# Patient Record
Sex: Female | Born: 1945 | Race: Black or African American | Hispanic: No | Marital: Married | Smoking: Former smoker
Health system: Southern US, Community
[De-identification: ages and names within clinical notes are randomized; demographics above are authoritative.]

## PROBLEM LIST (undated history)

## (undated) DIAGNOSIS — G4700 Insomnia, unspecified: Secondary | ICD-10-CM

## (undated) DIAGNOSIS — M549 Dorsalgia, unspecified: Secondary | ICD-10-CM

## (undated) DIAGNOSIS — I10 Essential (primary) hypertension: Secondary | ICD-10-CM

## (undated) DIAGNOSIS — H9319 Tinnitus, unspecified ear: Secondary | ICD-10-CM

## (undated) DIAGNOSIS — C186 Malignant neoplasm of descending colon: Secondary | ICD-10-CM

## (undated) DIAGNOSIS — J209 Acute bronchitis, unspecified: Secondary | ICD-10-CM

## (undated) DIAGNOSIS — M8580 Other specified disorders of bone density and structure, unspecified site: Secondary | ICD-10-CM

## (undated) DIAGNOSIS — G629 Polyneuropathy, unspecified: Secondary | ICD-10-CM

## (undated) DIAGNOSIS — Z0000 Encounter for general adult medical examination without abnormal findings: Principal | ICD-10-CM

## (undated) DIAGNOSIS — M542 Cervicalgia: Secondary | ICD-10-CM

## (undated) DIAGNOSIS — Z86010 Personal history of colonic polyps: Secondary | ICD-10-CM

## (undated) DIAGNOSIS — F418 Other specified anxiety disorders: Secondary | ICD-10-CM

## (undated) DIAGNOSIS — M1990 Unspecified osteoarthritis, unspecified site: Secondary | ICD-10-CM

## (undated) DIAGNOSIS — D649 Anemia, unspecified: Secondary | ICD-10-CM

## (undated) DIAGNOSIS — T7840XA Allergy, unspecified, initial encounter: Secondary | ICD-10-CM

## (undated) DIAGNOSIS — J069 Acute upper respiratory infection, unspecified: Secondary | ICD-10-CM

## (undated) DIAGNOSIS — G709 Myoneural disorder, unspecified: Secondary | ICD-10-CM

## (undated) DIAGNOSIS — E785 Hyperlipidemia, unspecified: Secondary | ICD-10-CM

## (undated) HISTORY — DX: Unspecified osteoarthritis, unspecified site: M19.90

## (undated) HISTORY — DX: Acute bronchitis, unspecified: J20.9

## (undated) HISTORY — DX: Essential (primary) hypertension: I10

## (undated) HISTORY — DX: Myoneural disorder, unspecified: G70.9

## (undated) HISTORY — DX: Personal history of colonic polyps: Z86.010

## (undated) HISTORY — DX: Hyperlipidemia, unspecified: E78.5

## (undated) HISTORY — DX: Encounter for general adult medical examination without abnormal findings: Z00.00

## (undated) HISTORY — DX: Malignant neoplasm of descending colon: C18.6

## (undated) HISTORY — DX: Cervicalgia: M54.2

## (undated) HISTORY — DX: Acute upper respiratory infection, unspecified: J06.9

## (undated) HISTORY — DX: Anemia, unspecified: D64.9

## (undated) HISTORY — DX: Other specified disorders of bone density and structure, unspecified site: M85.80

## (undated) HISTORY — DX: Allergy, unspecified, initial encounter: T78.40XA

## (undated) HISTORY — DX: Other specified anxiety disorders: F41.8

## (undated) HISTORY — DX: Polyneuropathy, unspecified: G62.9

## (undated) HISTORY — DX: Insomnia, unspecified: G47.00

---

## 1968-07-29 HISTORY — PX: TUBAL LIGATION: SHX77

## 1968-07-29 HISTORY — PX: HEMORRHOID SURGERY: SHX153

## 1972-07-29 HISTORY — PX: VAGINAL HYSTERECTOMY: SUR661

## 1985-07-29 HISTORY — PX: BREAST ENHANCEMENT SURGERY: SHX7

## 1998-05-31 ENCOUNTER — Other Ambulatory Visit: Admission: RE | Admit: 1998-05-31 | Discharge: 1998-05-31 | Payer: Self-pay | Admitting: Obstetrics and Gynecology

## 1999-07-19 ENCOUNTER — Other Ambulatory Visit: Admission: RE | Admit: 1999-07-19 | Discharge: 1999-07-19 | Payer: Self-pay | Admitting: Obstetrics and Gynecology

## 2000-06-23 ENCOUNTER — Encounter: Admission: RE | Admit: 2000-06-23 | Discharge: 2000-06-23 | Payer: Self-pay | Admitting: *Deleted

## 2000-06-23 ENCOUNTER — Encounter: Payer: Self-pay | Admitting: *Deleted

## 2000-07-25 ENCOUNTER — Other Ambulatory Visit: Admission: RE | Admit: 2000-07-25 | Discharge: 2000-07-25 | Payer: Self-pay | Admitting: Obstetrics and Gynecology

## 2001-02-02 ENCOUNTER — Ambulatory Visit (HOSPITAL_BASED_OUTPATIENT_CLINIC_OR_DEPARTMENT_OTHER): Admission: RE | Admit: 2001-02-02 | Discharge: 2001-02-02 | Payer: Self-pay | Admitting: Orthopedic Surgery

## 2001-07-28 ENCOUNTER — Other Ambulatory Visit: Admission: RE | Admit: 2001-07-28 | Discharge: 2001-07-28 | Payer: Self-pay | Admitting: Obstetrics and Gynecology

## 2001-07-29 HISTORY — PX: KNEE ARTHROSCOPY: SUR90

## 2003-05-11 ENCOUNTER — Other Ambulatory Visit: Admission: RE | Admit: 2003-05-11 | Discharge: 2003-05-11 | Payer: Self-pay | Admitting: Obstetrics and Gynecology

## 2004-07-26 ENCOUNTER — Other Ambulatory Visit: Admission: RE | Admit: 2004-07-26 | Discharge: 2004-07-26 | Payer: Self-pay | Admitting: Obstetrics and Gynecology

## 2005-08-05 ENCOUNTER — Other Ambulatory Visit: Admission: RE | Admit: 2005-08-05 | Discharge: 2005-08-05 | Payer: Self-pay | Admitting: Obstetrics and Gynecology

## 2006-08-12 ENCOUNTER — Other Ambulatory Visit: Admission: RE | Admit: 2006-08-12 | Discharge: 2006-08-12 | Payer: Self-pay | Admitting: Obstetrics and Gynecology

## 2007-11-10 ENCOUNTER — Other Ambulatory Visit: Admission: RE | Admit: 2007-11-10 | Discharge: 2007-11-10 | Payer: Self-pay | Admitting: Obstetrics and Gynecology

## 2007-11-17 ENCOUNTER — Ambulatory Visit: Payer: Self-pay | Admitting: Gastroenterology

## 2007-11-23 ENCOUNTER — Telehealth: Payer: Self-pay | Admitting: Gastroenterology

## 2008-03-23 ENCOUNTER — Ambulatory Visit: Payer: Self-pay | Admitting: Gastroenterology

## 2009-04-05 ENCOUNTER — Encounter: Payer: Self-pay | Admitting: Obstetrics and Gynecology

## 2009-04-05 ENCOUNTER — Ambulatory Visit: Payer: Self-pay | Admitting: Obstetrics and Gynecology

## 2009-04-05 ENCOUNTER — Other Ambulatory Visit: Admission: RE | Admit: 2009-04-05 | Discharge: 2009-04-05 | Payer: Self-pay | Admitting: Obstetrics and Gynecology

## 2009-07-29 HISTORY — PX: CATARACT EXTRACTION, BILATERAL: SHX1313

## 2010-08-15 ENCOUNTER — Other Ambulatory Visit: Payer: Self-pay | Admitting: Obstetrics and Gynecology

## 2010-08-15 ENCOUNTER — Ambulatory Visit
Admission: RE | Admit: 2010-08-15 | Discharge: 2010-08-15 | Payer: Self-pay | Source: Home / Self Care | Attending: Obstetrics and Gynecology | Admitting: Obstetrics and Gynecology

## 2010-08-15 ENCOUNTER — Other Ambulatory Visit
Admission: RE | Admit: 2010-08-15 | Discharge: 2010-08-15 | Payer: Self-pay | Source: Home / Self Care | Admitting: Obstetrics and Gynecology

## 2010-09-04 LAB — HM MAMMOGRAPHY: HM Mammogram: NORMAL

## 2010-11-19 ENCOUNTER — Ambulatory Visit (INDEPENDENT_AMBULATORY_CARE_PROVIDER_SITE_OTHER): Payer: 59 | Admitting: Internal Medicine

## 2010-11-19 ENCOUNTER — Encounter: Payer: Self-pay | Admitting: Internal Medicine

## 2010-11-19 VITALS — BP 132/80 | HR 106 | Ht 61.5 in | Wt 188.0 lb

## 2010-11-19 DIAGNOSIS — Z79899 Other long term (current) drug therapy: Secondary | ICD-10-CM

## 2010-11-19 DIAGNOSIS — H532 Diplopia: Secondary | ICD-10-CM

## 2010-11-19 DIAGNOSIS — H02409 Unspecified ptosis of unspecified eyelid: Secondary | ICD-10-CM

## 2010-11-20 ENCOUNTER — Other Ambulatory Visit (INDEPENDENT_AMBULATORY_CARE_PROVIDER_SITE_OTHER): Payer: 59

## 2010-11-20 ENCOUNTER — Other Ambulatory Visit: Payer: Self-pay | Admitting: Internal Medicine

## 2010-11-20 DIAGNOSIS — Z79899 Other long term (current) drug therapy: Secondary | ICD-10-CM

## 2010-11-20 LAB — CREATININE, SERUM: Creatinine, Ser: 1 mg/dL (ref 0.4–1.2)

## 2010-11-20 LAB — BUN: BUN: 16 mg/dL (ref 6–23)

## 2010-11-21 ENCOUNTER — Other Ambulatory Visit: Payer: Self-pay | Admitting: Internal Medicine

## 2010-11-21 ENCOUNTER — Telehealth: Payer: Self-pay

## 2010-11-21 ENCOUNTER — Ambulatory Visit
Admission: RE | Admit: 2010-11-21 | Discharge: 2010-11-21 | Disposition: A | Discharge status: Home / Self Care | Payer: 59 | Source: Ambulatory Visit | Attending: Internal Medicine | Admitting: Internal Medicine

## 2010-11-21 DIAGNOSIS — H532 Diplopia: Secondary | ICD-10-CM

## 2010-11-21 DIAGNOSIS — I639 Cerebral infarction, unspecified: Secondary | ICD-10-CM

## 2010-11-21 MED ORDER — GADOBENATE DIMEGLUMINE 529 MG/ML IV SOLN
17.0000 mL | Freq: Once | INTRAVENOUS | Status: AC | PRN
  Administered 2010-11-21: 17 mL via INTRAVENOUS

## 2010-11-21 NOTE — Telephone Encounter (Signed)
Needs to see neuro asap. That's why we got a work in appt. ?Terri?

## 2010-11-21 NOTE — Telephone Encounter (Signed)
Contacted [facility] and spoke with Dr. Terrace Arabia. Case presentation provided as well as cranial mri results. Neurologist recommended MRA of head and neck as well as echocardiogram. Begin  ECASA 325mg  po qd as well and requests results be faxed to neurology office upon completion to determine appt timing. Contacted pt and discussed results and neurology recommendations. Will begin asa and await scheduling of tests. States diplopia, ptosis, vertigo and eye deviation are stable without change. Has intermittent ha but not currently. No new neurologic sx's. Instructed to present to ED by EMS if develops focal weakness, speech difficulty, numbness/tingling, severe ha or worsening of visual sx's. States understanding and agreement.

## 2010-11-21 NOTE — Telephone Encounter (Signed)
Spoke with pt to notify per Dr. Rodena Medin, the MRI showed no signs that correlate with her current symptoms so she is to keep the appt with Neurology today. Pt notes that she had to reschedule the neurology appt due to scheduling conflict with the MRI appt. She is now on a waiting list with neurology to possibly be seen sooner than her scheduled appt of May 29.   Pt wants to know if she needs to keep her appt to see Dr. Rodena Medin tomorrow, 11/22/2010. Please advise.

## 2010-11-22 ENCOUNTER — Telehealth: Payer: Self-pay | Admitting: Internal Medicine

## 2010-11-22 ENCOUNTER — Ambulatory Visit: Payer: 59 | Admitting: Internal Medicine

## 2010-11-22 NOTE — Telephone Encounter (Signed)
Pt did not keep neurology appt dated 11/20/2009. Contacted neurologist office and they graciously rescheduled pt for today at 10:30am. Pt notified of appt time and date by our office as well as neurology office. Pt did not keep appointment today either. Currently attempting again to reschedule appt for evaluation. Pt left voice mail deferring further neurology appts at this time. Pt fully aware of need for urgent evaluation based on clinic discussion and ~10 minute phone discussion dated 11/20/09. I did indicate the need for further evaluation and concern for possible stroke and other neurologic illnesses. She understood the conversation and had no additional questions. Will continue to attempt neurology evaluation/appt.

## 2010-11-25 ENCOUNTER — Encounter: Payer: Self-pay | Admitting: Internal Medicine

## 2010-11-25 DIAGNOSIS — H02409 Unspecified ptosis of unspecified eyelid: Secondary | ICD-10-CM | POA: Insufficient documentation

## 2010-11-25 NOTE — Assessment & Plan Note (Signed)
With associated diplopia, vertigo and lateral right eye deviation. Otherwise neurologic is nonfocal. Symptoms are stable Schedule work in cranial MRI. Schedule neurology consult. Arrange for close followup in 3 days or sooner if necessary.

## 2010-11-25 NOTE — Progress Notes (Signed)
  Subjective:    Patient ID: Beth Maldonado, female    DOB: 1946-07-15, 65 y.o.   MRN: [id number]  HPI Patient presents to clinic for evaluation of vertigo. Note one and a half week history of nausea associated with vertigo. Denies neurologic deficit such as numbness tingling, difficulty with speech, visual acuity decrease or extremity weakness. Does note double vision for the same amount of time. States presented to urgent care and was given prescription for Biaxin for possible sinusitis. No improvement with this medication. During interview is noted patient has right lid droop and right lateraleye deviation. Patient states this has occurred over the same amount time as her vertigo. No alleviating or exacerbating factors.No other complaints.   Reviewed past medical history, past surgical history, medications, allergies, social history and family history   Review of Systems  Eyes: Positive for visual disturbance. Negative for pain.  Neurological: Positive for dizziness. Negative for tremors, seizures, syncope, speech difficulty, weakness, numbness and headaches.  All other systems reviewed and are negative.       Objective:   Physical Exam    Physical Exam  Vitals reviewed. Constitutional:  appears well-developed and well-nourished. No distress.  HENT:  Head: Normocephalic and atraumatic.  Right Ear: Tympanic membrane, external ear and ear canal normal.  Left Ear: Tympanic membrane, external ear and ear canal normal.  Nose: Nose normal.  Mouth/Throat: Oropharynx is clear and moist. No oropharyngeal exudate.  Eyes: right ptosis and right lateral eye deviation noted. Otherwise EOM grossly intact. Conjunctivae were clear and sclera are nonicteric. Pupils equal round reactive to light. Neck: Neck supple. No thyromegaly present.  Cardiovascular: Normal rate, regular rhythm and normal heart sounds.  Exam reveals no gallop and no friction rub.   No murmur heard. Pulmonary/Chest: Effort  normal and breath sounds normal. No respiratory distress.  has no wheezes.  has no rales.  Lymphadenopathy:   no cervical adenopathy.  Neurological:  is alert.  Skin: Skin is warm and dry.  not diaphoretic.  Psychiatric: normal mood and affect.  Neuro: Cranial nerves II through XII grossly intact. Gait normal.    Assessment & Plan:

## 2010-12-14 NOTE — Op Note (Signed)
[facility]  Patient:    Beth Maldonado, Beth Maldonado                   MRN: [id number] Proc. Date: 02/02/01 Adm. 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  POSTOPERATIVE DIAGNOSES: 1. Right knee medial and lateral meniscal tears. 2. Right knee chondromalacia.  PROCEDURE: 1. Right knee EUA followed by arthroscopic partial medial and lateral    meniscectomies. 2. Right knee chondroplasty.  SURGEON:  Dr. Salvatore Marvel.  ASSISTANT:  Kirstin Tomasa Rand, P.A.  ANESTHESIA:  General.  OPERATIVE TIME:  30 minutes.  COMPLICATIONS:  None.  INDICATIONS FOR PROCEDURE:  Ms. Hollenberg is a 65 year old woman who has had over a year of right knee pain persistent in nature with signs and symptoms consistent with chondromalacia, possible meniscal tear, who has failed conservative care, and is now to undergo arthroscopy.  DESCRIPTION OF PROCEDURE:  Ms. Druck was brought to the operating room on 02/02/01, placed on operative table in the supine position.  After an adequate level of general anesthesia was obtained, her right knee was examined under anesthesia.  She had full range of motion.  The knee was stable to ligamentous exam with normal patella tracking.  The right knee was sterilely injected with 0.25% Marcaine with epinephrine.  The right leg was prepped using sterile Betadine, and draped using sterile technique.  Originally, through an inferolateral portal, an arthroscope with a pump attached was placed into an inferomedial portal, an arthroscopic probe was placed.  On initial inspection of the medial compartment, the articular cartilage, medial femoral condyle, medial tibial plateau showed 25% grade III chondromalacia which was debrided.  Medial meniscus showed a complex tear posteromedial horn of which 50% was resected  back to a stable rim.  Intercondylar notch was inspected.  Anterior and posterior cruciate ligaments were normal.  Lateral compartment inspected.  Articular cartilage, lateral femoral condyle, and lateral tibial plateau showed mild grade I and II chondromalacia.  Lateral meniscus showed a small tear posterolateral inner surface 15% which was resected back to a stable rim.  Patellofemoral joint showed grade III chondromalacia over 50% of the patella which was debrided.  Grade I and II changes in the femoral groove.  The patella tracked normally.  Moderate synovitis in the medial and lateral gutters were debrided, and then the synovial bleeders were cauterized.  Otherwise, they were free of pathology. After this was done, it was felt that all pathology had been satisfactorily addressed.  The instruments were removed.  The portals were closed with 3-0 Nylon suture and injected with 0.25% Marcaine with epinephrine and 4 mg of morphine.  Sterile dressing applied.  The patient was awakened and taken to the recovery room in stable condition.  FOLLOWUP:  Ms. Hebdon will be followed as an outpatient on Vicodin and Celebrex.  See her back in my office in a week for sutures out and followup. DD:  02/02/01 TD:  02/02/01 Job: [id number]

## 2011-02-14 ENCOUNTER — Ambulatory Visit: Payer: 59 | Admitting: Internal Medicine

## 2011-02-19 ENCOUNTER — Encounter: Payer: Self-pay | Admitting: Internal Medicine

## 2011-02-20 ENCOUNTER — Ambulatory Visit (INDEPENDENT_AMBULATORY_CARE_PROVIDER_SITE_OTHER): Payer: 59 | Admitting: Internal Medicine

## 2011-02-20 ENCOUNTER — Encounter: Payer: Self-pay | Admitting: Internal Medicine

## 2011-02-20 DIAGNOSIS — Z79899 Other long term (current) drug therapy: Secondary | ICD-10-CM

## 2011-02-20 DIAGNOSIS — E785 Hyperlipidemia, unspecified: Secondary | ICD-10-CM

## 2011-02-20 DIAGNOSIS — E119 Type 2 diabetes mellitus without complications: Secondary | ICD-10-CM | POA: Insufficient documentation

## 2011-02-20 DIAGNOSIS — I10 Essential (primary) hypertension: Secondary | ICD-10-CM

## 2011-02-20 DIAGNOSIS — G529 Cranial nerve disorder, unspecified: Secondary | ICD-10-CM

## 2011-02-20 LAB — BASIC METABOLIC PANEL
Chloride: 100 mEq/L (ref 96–112)
Creat: 1.12 mg/dL — ABNORMAL HIGH (ref 0.50–1.10)
Potassium: 4.6 mEq/L (ref 3.5–5.3)
Sodium: 139 mEq/L (ref 135–145)

## 2011-02-20 LAB — CBC WITH DIFFERENTIAL/PLATELET
Basophils Absolute: 0.1 10*3/uL (ref 0.0–0.1)
Eosinophils Relative: 10 % — ABNORMAL HIGH (ref 0–5)
Lymphocytes Relative: 27 % (ref 12–46)
Neutro Abs: 6 10*3/uL (ref 1.7–7.7)
Neutrophils Relative %: 57 % (ref 43–77)
Platelets: 667 10*3/uL — ABNORMAL HIGH (ref 150–400)
RBC: 4.27 MIL/uL (ref 3.87–5.11)
RDW: 14.4 % (ref 11.5–15.5)
WBC: 10.6 10*3/uL — ABNORMAL HIGH (ref 4.0–10.5)

## 2011-02-20 LAB — HEPATIC FUNCTION PANEL
ALT: 22 U/L (ref 0–35)
AST: 21 U/L (ref 0–37)
Bilirubin, Direct: 0.1 mg/dL (ref 0.0–0.3)
Total Protein: 7.6 g/dL (ref 6.0–8.3)

## 2011-02-20 LAB — LIPID PANEL
Cholesterol: 209 mg/dL — ABNORMAL HIGH (ref 0–200)
Total CHOL/HDL Ratio: 3.6 Ratio
Triglycerides: 175 mg/dL — ABNORMAL HIGH (ref <150)

## 2011-02-20 LAB — HM COLONOSCOPY

## 2011-02-20 NOTE — Assessment & Plan Note (Signed)
Resolved. Status post negative cranial MRI and neurology evaluation

## 2011-02-20 NOTE — Assessment & Plan Note (Signed)
Obtain fasting lipid profile and liver function tests. 

## 2011-02-20 NOTE — Assessment & Plan Note (Signed)
suboptimal control. Apply dietary modification and begin regular exercise. Obtain CBC, Chem-7 and A1c. Increase Humalog 7525 dose 14 units in the morning 20 units in the evening. Increased dosages 2 units every 4 days until at target. Reviewed in detail the patient states understanding.

## 2011-02-20 NOTE — Patient Instructions (Signed)
Please schedule chem7, a1c 250.0 prior to next visit 

## 2011-02-20 NOTE — Assessment & Plan Note (Signed)
Mildly suboptimal. Recommend out patient blood pressure log to be submitted for review. 

## 2011-02-20 NOTE — Progress Notes (Signed)
  Subjective:    Patient ID: Beth Maldonado, female    DOB: [birth year], 65 y.o.   MRN: [id number]  HPI Patient presents to clinic for evaluation of diabetes mellitus. At last visit had vertigo diplopia and lid droop and right lateral eye deviation. 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 Did ultimately see neurology who agreed with cranial nerve palsy and the symptoms have most recently resolved. His blood sugar control poor with values between 150s and low 200s without hypoglycemia. Morning blood sugars are typically in the 200 range in the p.m. Blood sugars in the 140s range without hypoglycemia. Notes increased stress contributing. Also has been eating at night. Blood pressure mildly elevated and reviewed with patient. No other complaints.  Reviewed past medical history, medications and allergies    Review of Systems see history of present illness     Objective:   Physical Exam    Physical Exam  Vitals reviewed. Constitutional:  appears well-developed and well-nourished. No distress.  HENT:  Head: Normocephalic and atraumatic.  Nose: Nose normal.  Eyes: Conjunctivae and EOM are normal. Pupils are equal, round, and reactive to light. Right eye exhibits no discharge. Left eye exhibits no discharge. No scleral icterus.  Neck: Neck supple. No thyromegaly present.  Cardiovascular: Normal rate, regular rhythm and normal heart sounds.  Exam reveals no gallop and no friction rub.   No murmur heard. Pulmonary/Chest: Effort normal and breath sounds normal. No respiratory distress.  has no wheezes.  has no rales.  Lymphadenopathy:   no cervical adenopathy.  Neurological:  is alert.  Skin: Skin is warm and dry.  not diaphoretic.  Psychiatric: normal mood and affect.      Assessment & Plan:

## 2011-02-21 LAB — MICROALBUMIN / CREATININE URINE RATIO: Microalb, Ur: 0.51 mg/dL (ref 0.00–1.89)

## 2011-02-21 LAB — HEMOGLOBIN A1C: Mean Plasma Glucose: 180 mg/dL — ABNORMAL HIGH (ref <117)

## 2011-03-05 ENCOUNTER — Other Ambulatory Visit: Payer: Self-pay | Admitting: *Deleted

## 2011-03-05 MED ORDER — INSULIN LISPRO PROT & LISPRO (75-25 MIX) 100 UNIT/ML ~~LOC~~ SUSP: SUBCUTANEOUS | Status: DC

## 2011-03-05 MED ORDER — HYDROCHLOROTHIAZIDE 25 MG PO TABS: 25.0000 mg | ORAL_TABLET | Freq: Every day | ORAL | Status: DC

## 2011-03-05 MED ORDER — HYDROXYZINE HCL 25 MG PO TABS: 25.0000 mg | ORAL_TABLET | Freq: Two times a day (BID) | ORAL | Status: DC

## 2011-03-05 MED ORDER — PAROXETINE HCL ER 25 MG PO TB24: 25.0000 mg | ORAL_TABLET | ORAL | Status: DC

## 2011-03-05 MED ORDER — LOSARTAN POTASSIUM 100 MG PO TABS: 100.0000 mg | ORAL_TABLET | Freq: Every day | ORAL | Status: DC

## 2011-03-05 MED ORDER — METFORMIN HCL 500 MG PO TABS: ORAL_TABLET | ORAL | Status: DC

## 2011-03-05 NOTE — Telephone Encounter (Signed)
Rx refills have been sent to pharmacy

## 2011-03-05 NOTE — Telephone Encounter (Signed)
Patient called requesting refills on all of her medications.  Is it okay to refill the medications entered for patient

## 2011-03-05 NOTE — Telephone Encounter (Signed)
ok 

## 2011-03-08 ENCOUNTER — Telehealth: Payer: Self-pay | Admitting: *Deleted

## 2011-03-08 NOTE — Telephone Encounter (Signed)
Voice message received from Lauren at [employer] on behalf of patient. Her message stated the copay for the Paxil will cost the patient $88.00,and the patient is requesting an alternative to something that is less expensive.

## 2011-03-11 MED ORDER — PAROXETINE HCL 10 MG PO TABS: 10.0000 mg | ORAL_TABLET | Freq: Two times a day (BID) | ORAL | Status: DC

## 2011-03-11 MED ORDER — PAROXETINE HCL 20 MG PO TABS: 20.0000 mg | ORAL_TABLET | Freq: Every day | ORAL | Status: DC

## 2011-03-11 NOTE — Telephone Encounter (Signed)
Call placed to patient at [phone], no answer. A detailed voice message was left informing patient of medication change. Message was left for patient to call back if any questions.  Call placed to [facility] at [phone] with  Selena Batten she stated patient does not have any insurance and uses her [facility] card for discount. Kim the pharmacist decided it was more cost effective for the patient to be prescribed 10 mg twice a day. She is aware okay to change Rx to reflect.

## 2011-03-11 NOTE — Telephone Encounter (Signed)
Can try and change to plain paxil 20mg  po qd and see if price better

## 2011-05-06 ENCOUNTER — Telehealth: Payer: Self-pay | Admitting: *Deleted

## 2011-05-06 NOTE — Telephone Encounter (Signed)
Patient called and left voice message stating she has requested diabetic testing supplies for [employer] from a medical supply and wanted to know if Dr. Rodena Medin would approve the request. She also states in her message that the Paxil is not working for her.

## 2011-05-06 NOTE — Telephone Encounter (Signed)
Close alternative to paxil that is affordable would be celexa 40mg  po qd. Available on [facility] $4 list

## 2011-05-06 NOTE — Telephone Encounter (Signed)
Call placed to patient at [phone], she stated she is not sleeping well, she is still having bad night sweats, and she is staying hot. She would like to know if there is something else that she could take other than the Paxil. She states that she is unable to afford the regular or extended release.

## 2011-05-07 NOTE — Telephone Encounter (Signed)
Patient returned call she was advised per Dr Rodena Medin instructions. She stated that she will remain on Paxil and will follow up with Dr Rodena Medin on her office visit on the 26 th of October. She wanted to make sure the Celexa was similar to  Paxil and or Paxil CR.   No medication changes have been made.

## 2011-05-07 NOTE — Telephone Encounter (Signed)
Call placed to patient at [phone] no answer. Voice message left for patient to return phone call.

## 2011-05-09 ENCOUNTER — Telehealth: Payer: Self-pay | Admitting: Internal Medicine

## 2011-05-09 MED ORDER — INSULIN PEN NEEDLE 32G X 4 MM MISC: Status: DC

## 2011-05-09 NOTE — Telephone Encounter (Signed)
Rx for pen needles sent to pharmacy. 

## 2011-05-09 NOTE — Telephone Encounter (Signed)
Pharmacy comments: patient out of needles for humalog mix kwik pen- gets thru mail- can we get rx for pen needles to bill insurance? She bought box of BD ultra fine Nano.  NDC [phone]

## 2011-05-21 ENCOUNTER — Encounter: Payer: Self-pay | Admitting: Internal Medicine

## 2011-05-21 ENCOUNTER — Ambulatory Visit (INDEPENDENT_AMBULATORY_CARE_PROVIDER_SITE_OTHER): Payer: 59 | Admitting: Internal Medicine

## 2011-05-21 DIAGNOSIS — E119 Type 2 diabetes mellitus without complications: Secondary | ICD-10-CM

## 2011-05-21 DIAGNOSIS — F419 Anxiety disorder, unspecified: Secondary | ICD-10-CM

## 2011-05-21 DIAGNOSIS — F411 Generalized anxiety disorder: Secondary | ICD-10-CM

## 2011-05-21 DIAGNOSIS — E785 Hyperlipidemia, unspecified: Secondary | ICD-10-CM

## 2011-05-21 DIAGNOSIS — Z23 Encounter for immunization: Secondary | ICD-10-CM

## 2011-05-21 LAB — LIPID PANEL
HDL: 50 mg/dL (ref >39)
LDL Cholesterol: 98 mg/dL (ref 0–99)
Total CHOL/HDL Ratio: 3.7 Ratio

## 2011-05-21 LAB — BASIC METABOLIC PANEL
BUN: 13 mg/dL (ref 6–23)
Chloride: 104 mEq/L (ref 96–112)
Glucose, Bld: 143 mg/dL — ABNORMAL HIGH (ref 70–99)
Potassium: 4.3 mEq/L (ref 3.5–5.3)

## 2011-05-21 MED ORDER — PAROXETINE HCL 40 MG PO TABS: 40.0000 mg | ORAL_TABLET | Freq: Every day | ORAL | Status: DC

## 2011-05-21 NOTE — Progress Notes (Signed)
  Subjective:    Patient ID: Beth Maldonado, female    DOB: 1945-10-17, 65 y.o.   MRN: [id number]  HPI Pt presents to clinic for followup of multiple medical problems. Has improved diabetic control with typical fsbs 150 or less. Occasionally noted ~69. Cholesterol above goal and deferred statin tx however states if remains elevated will reconsider. paxil not as effective as paxil cr but less costly. Notes decreased sleep and increased hot flashes with paxil. No other complaints.  Past Medical History  Diagnosis Date  . Hypertension   . Diabetes mellitus    Past Surgical History  Procedure Date  . Brain surgery 1987    reduction  . Abdominal hysterectomy 1985    partial  . Cataract extraction, bilateral   . Laparoscopy     rt knee    reports that she has quit smoking. She has never used smokeless tobacco. She reports that she does not drink alcohol or use illicit drugs. family history includes Arthritis in her sisters; Diabetes in her mother and sisters; Heart attack in her father; Sickle cell anemia in her sister; and Thyroid disease in her sister. Allergies  Allergen Reactions  . Penicillins      Review of Systems see hpi    Objective:   Physical Exam  Physical Exam  Nursing note and vitals reviewed. Constitutional: Appears well-developed and well-nourished. No distress.  HENT:  Head: Normocephalic and atraumatic.  Right Ear: External ear normal.  Left Ear: External ear normal.  Eyes: Conjunctivae are normal. No scleral icterus.  Neck: Neck supple. Carotid bruit is not present.  Cardiovascular: Normal rate, regular rhythm and normal heart sounds.  Exam reveals no gallop and no friction rub.   No murmur heard. Pulmonary/Chest: Effort normal and breath sounds normal. No respiratory distress. He has no wheezes. no rales.  Lymphadenopathy:    He has no cervical adenopathy.  Neurological:Alert.  Skin: Skin is warm and dry. Not diaphoretic.  Psychiatric: Has a normal  mood and affect.   Diabetic foot exam: +2 DP pulses, no diabetic wounds, ulcerations or significant callousing. Monofilament exam nl.      Assessment & Plan:

## 2011-05-21 NOTE — Assessment & Plan Note (Signed)
Repeat lipid profile. If ldl remains elevated readdress possible statin tx.

## 2011-05-21 NOTE — Assessment & Plan Note (Signed)
Increase paxil 40mg  po qd.

## 2011-05-21 NOTE — Patient Instructions (Signed)
Please schedule chem7, a1c 250.0 prior to next visit 

## 2011-05-21 NOTE — Assessment & Plan Note (Signed)
Improving control. Obtain chem7 and a1c. 

## 2011-07-09 ENCOUNTER — Other Ambulatory Visit: Payer: Self-pay | Admitting: Internal Medicine

## 2011-07-09 NOTE — Telephone Encounter (Signed)
Refill sent to pharmacy office visit needed for future refills.

## 2011-08-13 ENCOUNTER — Telehealth: Payer: Self-pay | Admitting: Internal Medicine

## 2011-08-13 ENCOUNTER — Telehealth: Payer: Self-pay | Admitting: *Deleted

## 2011-08-13 MED ORDER — METFORMIN HCL 500 MG PO TABS: 1000.0000 mg | ORAL_TABLET | Freq: Two times a day (BID) | ORAL | Status: DC

## 2011-08-13 NOTE — Telephone Encounter (Signed)
Opened in error

## 2011-08-13 NOTE — Telephone Encounter (Signed)
Rx refill sent to pharmacy. Patient due office visit.

## 2011-08-26 ENCOUNTER — Telehealth: Payer: Self-pay | Admitting: Internal Medicine

## 2011-08-26 MED ORDER — INSULIN LISPRO PROT & LISPRO (75-25 MIX) 100 UNIT/ML ~~LOC~~ SUSP: SUBCUTANEOUS | Status: DC

## 2011-08-26 NOTE — Telephone Encounter (Signed)
Rx refill sent to pharmacy. Patient due for office visit 

## 2011-08-27 ENCOUNTER — Other Ambulatory Visit: Payer: Self-pay | Admitting: Internal Medicine

## 2011-08-27 NOTE — Telephone Encounter (Signed)
Refill sent to [facility] on 08/26/11. Current request for humalog quick pen denied. Should have refills on file.

## 2011-08-28 ENCOUNTER — Telehealth: Payer: Self-pay | Admitting: Internal Medicine

## 2011-08-28 NOTE — Telephone Encounter (Signed)
The

## 2011-09-19 ENCOUNTER — Other Ambulatory Visit: Payer: Self-pay | Admitting: Internal Medicine

## 2011-09-19 NOTE — Telephone Encounter (Signed)
Rx refill sent to pharmacy qty of 60 office visit is required for refills. Last office visit July 2012.

## 2011-10-09 ENCOUNTER — Other Ambulatory Visit: Payer: Self-pay | Admitting: Internal Medicine

## 2011-10-09 NOTE — Telephone Encounter (Signed)
Call placed to patient at [phone] she was advised office visit with blood work past due. She has scheduled follow up for November 07, 2011 @ 1:30p with Dr Rodena Medin. Rx refill sent to pharmacy.

## 2011-11-06 ENCOUNTER — Other Ambulatory Visit: Payer: Self-pay | Admitting: *Deleted

## 2011-11-06 DIAGNOSIS — E119 Type 2 diabetes mellitus without complications: Secondary | ICD-10-CM | POA: Diagnosis not present

## 2011-11-07 ENCOUNTER — Ambulatory Visit: Payer: 59 | Admitting: Internal Medicine

## 2011-11-07 DIAGNOSIS — E119 Type 2 diabetes mellitus without complications: Secondary | ICD-10-CM | POA: Diagnosis not present

## 2011-11-07 LAB — HEMOGLOBIN A1C
Hgb A1c MFr Bld: 8 % — ABNORMAL HIGH (ref <5.7)
Mean Plasma Glucose: 183 mg/dL — ABNORMAL HIGH (ref <117)

## 2011-11-07 LAB — BASIC METABOLIC PANEL
Calcium: 9.1 mg/dL (ref 8.4–10.5)
Sodium: 139 mEq/L (ref 135–145)

## 2011-11-08 ENCOUNTER — Telehealth: Payer: Self-pay | Admitting: Internal Medicine

## 2011-11-08 MED ORDER — INSULIN PEN NEEDLE 32G X 4 MM MISC: Status: DC

## 2011-11-08 NOTE — Telephone Encounter (Signed)
Rx refill sent to pharmacy. 

## 2011-11-08 NOTE — Telephone Encounter (Signed)
Refill- BD ultra fine nano 32/4G/MM ea. Use for insulin injection twice a day 250.00. Qty 100 last fill 12.28.12  Pharmacy comments: need ASAP. Patient is out.

## 2011-11-13 ENCOUNTER — Encounter: Payer: Self-pay | Admitting: Internal Medicine

## 2011-11-13 ENCOUNTER — Ambulatory Visit (INDEPENDENT_AMBULATORY_CARE_PROVIDER_SITE_OTHER): Payer: 59 | Admitting: Internal Medicine

## 2011-11-13 VITALS — BP 150/92 | HR 106 | Temp 98.4°F | Ht 61.5 in | Wt 196.0 lb

## 2011-11-13 DIAGNOSIS — W19XXXA Unspecified fall, initial encounter: Secondary | ICD-10-CM

## 2011-11-13 DIAGNOSIS — F411 Generalized anxiety disorder: Secondary | ICD-10-CM

## 2011-11-13 DIAGNOSIS — F419 Anxiety disorder, unspecified: Secondary | ICD-10-CM

## 2011-11-13 DIAGNOSIS — I10 Essential (primary) hypertension: Secondary | ICD-10-CM

## 2011-11-13 DIAGNOSIS — E119 Type 2 diabetes mellitus without complications: Secondary | ICD-10-CM

## 2011-11-13 MED ORDER — PAROXETINE HCL ER 37.5 MG PO TB24: 37.5000 mg | ORAL_TABLET | ORAL | Status: DC

## 2011-11-17 DIAGNOSIS — R296 Repeated falls: Secondary | ICD-10-CM | POA: Insufficient documentation

## 2011-11-17 DIAGNOSIS — W19XXXA Unspecified fall, initial encounter: Secondary | ICD-10-CM | POA: Insufficient documentation

## 2011-11-17 NOTE — Assessment & Plan Note (Signed)
Resume medication. Monitor bp as outpt

## 2011-11-17 NOTE — Progress Notes (Signed)
  Subjective:    Patient ID: Beth Maldonado, female    DOB: [birth year], 66 y.o.   MRN: [id number]  HPI Pt presents to clinic for followup of multiple medical problems. Failing paxil with increasing stress. Also has sweats with paxil but tolerated and had better control of anxiety with paxil cr. Notes intermittent falls without syncope, dizziness, neurologic deficit,  accident, joint instability or other triggers. Poor historian and is unable to elaborate or provide any detail of falls. Blood sugar avg sl improved but suboptimal. Admits to nighttime snacks. fsbs 150-200 generally. No hypoglycemia. BP elevated but off hctz x one month.  Past Medical History  Diagnosis Date  . Hypertension   . Diabetes mellitus    Past Surgical History  Procedure Date  . Brain surgery 1987    reduction  . Abdominal hysterectomy 1985    partial  . Cataract extraction, bilateral   . Laparoscopy     rt knee    reports that she has quit smoking. She has never used smokeless tobacco. She reports that she does not drink alcohol or use illicit drugs. family history includes Arthritis in her sisters; Diabetes in her mother and sisters; Heart attack in her father; Sickle cell anemia in her sister; and Thyroid disease in her sister. Allergies  Allergen Reactions  . Penicillins       Review of Systems see hpi     Objective:   Physical Exam  Physical Exam  Nursing note and vitals reviewed. Constitutional: Appears well-developed and well-nourished. No distress.  HENT:  Head: Normocephalic and atraumatic.  Right Ear: External ear normal.  Left Ear: External ear normal.  Eyes: Conjunctivae are normal. No scleral icterus.  Neck: Neck supple. Carotid bruit is not present.  Cardiovascular: Normal rate, regular rhythm and normal heart sounds.  Exam reveals no gallop and no friction rub.   No murmur heard. Pulmonary/Chest: Effort normal and breath sounds normal. No respiratory distress. He has no wheezes.  no rales.  Lymphadenopathy:    He has no cervical adenopathy.  Neurological:Alert.  Skin: Skin is warm and dry. Not diaphoretic.  Psychiatric: Has a normal mood and affect.        Assessment & Plan:

## 2011-11-17 NOTE — Assessment & Plan Note (Signed)
Failing paxil immediate release. reattempt paxil cr. F/u one month

## 2011-11-17 NOTE — Assessment & Plan Note (Signed)
suboptimal control. Stop nightime snacks. Begin exercise program. Increase pm insulin to 30 units.

## 2011-11-17 NOTE — Assessment & Plan Note (Signed)
Difficult history without obivous etiology. Discussed potential referral and pt declines

## 2011-11-18 ENCOUNTER — Telehealth: Payer: Self-pay | Admitting: Internal Medicine

## 2011-11-18 MED ORDER — LOSARTAN POTASSIUM 100 MG PO TABS: 100.0000 mg | ORAL_TABLET | Freq: Every day | ORAL | Status: DC

## 2011-11-18 NOTE — Telephone Encounter (Signed)
Rx refill sent to pharmacy. 

## 2011-11-18 NOTE — Telephone Encounter (Signed)
Refill-losartan 100mg  tab. Take one tablet(100mg  total) by mouth daily. Qty 30 last fill 3.22.13  Pharmacy comments: cycle fill medication. Authorization is required for next refill. No refills available.

## 2011-11-27 ENCOUNTER — Other Ambulatory Visit: Payer: Self-pay | Admitting: Internal Medicine

## 2011-11-27 NOTE — Telephone Encounter (Signed)
Rx refill sent to pharmacy. 

## 2011-12-10 ENCOUNTER — Ambulatory Visit: Payer: 59 | Admitting: Internal Medicine

## 2011-12-11 ENCOUNTER — Telehealth: Payer: Self-pay | Admitting: Internal Medicine

## 2011-12-11 MED ORDER — HYDROCHLOROTHIAZIDE 25 MG PO TABS: 25.0000 mg | ORAL_TABLET | Freq: Every day | ORAL | Status: DC

## 2011-12-11 MED ORDER — HYDROXYZINE HCL 25 MG PO TABS: 25.0000 mg | ORAL_TABLET | Freq: Two times a day (BID) | ORAL | Status: DC

## 2011-12-11 NOTE — Telephone Encounter (Signed)
Hydroxyzine hcl 25mg  tab. Take one tablet(25mg  total) by mouth two times daily. Qty 60 last fill 4.12.13   Hydrochlorothiazide 25mg  tab. Take one tablet(25mg  total) by mouth daily. Qty 30 last fill 4.12.13

## 2011-12-18 ENCOUNTER — Encounter: Payer: Self-pay | Admitting: Internal Medicine

## 2011-12-18 ENCOUNTER — Ambulatory Visit (INDEPENDENT_AMBULATORY_CARE_PROVIDER_SITE_OTHER): Payer: MEDICARE | Admitting: Internal Medicine

## 2011-12-18 ENCOUNTER — Telehealth: Payer: Self-pay | Admitting: Internal Medicine

## 2011-12-18 VITALS — BP 140/90 | HR 90 | Temp 98.3°F | Resp 16

## 2011-12-18 DIAGNOSIS — F419 Anxiety disorder, unspecified: Secondary | ICD-10-CM

## 2011-12-18 DIAGNOSIS — E785 Hyperlipidemia, unspecified: Secondary | ICD-10-CM

## 2011-12-18 DIAGNOSIS — E119 Type 2 diabetes mellitus without complications: Secondary | ICD-10-CM

## 2011-12-18 DIAGNOSIS — F411 Generalized anxiety disorder: Secondary | ICD-10-CM

## 2011-12-18 NOTE — Telephone Encounter (Signed)
Please schedule fasting labs prior to next visit  Chem7, a1c, urine microalbumin 250.0 and lipid/lft 272.4  Patient has upcoming follow up on 03/26/12. Patient will be going to [facility] lab.

## 2011-12-18 NOTE — Progress Notes (Signed)
  Subjective:    Patient ID: Beth Maldonado, female    DOB: [birth year], 66 y.o.   MRN: [id number]  HPI Pt presents to clinic for follow up of anxiety. Feels improved since change from paxil to paxil cr. No side effects. Feels less hot flashes and is sleeping better despite recent stressors. BP improved and is taking medication on more regular basis. Beginning exercise and decreasing snacks.  Past Medical History  Diagnosis Date  . Hypertension   . Diabetes mellitus    Past Surgical History  Procedure Date  . Brain surgery 1987    reduction  . Abdominal hysterectomy 1985    partial  . Cataract extraction, bilateral   . Laparoscopy     rt knee    reports that she has quit smoking. She has never used smokeless tobacco. She reports that she does not drink alcohol or use illicit drugs. family history includes Arthritis in her sisters; Diabetes in her mother and sisters; Heart attack in her father; Sickle cell anemia in her sister; and Thyroid disease in her sister. Allergies  Allergen Reactions  . Penicillins      Review of Systems see hpi     Objective:   Physical Exam  Nursing note and vitals reviewed. Constitutional: She appears well-developed and well-nourished. No distress.  HENT:  Head: Normocephalic and atraumatic.  Right Ear: External ear normal.  Left Ear: External ear normal.  Eyes: Conjunctivae are normal. No scleral icterus.  Neurological: She is alert.  Skin: She is not diaphoretic.  Psychiatric: She has a normal mood and affect.          Assessment & Plan:

## 2011-12-18 NOTE — Assessment & Plan Note (Signed)
Improved. Continue current paxil cr dosing.

## 2011-12-18 NOTE — Patient Instructions (Signed)
Please schedule fasting labs prior to next visit Chem7, a1c, urine microalbumin 250.0 and lipid/lft-272.4 

## 2011-12-18 NOTE — Telephone Encounter (Signed)
Lab order entered for August 2013. 

## 2011-12-18 NOTE — Assessment & Plan Note (Signed)
Encouraged appropriate diet, regular exercise and wt loss

## 2011-12-26 ENCOUNTER — Other Ambulatory Visit: Payer: Self-pay | Admitting: Internal Medicine

## 2011-12-26 ENCOUNTER — Encounter: Payer: Self-pay | Admitting: Internal Medicine

## 2011-12-26 MED ORDER — LOSARTAN POTASSIUM 100 MG PO TABS: 100.0000 mg | ORAL_TABLET | Freq: Every day | ORAL | Status: DC

## 2011-12-26 MED ORDER — PAROXETINE HCL ER 37.5 MG PO TB24: 37.5000 mg | ORAL_TABLET | ORAL | Status: DC

## 2011-12-26 MED ORDER — HYDROXYZINE HCL 25 MG PO TABS: 25.0000 mg | ORAL_TABLET | Freq: Two times a day (BID) | ORAL | Status: DC

## 2011-12-26 MED ORDER — HYDROCHLOROTHIAZIDE 25 MG PO TABS: 25.0000 mg | ORAL_TABLET | Freq: Every day | ORAL | Status: DC

## 2011-12-26 MED ORDER — METFORMIN HCL 500 MG PO TABS: 500.0000 mg | ORAL_TABLET | Freq: Two times a day (BID) | ORAL | Status: DC

## 2011-12-26 MED ORDER — INSULIN LISPRO PROT & LISPRO (75-25 MIX) 100 UNIT/ML ~~LOC~~ SUSP: SUBCUTANEOUS | Status: DC

## 2012-01-27 ENCOUNTER — Other Ambulatory Visit: Payer: Self-pay | Admitting: Internal Medicine

## 2012-02-03 ENCOUNTER — Telehealth: Payer: Self-pay | Admitting: *Deleted

## 2012-02-03 MED ORDER — METFORMIN HCL 500 MG PO TABS: ORAL_TABLET | ORAL | Status: DC

## 2012-02-03 NOTE — Telephone Encounter (Signed)
Received call from pt stating she received metformin from mail order and it is for 500mg  1 tablet twice a day. Pt states she has been taking 2 tablets twice a day and is requesting that we send refill with corrected dose and quantity. Pt also requests Humalog quick pen instead of vials. I asked pt to call [facility] and find out if they will take the vials back. Pt will call me tomorrow so we can determine how to proceed. Please advise re: metformin dose/directions.

## 2012-02-03 NOTE — Telephone Encounter (Signed)
Ok to change dosing. Pens are more likely to be denied by insurance but can try

## 2012-02-03 NOTE — Telephone Encounter (Signed)
Metformin directions changed and refill sent to [facility]. Pt called back stating she will not be able to return the Humalog vials. Advised pt we can give her some insulin syringes to use for injection until she is due for refill and we will change to quickpen. Pt is agreeable and will come by the office tomorrow for instructions on syringes.

## 2012-02-05 ENCOUNTER — Telehealth: Payer: Self-pay | Admitting: *Deleted

## 2012-02-05 NOTE — Telephone Encounter (Signed)
Received call from pt stating she continues to have multiple hot flashes daily. Has to change clothes 2-3 x daily. Feels irritable. Doesn't feel her current dose of Paxil CR is strong enough.  Please advise.

## 2012-02-05 NOTE — Telephone Encounter (Signed)
Spoke with pt and advised her that we will be unable to accept or buy back the insulin vials that she received but we could provide her with samples while we have them if she is unable to use a syringe. Pt states she does not like needles and it took her a long time to agree to use the insulin pen. Pt is agreeable to pick up kwikpen samples (4 boxes) and will hold on to her vials to use if samples are not available.

## 2012-02-05 NOTE — Telephone Encounter (Signed)
Pt left message yesterday stating she was unable to come in for instruction on use of insulin syringe and is requesting to speak with supervisor re: payment of insulin received. We have samples available in the office now that we can offer pt.  Left message on pt's home # at 8:15am to return my call.

## 2012-02-06 ENCOUNTER — Telehealth: Payer: Self-pay | Admitting: Internal Medicine

## 2012-02-06 MED ORDER — PAROXETINE HCL ER 25 MG PO TB24: 25.0000 mg | ORAL_TABLET | ORAL | Status: DC

## 2012-02-06 NOTE — Telephone Encounter (Signed)
Notified pt, she is agreeable to proceed with additional strength. Rx sent to pharmacy.

## 2012-02-06 NOTE — Telephone Encounter (Signed)
Patient states that she had talked to [place] earlier today. She states that her car is in the shop and she will not make it by here to pick up samples until tomorrow morning. Patient says that she will be here at 8:00.

## 2012-02-06 NOTE — Telephone Encounter (Signed)
If taking paxil cr 37.5mg  qd then can add additional 25mg  CR daily. Would try and avoid anything higher

## 2012-03-03 ENCOUNTER — Telehealth: Payer: Self-pay | Admitting: *Deleted

## 2012-03-03 MED ORDER — INSULIN LISPRO PROT & LISPRO (75-25 MIX) 100 UNIT/ML ~~LOC~~ SUSP: SUBCUTANEOUS | Status: DC

## 2012-03-03 NOTE — Telephone Encounter (Signed)
Received message from pt that she is opening her last box of humalog 75/25 kwik pens and is requesting additional samples. 3 boxes placed in refrigerator for pt to pick up and pt has been notified.

## 2012-03-14 ENCOUNTER — Other Ambulatory Visit: Payer: Self-pay | Admitting: Internal Medicine

## 2012-03-16 NOTE — Telephone Encounter (Signed)
Paxil CR 25 mg request, added to Rx regimen [Paxil 37.5 mg due to hot flashes per [facility]] 07.11.13 #30x0/SLS Please advise.

## 2012-03-17 ENCOUNTER — Other Ambulatory Visit: Payer: Self-pay | Admitting: Internal Medicine

## 2012-03-17 NOTE — Telephone Encounter (Signed)
Denied-both meds already responded to by other means/SLS

## 2012-03-18 ENCOUNTER — Other Ambulatory Visit: Payer: Self-pay | Admitting: Internal Medicine

## 2012-03-26 ENCOUNTER — Encounter: Payer: Self-pay | Admitting: Internal Medicine

## 2012-03-26 ENCOUNTER — Ambulatory Visit (INDEPENDENT_AMBULATORY_CARE_PROVIDER_SITE_OTHER): Payer: 59 | Admitting: Internal Medicine

## 2012-03-26 DIAGNOSIS — I10 Essential (primary) hypertension: Secondary | ICD-10-CM

## 2012-03-26 DIAGNOSIS — E119 Type 2 diabetes mellitus without complications: Secondary | ICD-10-CM | POA: Diagnosis not present

## 2012-03-26 LAB — BASIC METABOLIC PANEL
BUN: 10 mg/dL (ref 6–23)
CO2: 29 mEq/L (ref 19–32)
Chloride: 102 mEq/L (ref 96–112)
Creat: 0.97 mg/dL (ref 0.50–1.10)
Potassium: 4.9 mEq/L (ref 3.5–5.3)

## 2012-03-26 MED ORDER — INSULIN LISPRO PROT & LISPRO (75-25 MIX) 100 UNIT/ML ~~LOC~~ SUSP: SUBCUTANEOUS | Status: DC

## 2012-03-26 MED ORDER — PAROXETINE HCL ER 37.5 MG PO TB24: 37.5000 mg | ORAL_TABLET | ORAL | Status: DC

## 2012-03-26 MED ORDER — AMLODIPINE BESYLATE 5 MG PO TABS: 5.0000 mg | ORAL_TABLET | Freq: Every day | ORAL | Status: DC

## 2012-03-26 NOTE — Patient Instructions (Signed)
Please schedule labs prior to next visit Chem7, a1c, cbc-250.00

## 2012-04-05 NOTE — Progress Notes (Signed)
  Subjective:    Patient ID: Beth Maldonado, female    DOB: 1945-08-05, 66 y.o.   MRN: [id number]  HPI Pt presents to clinic for followup of multiple medical problems. BP reviewed elevated without sx's. Compliant with medication without adverse effect.   Past Medical History  Diagnosis Date  . Hypertension   . Diabetes mellitus    Past Surgical History  Procedure Date  . Brain surgery 1987    reduction  . Abdominal hysterectomy 1985    partial  . Cataract extraction, bilateral   . Laparoscopy     rt knee    reports that she has quit smoking. She has never used smokeless tobacco. She reports that she does not drink alcohol or use illicit drugs. family history includes Arthritis in her sisters; Diabetes in her mother and sisters; Heart attack in her father; Sickle cell anemia in her sister; and Thyroid disease in her sister. Allergies  Allergen Reactions  . Penicillins       Review of Systems see hpi     Objective:   Physical Exam  Physical Exam  Nursing note and vitals reviewed. Constitutional: Appears well-developed and well-nourished. No distress.  HENT:  Head: Normocephalic and atraumatic.  Right Ear: External ear normal.  Left Ear: External ear normal.  Eyes: Conjunctivae are normal. No scleral icterus.  Neck: Neck supple. Carotid bruit is not present.  Cardiovascular: Normal rate, regular rhythm and normal heart sounds.  Exam reveals no gallop and no friction rub.   No murmur heard. Pulmonary/Chest: Effort normal and breath sounds normal. No respiratory distress. He has no wheezes. no rales.  Lymphadenopathy:    He has no cervical adenopathy.  Neurological:Alert.  Skin: Skin is warm and dry. Not diaphoretic.  Psychiatric: Has a normal mood and affect.        Assessment & Plan:

## 2012-04-05 NOTE — Assessment & Plan Note (Signed)
Obtain labs. Encouraged exercise and weight loss.

## 2012-04-05 NOTE — Assessment & Plan Note (Signed)
suboptimal control. Add norvasc 5mg  qd. Monitor bp as outpt and f/u as scheduled.

## 2012-04-07 DIAGNOSIS — Z1231 Encounter for screening mammogram for malignant neoplasm of breast: Secondary | ICD-10-CM | POA: Diagnosis not present

## 2012-04-08 ENCOUNTER — Encounter: Payer: Self-pay | Admitting: Obstetrics and Gynecology

## 2012-04-14 ENCOUNTER — Other Ambulatory Visit: Payer: Self-pay | Admitting: Internal Medicine

## 2012-04-18 ENCOUNTER — Other Ambulatory Visit: Payer: Self-pay | Admitting: Family

## 2012-04-23 ENCOUNTER — Encounter: Payer: Self-pay | Admitting: Gynecology

## 2012-04-23 DIAGNOSIS — M8580 Other specified disorders of bone density and structure, unspecified site: Secondary | ICD-10-CM | POA: Insufficient documentation

## 2012-05-05 ENCOUNTER — Ambulatory Visit (INDEPENDENT_AMBULATORY_CARE_PROVIDER_SITE_OTHER): Payer: MEDICARE | Admitting: Obstetrics and Gynecology

## 2012-05-05 ENCOUNTER — Encounter: Payer: Self-pay | Admitting: Obstetrics and Gynecology

## 2012-05-05 VITALS — BP 140/80 | Ht 62.0 in | Wt 197.0 lb

## 2012-05-05 DIAGNOSIS — Z780 Asymptomatic menopausal state: Secondary | ICD-10-CM

## 2012-05-05 DIAGNOSIS — N952 Postmenopausal atrophic vaginitis: Secondary | ICD-10-CM

## 2012-05-05 DIAGNOSIS — M899 Disorder of bone, unspecified: Secondary | ICD-10-CM | POA: Diagnosis not present

## 2012-05-05 DIAGNOSIS — M8580 Other specified disorders of bone density and structure, unspecified site: Secondary | ICD-10-CM

## 2012-05-05 MED ORDER — ESTRADIOL 0.05 MG/24HR TD PTTW: 1.0000 | MEDICATED_PATCH | TRANSDERMAL | Status: DC

## 2012-05-05 NOTE — Patient Instructions (Signed)
Schedule bone density.    

## 2012-05-05 NOTE — Progress Notes (Signed)
Patient came to see me today with a problem visit. She is having severe hot flashes. She used to be on hormone replacement. She was then switched to Paxil with good results. Now she is having trouble again. Her PCP increase the Paxil but it has not worked. She has been on a higher dose for a month. She is status post vaginal hysterectomy for dysfunctional uterine bleeding. She's never had a normal Pap smear. She also has atrophic vaginitis with vaginal dryness making intercourse painful. She does have osteopenia without an elevated fracture risk. She has not had a bone density since 2008. She has not had a fracture. Her last Pap smear was 2012. She had a normal mammogram this year. She is having no vaginal bleeding. She is having no pelvic pain. She is under treatment for diabetes mellitus, hypertension, and hyperlipidemia.  ROS: 12 system review done. Pertinent positives above. 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 Neck: No masses. Supraclavicular lymph nodes: Not enlarged. Breasts: Examined in both sitting and lying position. Symmetrical without skin changes or masses. Abdomen: Soft no masses guarding or rebound. No hernias. Pelvic: External within normal limits. BUS within normal limits. Vaginal examination shows poor  estrogen effect, no cystocele enterocele or rectocele. Cervix and uterus absent. Adnexa within normal limits. Rectovaginal confirmatory. Extremities within normal limits.  Assessment: #1. Severe menopausal symptoms #2. Atrophic vaginitis #3. Osteopenia  Plan: We will be initiated her estrogen with Vivelle dot patch 0.05 mg. She used to be on 0.075 mg and she will call me if the 0.05 is not enough. She will continue yearly mammograms. She  will scheduled bone density. Pap not done.The new Pap smear guidelines were discussed with the patient.

## 2012-05-11 ENCOUNTER — Encounter: Payer: Self-pay | Admitting: Obstetrics and Gynecology

## 2012-05-19 ENCOUNTER — Telehealth: Payer: Self-pay | Admitting: Internal Medicine

## 2012-05-19 NOTE — Telephone Encounter (Signed)
aller: [name]/Patient; Patient Name: Beth Maldonado; PCP: Marguarite Arbour (Adults only); Best Callback Phone Number: [phone] Patient calling about problems with medication.  Last OV 03/25/2012 and has been cutting back on medications since then.  Was given some Insulin samples from office but out of those.  Supposed to be taking Humalog 75/25 and 15 units in am with 30 units at night.  Has been taking 10 units in the mornings and 24 units at night.  Blood sugars have been climbing higher...FBS was 375 at 9 am today 10/22.  Now out of Insulin.  Told is in donut hole from now until the end of the year - Insulin would be $143 per box  5 Insulin pens.  Needing help to get this in less expensive way.  Blood pressure today is 177/94.  Has extreme thirst and frequent urination.  Taking deep breathing only with fatigue when walking.   Has has chest pressure for longer than 5 minutes in the last hour that bores into back, has pressure across chest and goes down Left arm.  Feels like fist grinding in upper back feels like might come thru chest for 7-8 days.  Triaged in Diabetes Control Problems and Hypertension Diagnosed Guidelines - Disposition Activate EMS 911 now due to chest pain spreading to arm, back lasting 5 minutes or more now and within the last hour.  Pain not associated with taking deep breath.  Agrees to call 911.  Husband is with her, gave direction for care until 911 arrives. Still requesting help with medications - uses [facility]  [phone].

## 2012-05-19 NOTE — Telephone Encounter (Signed)
Pt walk-in to office this afternoon stating that she did not comply with Triage advisement and report to ED for E&A. Reports that she "is feeling better now" and just needs help with her Insulin coverage. Gave patient [2] Humalog 75/25 pens and informed that we would look at some different avenues at finding her help with the cost of medication/SLS

## 2012-05-22 NOTE — Telephone Encounter (Signed)
Spoke w/pharmacist about [facility] [in addition to written Rx] & was informed that it may process but would come back on pharmacy during audit d/t pt having Medicare, will do more research for other options [possibly with other pharmacies]/SLS

## 2012-06-12 ENCOUNTER — Other Ambulatory Visit: Payer: Self-pay | Admitting: *Deleted

## 2012-06-12 DIAGNOSIS — Z79899 Other long term (current) drug therapy: Secondary | ICD-10-CM

## 2012-06-12 DIAGNOSIS — I10 Essential (primary) hypertension: Secondary | ICD-10-CM

## 2012-06-12 DIAGNOSIS — E119 Type 2 diabetes mellitus without complications: Secondary | ICD-10-CM | POA: Diagnosis not present

## 2012-06-12 LAB — CBC WITH DIFFERENTIAL/PLATELET
Basophils Absolute: 0.1 10*3/uL (ref 0.0–0.1)
Eosinophils Absolute: 0.8 10*3/uL — ABNORMAL HIGH (ref 0.0–0.7)
Eosinophils Relative: 8 % — ABNORMAL HIGH (ref 0–5)
MCH: 27.9 pg (ref 26.0–34.0)
MCV: 85.5 fL (ref 78.0–100.0)
Platelets: 684 10*3/uL — ABNORMAL HIGH (ref 150–400)
RDW: 14.2 % (ref 11.5–15.5)
WBC: 10 10*3/uL (ref 4.0–10.5)

## 2012-06-12 LAB — BASIC METABOLIC PANEL
BUN: 13 mg/dL (ref 6–23)
Calcium: 9.6 mg/dL (ref 8.4–10.5)
Glucose, Bld: 176 mg/dL — ABNORMAL HIGH (ref 70–99)

## 2012-06-12 MED ORDER — INSULIN LISPRO PROT & LISPRO (75-25 MIX) 100 UNIT/ML ~~LOC~~ SUSP: SUBCUTANEOUS | Status: DC

## 2012-06-12 NOTE — Progress Notes (Signed)
Lab orders placed & released/SLS 

## 2012-06-12 NOTE — Telephone Encounter (Signed)
Gave pt Samples of Humalog [vials] when in office for labs/SLS

## 2012-06-12 NOTE — Progress Notes (Signed)
SAMPLES GIVEN at walk-in request/SLS

## 2012-06-16 ENCOUNTER — Ambulatory Visit: Payer: 59 | Admitting: Internal Medicine

## 2012-06-18 ENCOUNTER — Ambulatory Visit (INDEPENDENT_AMBULATORY_CARE_PROVIDER_SITE_OTHER): Payer: MEDICARE | Admitting: Internal Medicine

## 2012-06-18 VITALS — BP 142/72 | HR 101 | Temp 98.1°F | Resp 16 | Wt 197.2 lb

## 2012-06-18 DIAGNOSIS — E119 Type 2 diabetes mellitus without complications: Secondary | ICD-10-CM | POA: Diagnosis not present

## 2012-06-18 DIAGNOSIS — F411 Generalized anxiety disorder: Secondary | ICD-10-CM

## 2012-06-18 DIAGNOSIS — F419 Anxiety disorder, unspecified: Secondary | ICD-10-CM

## 2012-06-18 DIAGNOSIS — J069 Acute upper respiratory infection, unspecified: Secondary | ICD-10-CM | POA: Diagnosis not present

## 2012-06-18 DIAGNOSIS — Z23 Encounter for immunization: Secondary | ICD-10-CM | POA: Diagnosis not present

## 2012-06-18 NOTE — Assessment & Plan Note (Signed)
Suboptimal and worsening control. Reminded patient need for regular use of her medications especially her insulin. Provided with sample Pensof 70/30 insulin. States she has to insulin vials at home but has not been using them. Written prescription provided for syringes and needles.

## 2012-06-18 NOTE — Assessment & Plan Note (Signed)
Patient not interested in immediate release Paxil. Paxil CR is proving to be costly and interfering with her ability to obtain her other medications. No samples available.

## 2012-06-18 NOTE — Patient Instructions (Signed)
Please schedule fasting labs prior to next visit Cbc, ferritin, b12-anemia, chem7, a1c, lipid, urine microalbumin-250.00

## 2012-06-18 NOTE — Assessment & Plan Note (Signed)
Currently suspect viral etiology. Followup if no improvement after total duration of 8-10 days.

## 2012-06-18 NOTE — Progress Notes (Signed)
  Subjective:    Patient ID: Beth Maldonado, female    DOB: [birth year], 66 y.o.   MRN: [id number]  HPI Pt presents to clinic for followup of multiple medical problems. Patient states she's had difficulty affording her medications primarily because she takes Paxil CR and the dosing cost her $120 a month. Declines Paxil immediate release that she has attempted this in the past without success. As a result of the total culture medication she has not been taking her insulin regularly. Has not begun Norvasc for blood pressure either. A1c reviewed elevated 8.5. States that generally finger stick blood sugars have peaked in the low 200s. No hypoglycemia. Also notes today history of chest congestion cough productive for green sputum without hemoptysis. No shortness of breath or wheezing.  . Past Medical History  Diagnosis Date  . Osteopenia   . Diabetes mellitus   . Hypertension    Past Surgical History  Procedure Date  . Cataract extraction, bilateral   . Laparoscopy     rt knee  . Breast surgery     Reduction  . Vaginal hysterectomy     partial  . Tubal ligation   . Hemorrhoid surgery     reports that she has quit smoking. She has never used smokeless tobacco. She reports that she drinks alcohol. She reports that she does not use illicit drugs. family history includes Arthritis in her sisters; Diabetes in her mother and sisters; Heart attack in her father; Heart disease in her father; Hypertension in her sister; Sickle cell anemia in her sister; and Thyroid disease in her sister. Allergies  Allergen Reactions  . Penicillins       Review of Systems see history of present illness     Objective:   Physical Exam  Physical Exam  Nursing note and vitals reviewed. Constitutional: Appears well-developed and well-nourished. No distress.  HENT:  Head: Normocephalic and atraumatic.  Right Ear: External ear normal.  Left Ear: External ear normal.  Eyes: Conjunctivae are normal. No  scleral icterus.  Neck: Neck supple. Carotid bruit is not present.  Cardiovascular: Normal rate, regular rhythm and normal heart sounds.  Exam reveals no gallop and no friction rub.   No murmur heard. Pulmonary/Chest: Effort normal and breath sounds normal. No respiratory distress. He has no wheezes. no rales.  Lymphadenopathy:    He has no cervical adenopathy.  Neurological:Alert.  Skin: Skin is warm and dry. Not diaphoretic.  Psychiatric: Has a normal mood and affect.        Assessment & Plan:

## 2012-06-20 ENCOUNTER — Other Ambulatory Visit: Payer: Self-pay | Admitting: Internal Medicine

## 2012-06-22 NOTE — Telephone Encounter (Signed)
Rx to pharmacy/SLS 

## 2012-06-23 ENCOUNTER — Telehealth: Payer: Self-pay | Admitting: *Deleted

## 2012-06-23 MED ORDER — AZITHROMYCIN 250 MG PO TABS: ORAL_TABLET | ORAL | Status: DC

## 2012-06-23 NOTE — Telephone Encounter (Signed)
Pt reports she was seen in office 11.21.13 & at that time declined ABX px, with discussion that she would call back if no better; pt reports all symptoms are worsening & mucus has become dark green and is now requesting ABX to pharmacy/SLS Please advise.

## 2012-06-23 NOTE — Telephone Encounter (Signed)
zpak as directed #1 if not allergic or interactions

## 2012-06-23 NOTE — Telephone Encounter (Signed)
Rx to pharmacy; pt informed/SLS 

## 2012-06-24 ENCOUNTER — Other Ambulatory Visit: Payer: Self-pay | Admitting: Internal Medicine

## 2012-06-24 NOTE — Telephone Encounter (Signed)
Short term supply only; pt has available refills via [facility]/SLS

## 2012-07-24 ENCOUNTER — Other Ambulatory Visit: Payer: Self-pay | Admitting: Internal Medicine

## 2012-07-24 NOTE — Telephone Encounter (Signed)
DENIED-Pt given short-term supply to local pharmacy 11.27.13-PATIENT HAS AVAILABLE REFILLS VIA [facility]/SLS

## 2012-07-25 ENCOUNTER — Other Ambulatory Visit: Payer: Self-pay | Admitting: Family

## 2012-07-25 ENCOUNTER — Other Ambulatory Visit: Payer: Self-pay | Admitting: Internal Medicine

## 2012-07-27 NOTE — Telephone Encounter (Signed)
Metformin denied as there should still be refills on file from 06/20/12; #120 x 3 refills. Hydroxyzine refill #60 x no refills sent.

## 2012-07-27 NOTE — Telephone Encounter (Signed)
Pt states she has been taking Vivelle 0.05mg  patch twice a week in addition to Paxil. Reports that hot flashes are much worse since restarting the patch. States that she is miserable as her clothes stay soaked almost constantly, can't wear a coat anymore. Still has a lot of sweating during the night and has increased vaginal discharge. Pt states her GYN is retiring at the end of this year and she would like your recommendations. She is holding off on refill of Paxil 25mg  until further instructions from you. Reports that she has been considering going to a holistic doctor for treatment.  Please advise.

## 2012-07-27 NOTE — Telephone Encounter (Signed)
Wouldn't recommend stopping paxil suddenly. Can feel bad. Also would recommend Dr. Candis Schatz good things about her. Focuses on women's health and hormones

## 2012-07-28 NOTE — Telephone Encounter (Signed)
Patient informed, understood & agreed. Rx to pharmacy/SLS  

## 2012-07-30 ENCOUNTER — Telehealth: Payer: Self-pay | Admitting: Obstetrics and Gynecology

## 2012-07-30 NOTE — Telephone Encounter (Signed)
Was in Oct 8th to see Dr. Reece Agar and got Rx for estrogen patch.  Patient said it is not helping her symptoms.  She said this "call would be for Harriett Sine".   Do you want me to have her schedule office visit?

## 2012-07-30 NOTE — Telephone Encounter (Signed)
Telephone call to review hot flushes. States has been on the estradiol 0.05 patch with continued hot flushes. Also is a diabetic on insulin twice daily. Reviewed importance of blood sugar control in relationship to hot flushes. States is trying to get blood sugars under better control with exercise and diet. Will hold off on changing estrogen at this time and work better on diabetes control.

## 2012-08-23 ENCOUNTER — Other Ambulatory Visit: Payer: Self-pay | Admitting: Internal Medicine

## 2012-08-31 ENCOUNTER — Other Ambulatory Visit: Payer: Self-pay | Admitting: Internal Medicine

## 2012-09-08 ENCOUNTER — Other Ambulatory Visit: Payer: Self-pay | Admitting: Family Medicine

## 2012-09-15 ENCOUNTER — Ambulatory Visit: Payer: MEDICARE | Admitting: Internal Medicine

## 2012-09-22 ENCOUNTER — Encounter: Payer: Self-pay | Admitting: Family Medicine

## 2012-09-22 ENCOUNTER — Telehealth: Payer: Self-pay | Admitting: Family Medicine

## 2012-09-22 ENCOUNTER — Ambulatory Visit (INDEPENDENT_AMBULATORY_CARE_PROVIDER_SITE_OTHER): Payer: MEDICARE | Admitting: Family Medicine

## 2012-09-22 VITALS — BP 136/84 | HR 94 | Temp 98.2°F | Ht 61.5 in | Wt 197.0 lb

## 2012-09-22 DIAGNOSIS — E119 Type 2 diabetes mellitus without complications: Secondary | ICD-10-CM | POA: Diagnosis not present

## 2012-09-22 DIAGNOSIS — E785 Hyperlipidemia, unspecified: Secondary | ICD-10-CM

## 2012-09-22 DIAGNOSIS — I10 Essential (primary) hypertension: Secondary | ICD-10-CM

## 2012-09-22 DIAGNOSIS — G609 Hereditary and idiopathic neuropathy, unspecified: Secondary | ICD-10-CM

## 2012-09-22 DIAGNOSIS — D649 Anemia, unspecified: Secondary | ICD-10-CM | POA: Diagnosis not present

## 2012-09-22 LAB — LIPID PANEL
Cholesterol: 163 mg/dL (ref 0–200)
HDL: 52 mg/dL (ref >39)
LDL Cholesterol: 77 mg/dL (ref 0–99)
Triglycerides: 171 mg/dL — ABNORMAL HIGH (ref <150)
VLDL: 34 mg/dL (ref 0–40)

## 2012-09-22 LAB — BASIC METABOLIC PANEL
BUN: 11 mg/dL (ref 6–23)
CO2: 27 mEq/L (ref 19–32)
Calcium: 9.6 mg/dL (ref 8.4–10.5)
Creat: 0.99 mg/dL (ref 0.50–1.10)

## 2012-09-22 LAB — CBC
Hemoglobin: 11.2 g/dL — ABNORMAL LOW (ref 12.0–15.0)
MCH: 27.8 pg (ref 26.0–34.0)
MCV: 88.1 fL (ref 78.0–100.0)
RBC: 4.03 MIL/uL (ref 3.87–5.11)
WBC: 10.2 10*3/uL (ref 4.0–10.5)

## 2012-09-22 MED ORDER — GABAPENTIN 100 MG PO CAPS: ORAL_CAPSULE | ORAL | Status: DC

## 2012-09-22 MED ORDER — KRILL OIL PO CAPS: ORAL_CAPSULE | ORAL | Status: DC

## 2012-09-22 NOTE — Telephone Encounter (Signed)
Patient came in today for labs. Labs ordered according to Dr Ty Hilts last office visit note on 06-18-12.  I also ordered labs for May

## 2012-09-22 NOTE — Telephone Encounter (Signed)
Labs prior to visit, lipid, renal, hepatic, tsh, cbc, hgba1c  Patient has upcoming appointment at the end of may. She will be going to [facility] lab

## 2012-09-22 NOTE — Patient Instructions (Addendum)
  Labs prior to visit, lipid, renal, hepatic, tsh, cbc, hgba1c   Peripheral Neuropathy Peripheral neuropathy is a common disorder of your nerves resulting from damage. CAUSES  This disorder may be caused by a disease of the nerves or illness. Many neuropathies have well known causes such as:  Diabetes. This is one of the most common causes.   Uremia.   AIDS.   Nutritional deficiencies.   Other causes include mechanical pressures. These may be from:   Compression.   Injury.   Contusions or bruises.   Fracture or dislocated bones.   Pressure involving the nerves close to the surface. Nerves such as the ulnar, or radial can be injured by prolonged use of crutches.  Other injuries may come from:  Tumor.   Hemorrhage or bleeding into a nerve.   Exposure to cold or radiation.   Certain medicines or toxic substances (rare).   Vascular or collagen disorders such as:   Atherosclerosis.   Systemic lupus erythematosus.   Scleroderma.   Sarcoidosis.   Rheumatoid arthritis.   Polyarteritis nodosa.   A large number of cases are of unknown cause.  SYMPTOMS  Common problems include:  Weakness.   Numbness.   Abnormal sensations (paresthesia) such as:   Burning.   Tickling.   Pricking.   Tingling.   Pain in the arms, hands, legs and/or feet.  TREATMENT  Therapy for this disorder differs depending on the cause. It may vary from medical treatment with medications or physical therapy among others.   For example, therapy for this disorder caused by diabetes involves control of the diabetes.   In cases where a tumor or ruptured disc is the cause, therapy may involve surgery. This would be to remove the tumor or to repair the ruptured disc.   In entrapment or compression neuropathy, treatment may consist of splinting or surgical decompression of the ulnar or median nerves. A common example of entrapment neuropathy is carpal tunnel syndrome. This has become more  common because of the increasing use of computers.   Peroneal and radial compression neuropathies may require avoidance of pressure.   Physical therapy and/or splints may be useful in preventing contractures. This is a condition in which shortened muscles around joints cause abnormal and sometimes painful positioning of the joints.  Document Released: 07/05/2002 Document Revised: 03/27/2011 Document Reviewed: 07/15/2005 [facility] Patient Information 2012 [place], [place].

## 2012-09-27 ENCOUNTER — Encounter: Payer: Self-pay | Admitting: Family Medicine

## 2012-09-27 NOTE — Progress Notes (Signed)
Patient ID: Beth Maldonado, female   DOB: [birth year], 67 y.o.   MRN: [id number] Beth Maldonado [id number] Apr 14, 1946 09/27/2012      Progress Note New Patient  Subjective  Chief Complaint  Chief Complaint  Patient presents with  . Follow-up    3 month    HPI  Patient is a 67 year old AA female in today for followup. Notes blood sugars above 300 only after eating. Fasting 100-150 complaints of tingling in her feet in the evenings. No headache, chest pain, palpitations, shortness of breath, GI or GU complaints. No congestion, cough, reflux. Has not been following a diabetic diet closely  Past Medical History  Diagnosis Date  . Osteopenia   . Diabetes mellitus   . Hypertension     Past Surgical History  Procedure Laterality Date  . Cataract extraction, bilateral    . Laparoscopy      rt knee  . Breast surgery      Reduction  . Vaginal hysterectomy      partial  . Tubal ligation    . Hemorrhoid surgery      Family History  Problem Relation Age of Onset  . Diabetes Mother   . Heart attack Father   . Heart disease Father   . Diabetes Sister   . Sickle cell anemia Sister   . Arthritis Sister   . Hypertension Sister   . Diabetes Sister   . Thyroid disease Sister   . Arthritis Sister     History   Social History  . Marital Status: Married    Spouse Name: N/A    Number of Children: N/A  . Years of Education: N/A   Occupational History  . Not on file.   Social History Main Topics  . Smoking status: Former Games developer  . Smokeless tobacco: Never Used  . Alcohol Use: Yes     Comment: Rare  . Drug Use: No  . Sexually Active: No   Other Topics Concern  . Not on file   Social History Narrative  . No narrative on file    Current Outpatient Prescriptions on File Prior to Visit  Medication Sig Dispense Refill  . amLODipine (NORVASC) 5 MG tablet Take 1 tablet (5 mg total) by mouth daily.  30 tablet  6  . aspirin 81 MG tablet Take 81 mg by mouth daily.         . hydrochlorothiazide (HYDRODIURIL) 25 MG tablet TAKE 1 TABLET (25 MG TOTAL) BY MOUTH DAILY.  30 tablet  5  . hydrOXYzine (ATARAX/VISTARIL) 25 MG tablet TAKE 1 TABLET (25 MG TOTAL) BY MOUTH 2 (TWO) TIMES DAILY.  60 tablet  2  . Insulin Pen Needle 32G X 4 MM MISC Use for insulin injection twice a day 250.00  100 each  2  . losartan (COZAAR) 100 MG tablet TAKE 1 TABLET (100 MG TOTAL) BY MOUTH DAILY.  30 tablet  5  . metFORMIN (GLUCOPHAGE) 500 MG tablet TAKE 2 TABLETS (1,000 MG TOTAL) BY MOUTH 2 (TWO) TIMES DAILY WITH A MEAL.  120 tablet  3  . PARoxetine (PAXIL CR) 37.5 MG 24 hr tablet Take 1 tablet (37.5 mg total) by mouth every morning.  90 tablet  2  . PARoxetine (PAXIL-CR) 25 MG 24 hr tablet TAKE 1 TABLET (25 MG TOTAL) BY MOUTH EVERY MORNING.  30 tablet  1  . pyridOXINE (VITAMIN B-6) 100 MG tablet Take 100 mg by mouth daily.         No current  facility-administered medications on file prior to visit.    Allergies  Allergen Reactions  . Penicillins     Review of Systems  Review of Systems  Constitutional: Negative for fever and malaise/fatigue.  HENT: Negative for congestion.   Eyes: Negative for discharge.  Respiratory: Negative for shortness of breath.   Cardiovascular: Negative for chest pain, palpitations and leg swelling.  Gastrointestinal: Negative for nausea, abdominal pain and diarrhea.  Genitourinary: Negative for dysuria.  Musculoskeletal: Negative for falls.  Skin: Negative for rash.  Neurological: Positive for tingling. Negative for loss of consciousness and headaches.       In feet at night.  Endo/Heme/Allergies: Negative for polydipsia.  Psychiatric/Behavioral: Negative for depression and suicidal ideas. The patient is not nervous/anxious and does not have insomnia.     Objective  BP 136/84  Pulse 94  Temp(Src) 98.2 F (36.8 C) (Oral)  Ht 5' 1.5" (1.562 m)  Wt 197 lb (89.359 kg)  BMI 36.62 kg/m2  SpO2 97%  Physical Exam  Physical Exam   Constitutional: She is oriented to person, place, and time and well-developed, well-nourished, and in no distress. No distress.  HENT:  Head: Normocephalic and atraumatic.  Right Ear: External ear normal.  Left Ear: External ear normal.  Nose: Nose normal.  Mouth/Throat: Oropharynx is clear and moist. No oropharyngeal exudate.  Eyes: Conjunctivae are normal. Pupils are equal, round, and reactive to light. Right eye exhibits no discharge. Left eye exhibits no discharge. No scleral icterus.  Neck: Normal range of motion. Neck supple. No thyromegaly present.  Cardiovascular: Normal rate, regular rhythm, normal heart sounds and intact distal pulses.   No murmur heard. Pulmonary/Chest: Effort normal and breath sounds normal. No respiratory distress. She has no wheezes. She has no rales.  Abdominal: Soft. Bowel sounds are normal. She exhibits no distension and no mass. There is no tenderness.  Musculoskeletal: Normal range of motion. She exhibits no edema and no tenderness.  Lymphadenopathy:    She has no cervical adenopathy.  Neurological: She is alert and oriented to person, place, and time. She has normal reflexes. No cranial nerve deficit. Coordination normal.  Skin: Skin is warm and dry. No rash noted. She is not diaphoretic.  Psychiatric: Mood, memory and affect normal.       Assessment & Plan  Diabetes mellitus Fasting sugars generally 100 to 150 and after eating in the 300s, encouraged minimize simple sugars and no change in meds now.   Hypertension Well controlled no changes  Hyperlipidemia Encouraged MegaRed caps daily, avoid trans fats. Continue to monitor

## 2012-09-27 NOTE — Assessment & Plan Note (Signed)
Well controlled no changes 

## 2012-09-27 NOTE — Assessment & Plan Note (Signed)
Encouraged MegaRed caps daily, avoid trans fats. Continue to monitor

## 2012-09-27 NOTE — Assessment & Plan Note (Signed)
Fasting sugars generally 100 to 150 and after eating in the 300s, encouraged minimize simple sugars and no change in meds now.

## 2012-09-28 ENCOUNTER — Other Ambulatory Visit: Payer: Self-pay | Admitting: Internal Medicine

## 2012-09-28 ENCOUNTER — Telehealth: Payer: Self-pay | Admitting: Family Medicine

## 2012-09-28 NOTE — Telephone Encounter (Signed)
Refill- metformin hcl 500mg  tab. Take two tablets(1,000mg  total) by mouth two times daily with a meal. Qty 60 last fill 3.3.14

## 2012-09-29 MED ORDER — METFORMIN HCL 500 MG PO TABS: 1000.0000 mg | ORAL_TABLET | Freq: Two times a day (BID) | ORAL | Status: DC

## 2012-10-05 ENCOUNTER — Other Ambulatory Visit: Payer: Self-pay | Admitting: Internal Medicine

## 2012-10-25 ENCOUNTER — Other Ambulatory Visit: Payer: Self-pay | Admitting: Internal Medicine

## 2012-10-26 NOTE — Telephone Encounter (Signed)
Denial-Last Rx 03.04.14 #120x3/SLS

## 2012-11-27 ENCOUNTER — Telehealth: Payer: Self-pay | Admitting: Family Medicine

## 2012-11-27 MED ORDER — METFORMIN HCL 500 MG PO TABS: 1000.0000 mg | ORAL_TABLET | Freq: Two times a day (BID) | ORAL | Status: DC

## 2012-11-27 NOTE — Telephone Encounter (Signed)
Refill- metformin 500mg  tab. Take two tablets by mouth twice daily with meals. Qty 120 last fill 4.1.14

## 2012-12-15 ENCOUNTER — Other Ambulatory Visit: Payer: Self-pay | Admitting: Family Medicine

## 2012-12-15 DIAGNOSIS — E785 Hyperlipidemia, unspecified: Secondary | ICD-10-CM | POA: Diagnosis not present

## 2012-12-15 DIAGNOSIS — E119 Type 2 diabetes mellitus without complications: Secondary | ICD-10-CM | POA: Diagnosis not present

## 2012-12-15 DIAGNOSIS — I10 Essential (primary) hypertension: Secondary | ICD-10-CM | POA: Diagnosis not present

## 2012-12-15 DIAGNOSIS — D649 Anemia, unspecified: Secondary | ICD-10-CM | POA: Diagnosis not present

## 2012-12-16 LAB — LIPID PANEL
HDL: 55 mg/dL (ref >39)
LDL Cholesterol: 74 mg/dL (ref 0–99)
Total CHOL/HDL Ratio: 2.8 Ratio
Triglycerides: 129 mg/dL (ref <150)

## 2012-12-16 LAB — HEPATIC FUNCTION PANEL
ALT: 20 U/L (ref 0–35)
AST: 20 U/L (ref 0–37)
Albumin: 4.4 g/dL (ref 3.5–5.2)
Bilirubin, Direct: 0.1 mg/dL (ref 0.0–0.3)
Total Bilirubin: 0.7 mg/dL (ref 0.3–1.2)

## 2012-12-16 LAB — CBC
HCT: 35.7 % — ABNORMAL LOW (ref 36.0–46.0)
Hemoglobin: 11.6 g/dL — ABNORMAL LOW (ref 12.0–15.0)
MCV: 85.8 fL (ref 78.0–100.0)
RBC: 4.16 MIL/uL (ref 3.87–5.11)
WBC: 8 10*3/uL (ref 4.0–10.5)

## 2012-12-16 LAB — RENAL FUNCTION PANEL
Albumin: 4.4 g/dL (ref 3.5–5.2)
Calcium: 9.5 mg/dL (ref 8.4–10.5)
Creat: 1.02 mg/dL (ref 0.50–1.10)
Sodium: 139 mEq/L (ref 135–145)

## 2012-12-16 LAB — TSH: TSH: 1.593 u[IU]/mL (ref 0.350–4.500)

## 2012-12-16 LAB — HEMOGLOBIN A1C: Hgb A1c MFr Bld: 8 % — ABNORMAL HIGH (ref <5.7)

## 2012-12-24 ENCOUNTER — Ambulatory Visit (INDEPENDENT_AMBULATORY_CARE_PROVIDER_SITE_OTHER): Payer: MEDICARE | Admitting: Family Medicine

## 2012-12-24 ENCOUNTER — Encounter: Payer: Self-pay | Admitting: Family Medicine

## 2012-12-24 ENCOUNTER — Other Ambulatory Visit: Payer: Self-pay | Admitting: Internal Medicine

## 2012-12-24 VITALS — BP 138/78 | HR 100 | Temp 98.5°F | Ht 61.5 in | Wt 195.1 lb

## 2012-12-24 DIAGNOSIS — G609 Hereditary and idiopathic neuropathy, unspecified: Secondary | ICD-10-CM

## 2012-12-24 DIAGNOSIS — E119 Type 2 diabetes mellitus without complications: Secondary | ICD-10-CM

## 2012-12-24 DIAGNOSIS — I10 Essential (primary) hypertension: Secondary | ICD-10-CM

## 2012-12-24 DIAGNOSIS — E785 Hyperlipidemia, unspecified: Secondary | ICD-10-CM

## 2012-12-24 DIAGNOSIS — F411 Generalized anxiety disorder: Secondary | ICD-10-CM

## 2012-12-24 DIAGNOSIS — F419 Anxiety disorder, unspecified: Secondary | ICD-10-CM

## 2012-12-24 MED ORDER — GABAPENTIN 300 MG PO CAPS: ORAL_CAPSULE | ORAL | Status: DC

## 2012-12-24 NOTE — Patient Instructions (Addendum)
Rel of rec Dr Billie Ruddy, eye doctor, last visit   Peripheral Neuropathy Peripheral neuropathy is a common disorder of your nerves resulting from damage. CAUSES  This disorder may be caused by a disease of the nerves or illness. Many neuropathies have well known causes such as:  Diabetes. This is one of the most common causes.   Uremia.   AIDS.   Nutritional deficiencies.   Other causes include mechanical pressures. These may be from:   Compression.   Injury.   Contusions or bruises.   Fracture or dislocated bones.   Pressure involving the nerves close to the surface. Nerves such as the ulnar, or radial can be injured by prolonged use of crutches.  Other injuries may come from:  Tumor.   Hemorrhage or bleeding into a nerve.   Exposure to cold or radiation.   Certain medicines or toxic substances (rare).   Vascular or collagen disorders such as:   Atherosclerosis.   Systemic lupus erythematosus.   Scleroderma.   Sarcoidosis.   Rheumatoid arthritis.   Polyarteritis nodosa.   A large number of cases are of unknown cause.  SYMPTOMS  Common problems include:  Weakness.   Numbness.   Abnormal sensations (paresthesia) such as:   Burning.   Tickling.   Pricking.   Tingling.   Pain in the arms, hands, legs and/or feet.  TREATMENT  Therapy for this disorder differs depending on the cause. It may vary from medical treatment with medications or physical therapy among others.   For example, therapy for this disorder caused by diabetes involves control of the diabetes.   In cases where a tumor or ruptured disc is the cause, therapy may involve surgery. This would be to remove the tumor or to repair the ruptured disc.   In entrapment or compression neuropathy, treatment may consist of splinting or surgical decompression of the ulnar or median nerves. A common example of entrapment neuropathy is carpal tunnel syndrome. This has become more common  because of the increasing use of computers.   Peroneal and radial compression neuropathies may require avoidance of pressure.   Physical therapy and/or splints may be useful in preventing contractures. This is a condition in which shortened muscles around joints cause abnormal and sometimes painful positioning of the joints.  Document Released: 07/05/2002 Document Revised: 03/27/2011 Document Reviewed: 07/15/2005 [facility] Patient Information 2012 [place], [place].

## 2012-12-27 ENCOUNTER — Encounter: Payer: Self-pay | Admitting: Family Medicine

## 2012-12-27 NOTE — Assessment & Plan Note (Signed)
Avoid trans fats, start krill oil caps, encouraged to start a statin, patient hesitant but will consider after trying diet and exercise for 3 more months

## 2012-12-27 NOTE — Assessment & Plan Note (Signed)
Well controlled no changes 

## 2012-12-27 NOTE — Progress Notes (Signed)
Patient ID: Beth Maldonado, female   DOB: 1945-09-25, 67 y.o.   MRN: [id number] KYLLIE PETTIJOHN [id number] 12/26/1945 12/27/2012      Progress Note-Follow Up  Subjective  Chief Complaint  Chief Complaint  Patient presents with  . Follow-up    3 month    HPI  Patient is a 67 year old female who is in today for followup. She continues to struggle with some peripheral neuropathy and discomfort in her feet but otherwise denies any acute complaints. Her anxiety is on Paxil. Her blood sugars have ranged from 32-300. She is known to sometimes skipping meals and notes that her sugars her lowest. She does not routinely follow a diabetic diet. Denies polyuria or polydipsia. No fevers or chills no chest pain, palpitations, shortness of breath, GI or GU complaints  Past Medical History  Diagnosis Date  . Osteopenia   . Diabetes mellitus   . Hypertension     Past Surgical History  Procedure Laterality Date  . Cataract extraction, bilateral    . Laparoscopy      rt knee  . Breast surgery      Reduction  . Vaginal hysterectomy      partial  . Tubal ligation    . Hemorrhoid surgery      Family History  Problem Relation Age of Onset  . Diabetes Mother   . Heart attack Father   . Heart disease Father   . Diabetes Sister   . Sickle cell anemia Sister   . Arthritis Sister   . Hypertension Sister   . Diabetes Sister   . Thyroid disease Sister   . Arthritis Sister     History   Social History  . Marital Status: Married    Spouse Name: N/A    Number of Children: N/A  . Years of Education: N/A   Occupational History  . Not on file.   Social History Main Topics  . Smoking status: Former Games developer  . Smokeless tobacco: Never Used  . Alcohol Use: Yes     Comment: Rare  . Drug Use: No  . Sexually Active: No   Other Topics Concern  . Not on file   Social History Narrative  . No narrative on file    Current Outpatient Prescriptions on File Prior to Visit   Medication Sig Dispense Refill  . amLODipine (NORVASC) 5 MG tablet Take 1 tablet (5 mg total) by mouth daily.  30 tablet  6  . aspirin 81 MG tablet Take 81 mg by mouth daily.        . hydrochlorothiazide (HYDRODIURIL) 25 MG tablet TAKE 1 TABLET (25 MG TOTAL) BY MOUTH DAILY.  30 tablet  2  . hydrOXYzine (ATARAX/VISTARIL) 25 MG tablet TAKE 1 TABLET (25 MG TOTAL) BY MOUTH 2 (TWO) TIMES DAILY.  60 tablet  2  . insulin lispro protamine-insulin lispro (HUMALOG 75/25) (75-25) 100 UNIT/ML SUSP Take 15 units subcutaneously in the morning and 24 units in the evening.      . Insulin Pen Needle 32G X 4 MM MISC Use for insulin injection twice a day 250.00  100 each  2  . Krill Oil CAPS MegaRed caps daily      . losartan (COZAAR) 100 MG tablet TAKE 1 TABLET (100 MG TOTAL) BY MOUTH DAILY.  30 tablet  5  . metFORMIN (GLUCOPHAGE) 500 MG tablet Take 2 tablets (1,000 mg total) by mouth 2 (two) times daily with a meal.  120 tablet  0  . PARoxetine (  PAXIL CR) 37.5 MG 24 hr tablet Take 1 tablet (37.5 mg total) by mouth every morning.  90 tablet  2  . pyridOXINE (VITAMIN B-6) 100 MG tablet Take 100 mg by mouth daily.         No current facility-administered medications on file prior to visit.    Allergies  Allergen Reactions  . Penicillins     Review of Systems  Review of Systems  Constitutional: Negative for fever and malaise/fatigue.  HENT: Negative for congestion.   Eyes: Negative for discharge.  Respiratory: Negative for shortness of breath.   Cardiovascular: Negative for chest pain, palpitations and leg swelling.  Gastrointestinal: Negative for nausea, abdominal pain and diarrhea.  Genitourinary: Negative for dysuria.  Musculoskeletal: Negative for falls.  Skin: Negative for rash.  Neurological: Negative for loss of consciousness and headaches.  Endo/Heme/Allergies: Negative for polydipsia.  Psychiatric/Behavioral: Negative for depression and suicidal ideas. The patient is not nervous/anxious and  does not have insomnia.     Objective  BP 138/78  Pulse 100  Temp(Src) 98.5 F (36.9 C) (Oral)  Ht 5' 1.5" (1.562 m)  Wt 195 lb 1.3 oz (88.488 kg)  BMI 36.27 kg/m2  SpO2 100%  Physical Exam  Physical Exam  Constitutional: She is oriented to person, place, and time and well-developed, well-nourished, and in no distress. No distress.  HENT:  Head: Normocephalic and atraumatic.  Eyes: Conjunctivae are normal.  Neck: Neck supple. No thyromegaly present.  Cardiovascular: Normal rate, regular rhythm and normal heart sounds.   No murmur heard. Pulmonary/Chest: Effort normal and breath sounds normal. She has no wheezes.  Abdominal: She exhibits no distension and no mass.  Musculoskeletal: She exhibits no edema.  Lymphadenopathy:    She has no cervical adenopathy.  Neurological: She is alert and oriented to person, place, and time.  Skin: Skin is warm and dry. No rash noted. She is not diaphoretic.  Psychiatric: Memory, affect and judgment normal.    Lab Results  Component Value Date   TSH 1.593 12/15/2012   Lab Results  Component Value Date   WBC 8.0 12/15/2012   HGB 11.6* 12/15/2012   HCT 35.7* 12/15/2012   MCV 85.8 12/15/2012   PLT 637* 12/15/2012   Lab Results  Component Value Date   CREATININE 1.02 12/15/2012   BUN 11 12/15/2012   NA 139 12/15/2012   K 4.1 12/15/2012   CL 103 12/15/2012   CO2 28 12/15/2012   Lab Results  Component Value Date   ALT 20 12/15/2012   AST 20 12/15/2012   ALKPHOS 64 12/15/2012   BILITOT 0.7 12/15/2012   Lab Results  Component Value Date   CHOL 155 12/15/2012   Lab Results  Component Value Date   HDL 55 12/15/2012   Lab Results  Component Value Date   LDLCALC 74 12/15/2012   Lab Results  Component Value Date   TRIG 129 12/15/2012   Lab Results  Component Value Date   CHOLHDL 2.8 12/15/2012     Assessment & Plan  Diabetes mellitus His in 300s but low of 30, instructed on eating small, frequent meals with proteins so her sugars do  not drop so low. Minimize simple carbs and continue current meds for now.  Hypertension Well controlled no changes.  Hyperlipidemia Avoid trans fats, start krill oil caps, encouraged to start a statin, patient hesitant but will consider after trying diet and exercise for 3 more months  Anxiety Stable on Paxil CR 37.5 and 25 mg tab  combined each day

## 2012-12-27 NOTE — Assessment & Plan Note (Signed)
Stable on Paxil CR 37.5 and 25 mg tab combined each day

## 2012-12-27 NOTE — Assessment & Plan Note (Signed)
His in 300s but low of 30, instructed on eating small, frequent meals with proteins so her sugars do not drop so low. Minimize simple carbs and continue current meds for now.

## 2013-01-06 ENCOUNTER — Other Ambulatory Visit: Payer: Self-pay

## 2013-01-06 MED ORDER — METFORMIN HCL 500 MG PO TABS: 1000.0000 mg | ORAL_TABLET | Freq: Two times a day (BID) | ORAL | Status: DC

## 2013-01-06 MED ORDER — HYDROXYZINE HCL 25 MG PO TABS: 25.0000 mg | ORAL_TABLET | Freq: Two times a day (BID) | ORAL | Status: DC

## 2013-02-04 ENCOUNTER — Other Ambulatory Visit: Payer: Self-pay

## 2013-02-05 ENCOUNTER — Telehealth: Payer: Self-pay | Admitting: *Deleted

## 2013-02-05 MED ORDER — HYDROCHLOROTHIAZIDE 25 MG PO TABS: ORAL_TABLET | ORAL | Status: DC

## 2013-02-05 NOTE — Telephone Encounter (Signed)
Pt reports that she has changed pharmacy to [facility] on [address] and needs HCTZ  Refill to new pharmacy; Rx refill to pharmacy; pt informed & pharmacy changed in demographics/SLS

## 2013-02-09 ENCOUNTER — Ambulatory Visit: Payer: MEDICARE | Admitting: Family Medicine

## 2013-03-18 ENCOUNTER — Telehealth: Payer: Self-pay | Admitting: *Deleted

## 2013-03-18 MED ORDER — PAROXETINE HCL ER 37.5 MG PO TB24: 37.5000 mg | ORAL_TABLET | ORAL | Status: DC

## 2013-03-18 MED ORDER — PAROXETINE HCL ER 25 MG PO TB24: 25.0000 mg | ORAL_TABLET | ORAL | Status: DC

## 2013-03-18 NOTE — Telephone Encounter (Signed)
Faxed refill request received from pharmacy for Paxil 37.5 Last filled by MD on 08.29.13, #[id number] Last AEX - 05.29.14 Next AEX - 6-wks [pt has appt scheduled 08.29.14] Refill sent per [facility] refill protocol; pt informed must keep upcoming appt for further refills/SLS

## 2013-03-22 ENCOUNTER — Telehealth: Payer: Self-pay

## 2013-03-22 DIAGNOSIS — E785 Hyperlipidemia, unspecified: Secondary | ICD-10-CM | POA: Diagnosis not present

## 2013-03-22 DIAGNOSIS — I10 Essential (primary) hypertension: Secondary | ICD-10-CM

## 2013-03-22 DIAGNOSIS — E119 Type 2 diabetes mellitus without complications: Secondary | ICD-10-CM | POA: Diagnosis not present

## 2013-03-22 LAB — CBC
HCT: 35.4 % — ABNORMAL LOW (ref 36.0–46.0)
Hemoglobin: 11.6 g/dL — ABNORMAL LOW (ref 12.0–15.0)
MCH: 28.5 pg (ref 26.0–34.0)
MCHC: 32.8 g/dL (ref 30.0–36.0)
MCV: 87 fL (ref 78.0–100.0)

## 2013-03-22 LAB — HEPATIC FUNCTION PANEL
ALT: 29 U/L (ref 0–35)
AST: 25 U/L (ref 0–37)
Albumin: 4.5 g/dL (ref 3.5–5.2)
Alkaline Phosphatase: 78 U/L (ref 39–117)
Bilirubin, Direct: 0.1 mg/dL (ref 0.0–0.3)
Indirect Bilirubin: 0.6 mg/dL (ref 0.0–0.9)
Total Bilirubin: 0.7 mg/dL (ref 0.3–1.2)
Total Protein: 7.3 g/dL (ref 6.0–8.3)

## 2013-03-22 LAB — LIPID PANEL
Cholesterol: 156 mg/dL (ref 0–200)
HDL: 59 mg/dL
LDL Cholesterol: 70 mg/dL (ref 0–99)
Total CHOL/HDL Ratio: 2.6 ratio
Triglycerides: 137 mg/dL
VLDL: 27 mg/dL (ref 0–40)

## 2013-03-22 LAB — RENAL FUNCTION PANEL
Albumin: 4.5 g/dL (ref 3.5–5.2)
BUN: 9 mg/dL (ref 6–23)
CO2: 28 meq/L (ref 19–32)
Calcium: 9.9 mg/dL (ref 8.4–10.5)
Chloride: 99 meq/L (ref 96–112)
Creat: 0.95 mg/dL (ref 0.50–1.10)
Glucose, Bld: 79 mg/dL (ref 70–99)
Phosphorus: 3.2 mg/dL (ref 2.3–4.6)
Potassium: 3.8 meq/L (ref 3.5–5.3)
Sodium: 139 meq/L (ref 135–145)

## 2013-03-22 NOTE — Telephone Encounter (Signed)
Pt came in this morning for labwork. Please advise what labs w/diagnosis need to be ordered

## 2013-03-22 NOTE — Telephone Encounter (Signed)
Also can use HTN

## 2013-03-22 NOTE — Telephone Encounter (Signed)
Needs hgba1c, lipid, renal, cbc, tsh, hepatic for diabetes, hyperlipidemia

## 2013-03-22 NOTE — Telephone Encounter (Signed)
Lab order placed.

## 2013-03-23 LAB — HEMOGLOBIN A1C: Mean Plasma Glucose: 200 mg/dL — ABNORMAL HIGH (ref <117)

## 2013-03-26 ENCOUNTER — Ambulatory Visit (INDEPENDENT_AMBULATORY_CARE_PROVIDER_SITE_OTHER): Payer: MEDICARE | Admitting: Family Medicine

## 2013-03-26 ENCOUNTER — Encounter: Payer: Self-pay | Admitting: Family Medicine

## 2013-03-26 VITALS — BP 130/64 | HR 111 | Temp 98.5°F | Ht 61.5 in | Wt 191.8 lb

## 2013-03-26 DIAGNOSIS — F419 Anxiety disorder, unspecified: Secondary | ICD-10-CM

## 2013-03-26 DIAGNOSIS — F411 Generalized anxiety disorder: Secondary | ICD-10-CM

## 2013-03-26 DIAGNOSIS — J209 Acute bronchitis, unspecified: Secondary | ICD-10-CM

## 2013-03-26 DIAGNOSIS — G609 Hereditary and idiopathic neuropathy, unspecified: Secondary | ICD-10-CM

## 2013-03-26 DIAGNOSIS — E119 Type 2 diabetes mellitus without complications: Secondary | ICD-10-CM

## 2013-03-26 DIAGNOSIS — E785 Hyperlipidemia, unspecified: Secondary | ICD-10-CM

## 2013-03-26 MED ORDER — GABAPENTIN 300 MG PO CAPS: 300.0000 mg | ORAL_CAPSULE | Freq: Three times a day (TID) | ORAL | Status: DC

## 2013-03-26 MED ORDER — INSULIN LISPRO PROT & LISPRO (75-25 MIX) 100 UNIT/ML ~~LOC~~ SUSP: SUBCUTANEOUS | Status: DC

## 2013-03-26 MED ORDER — METFORMIN HCL 500 MG PO TABS: ORAL_TABLET | ORAL | Status: DC

## 2013-03-26 MED ORDER — HYDROCOD POLST-CHLORPHEN POLST 10-8 MG/5ML PO LQCR: 5.0000 mL | Freq: Every evening | ORAL | Status: DC | PRN

## 2013-03-26 MED ORDER — SULFAMETHOXAZOLE-TMP DS 800-160 MG PO TABS: 1.0000 | ORAL_TABLET | Freq: Two times a day (BID) | ORAL | Status: DC

## 2013-03-26 NOTE — Patient Instructions (Addendum)
  Probiotic daily such as Digestive Advantage or a generic  Acute Bronchitis You have acute bronchitis. This means you have a chest cold. The airways in your lungs are red and sore (inflamed). Acute means it is sudden onset.  CAUSES Bronchitis is most often caused by the same virus that causes a cold. SYMPTOMS   Body aches.  Chest congestion.  Chills.  Cough.  Fever.  Shortness of breath.  Sore throat. TREATMENT  Acute bronchitis is usually treated with rest, fluids, and medicines for relief of fever or cough. Most symptoms should go away after a few days or a week. Increased fluids may help thin your secretions and will prevent dehydration. Your caregiver may give you an inhaler to improve your symptoms. The inhaler reduces shortness of breath and helps control cough. You can take over-the-counter pain relievers or cough medicine to decrease coughing, pain, or fever. A cool-air vaporizer may help thin bronchial secretions and make it easier to clear your chest. Antibiotics are usually not needed but can be prescribed if you smoke, are seriously ill, have chronic lung problems, are elderly, or you are at higher risk for developing complications.Allergies and asthma can make bronchitis worse. Repeated episodes of bronchitis may cause longstanding lung problems. Avoid smoking and secondhand smoke.Exposure to cigarette smoke or irritating chemicals will make bronchitis worse. If you are a cigarette smoker, consider using nicotine gum or skin patches to help control withdrawal symptoms. Quitting smoking will help your lungs heal faster. Recovery from bronchitis is often slow, but you should start feeling better after 2 to 3 days. Cough from bronchitis frequently lasts for 3 to 4 weeks. To prevent another bout of acute bronchitis:  Quit smoking.  Wash your hands frequently to get rid of viruses or use a hand sanitizer.  Avoid other people with cold or virus symptoms.  Try not to touch  your hands to your mouth, nose, or eyes. SEEK IMMEDIATE MEDICAL CARE IF:  You develop increased fever, chills, or chest pain.  You have severe shortness of breath or bloody sputum.  You develop dehydration, fainting, repeated vomiting, or a severe headache.  You have no improvement after 1 week of treatment or you get worse. MAKE SURE YOU:   Understand these instructions.  Will watch your condition.  Will get help right away if you are not doing well or get worse. Document Released: 08/22/2004 Document Revised: 10/07/2011 Document Reviewed: 11/07/2010 [facility] Patient Information 2014 [place], [place].

## 2013-03-29 ENCOUNTER — Encounter: Payer: Self-pay | Admitting: Family Medicine

## 2013-03-29 DIAGNOSIS — J209 Acute bronchitis, unspecified: Secondary | ICD-10-CM

## 2013-03-29 HISTORY — DX: Acute bronchitis, unspecified: J20.9

## 2013-03-29 NOTE — Assessment & Plan Note (Signed)
Controlled on Paroxetine

## 2013-03-29 NOTE — Assessment & Plan Note (Signed)
Started on antibiotics and cough syrup

## 2013-03-29 NOTE — Assessment & Plan Note (Signed)
Numbers improving with recent change in meds. Has had some low numbers too so will only increase one of her Humalog mix by 2 units, minimize simple carbs.

## 2013-03-29 NOTE — Assessment & Plan Note (Signed)
Avoid trans fats, start krill oil and monitor

## 2013-03-29 NOTE — Progress Notes (Signed)
Patient ID: Beth Maldonado, female   DOB: Dec 19, 1945, 67 y.o.   MRN: [id number] Beth Maldonado [id number] 1946/06/14 03/29/2013      Progress Note-Follow Up  Subjective  Chief Complaint  Chief Complaint  Patient presents with  . Follow-up    3 month    HPI  Patient is a 67 year old female in today for followup. She said 2 weeks with head congestion and chest congestion. She has nasal congestion and postnasal drip productive green phlegm. Cough is keeping her up at night. She is malaise and fatigue as well. Denies headache, ear pain or sore throat. No chest pain, palpitations, GI symptoms. She's been under great deal of stress lately. Blood sugars have been labile ranging from 60s to 500s.  Past Medical History  Diagnosis Date  . Osteopenia   . Diabetes mellitus   . Hypertension   . Acute bronchitis 03/29/2013    Past Surgical History  Procedure Laterality Date  . Cataract extraction, bilateral    . Laparoscopy      rt knee  . Breast surgery      Reduction  . Vaginal hysterectomy      partial  . Tubal ligation    . Hemorrhoid surgery      Family History  Problem Relation Age of Onset  . Diabetes Mother   . Heart attack Father   . Heart disease Father   . Diabetes Sister   . Sickle cell anemia Sister   . Arthritis Sister   . Hypertension Sister   . Diabetes Sister   . Thyroid disease Sister   . Arthritis Sister     History   Social History  . Marital Status: Married    Spouse Name: N/A    Number of Children: N/A  . Years of Education: N/A   Occupational History  . Not on file.   Social History Main Topics  . Smoking status: Former Games developer  . Smokeless tobacco: Never Used  . Alcohol Use: Yes     Comment: Rare  . Drug Use: No  . Sexual Activity: No   Other Topics Concern  . Not on file   Social History Narrative  . No narrative on file    Current Outpatient Prescriptions on File Prior to Visit  Medication Sig Dispense Refill  .  aspirin 81 MG tablet Take 81 mg by mouth daily.        . hydrochlorothiazide (HYDRODIURIL) 25 MG tablet TAKE 1 TABLET (25 MG TOTAL) BY MOUTH DAILY.  30 tablet  5  . hydrOXYzine (ATARAX/VISTARIL) 25 MG tablet Take 1 tablet (25 mg total) by mouth 2 (two) times daily.  60 tablet  4  . Insulin Pen Needle 32G X 4 MM MISC Use for insulin injection twice a day 250.00  100 each  2  . Krill Oil CAPS MegaRed caps daily      . losartan (COZAAR) 100 MG tablet TAKE 1 TABLET (100 MG TOTAL) BY MOUTH DAILY.  30 tablet  5  . pyridOXINE (VITAMIN B-6) 100 MG tablet Take 100 mg by mouth daily.        Marland Kitchen amLODipine (NORVASC) 5 MG tablet Take 1 tablet (5 mg total) by mouth daily.  30 tablet  6   No current facility-administered medications on file prior to visit.    Allergies  Allergen Reactions  . Penicillins     Review of Systems  Review of Systems  Constitutional: Negative for fever and malaise/fatigue.  HENT: Positive for  congestion.   Eyes: Negative for discharge.  Respiratory: Positive for cough, sputum production and shortness of breath.   Cardiovascular: Negative for chest pain, palpitations and leg swelling.  Gastrointestinal: Negative for nausea, abdominal pain and diarrhea.  Genitourinary: Negative for dysuria.  Musculoskeletal: Negative for falls.  Skin: Negative for rash.  Neurological: Positive for headaches. Negative for loss of consciousness.  Endo/Heme/Allergies: Negative for polydipsia.  Psychiatric/Behavioral: Negative for depression and suicidal ideas. The patient is not nervous/anxious and does not have insomnia.     Objective  BP 130/64  Pulse 111  Temp(Src) 98.5 F (36.9 C) (Oral)  Ht 5' 1.5" (1.562 m)  Wt 191 lb 12 oz (86.977 kg)  BMI 35.65 kg/m2  SpO2 96%  Physical Exam  Physical Exam  Constitutional: She is oriented to person, place, and time and well-developed, well-nourished, and in no distress. No distress.  HENT:  Head: Normocephalic and atraumatic.  Eyes:  Conjunctivae are normal.  Neck: Neck supple. No thyromegaly present.  Cardiovascular: Normal rate, regular rhythm and normal heart sounds.   No murmur heard. Pulmonary/Chest: Effort normal and breath sounds normal. She has no wheezes.  Abdominal: She exhibits no distension and no mass.  Musculoskeletal: She exhibits no edema.  Lymphadenopathy:    She has no cervical adenopathy.  Neurological: She is alert and oriented to person, place, and time.  Skin: Skin is warm and dry. No rash noted. She is not diaphoretic.  Psychiatric: Memory, affect and judgment normal.    Lab Results  Component Value Date   TSH 2.170 03/22/2013   Lab Results  Component Value Date   WBC 11.7* 03/22/2013   HGB 11.6* 03/22/2013   HCT 35.4* 03/22/2013   MCV 87.0 03/22/2013   PLT 606* 03/22/2013   Lab Results  Component Value Date   CREATININE 0.95 03/22/2013   BUN 9 03/22/2013   NA 139 03/22/2013   K 3.8 03/22/2013   CL 99 03/22/2013   CO2 28 03/22/2013   Lab Results  Component Value Date   ALT 29 03/22/2013   AST 25 03/22/2013   ALKPHOS 78 03/22/2013   BILITOT 0.7 03/22/2013   Lab Results  Component Value Date   CHOL 156 03/22/2013   Lab Results  Component Value Date   HDL 59 03/22/2013   Lab Results  Component Value Date   LDLCALC 70 03/22/2013   Lab Results  Component Value Date   TRIG 137 03/22/2013   Lab Results  Component Value Date   CHOLHDL 2.6 03/22/2013     Assessment & Plan  Diabetes mellitus Numbers improving with recent change in meds. Has had some low numbers too so will only increase one of her Humalog mix by 2 units, minimize simple carbs.  Hyperlipidemia Avoid trans fats, start krill oil and monitor  Anxiety Controlled on Paroxetine  Acute bronchitis Started on antibiotics and cough syrup

## 2013-04-06 ENCOUNTER — Other Ambulatory Visit: Payer: Self-pay | Admitting: Family Medicine

## 2013-04-20 ENCOUNTER — Telehealth: Payer: Self-pay

## 2013-04-20 MED ORDER — PAROXETINE HCL ER 37.5 MG PO TB24: 37.5000 mg | ORAL_TABLET | ORAL | Status: DC

## 2013-04-20 NOTE — Telephone Encounter (Signed)
As long as she is taking the 37.5 mg xr Paroxetine daily she does not need to take the 25 mg tab

## 2013-04-20 NOTE — Telephone Encounter (Signed)
Patient left a message stating that she needs an RX of Paroxetine 37.5 sent to [facility]. Pt also stated that she stopped the Paroxetine 25 mg because it wasn't helping so she didn't feel the need to continue? Please advise if this is ok to stop.  I will refill the Paroxetine 37.5 mg

## 2013-04-21 NOTE — Telephone Encounter (Signed)
Patient informed and voiced understanding

## 2013-04-29 ENCOUNTER — Other Ambulatory Visit: Payer: Self-pay | Admitting: Family Medicine

## 2013-04-29 NOTE — Telephone Encounter (Signed)
Last RX on 03-26-13 quantity 90 with 5 refills

## 2013-04-30 ENCOUNTER — Telehealth: Payer: Self-pay

## 2013-04-30 DIAGNOSIS — G609 Hereditary and idiopathic neuropathy, unspecified: Secondary | ICD-10-CM

## 2013-04-30 MED ORDER — GABAPENTIN 300 MG PO CAPS: 300.0000 mg | ORAL_CAPSULE | Freq: Three times a day (TID) | ORAL | Status: DC

## 2013-04-30 NOTE — Telephone Encounter (Signed)
Pt left a message stating she would like her Gabapentin sent to [facility]. I looked and saw it was done on 03-26-13 but it says "no print".  I will respond

## 2013-06-03 ENCOUNTER — Other Ambulatory Visit: Payer: Self-pay | Admitting: Family Medicine

## 2013-06-03 ENCOUNTER — Other Ambulatory Visit: Payer: Self-pay

## 2013-06-05 ENCOUNTER — Other Ambulatory Visit: Payer: Self-pay | Admitting: Family Medicine

## 2013-06-07 NOTE — Telephone Encounter (Signed)
Rx request to pharmacy/SLS  

## 2013-06-17 ENCOUNTER — Telehealth: Payer: Self-pay

## 2013-06-17 DIAGNOSIS — E785 Hyperlipidemia, unspecified: Secondary | ICD-10-CM | POA: Diagnosis not present

## 2013-06-17 DIAGNOSIS — I10 Essential (primary) hypertension: Secondary | ICD-10-CM | POA: Diagnosis not present

## 2013-06-17 DIAGNOSIS — E119 Type 2 diabetes mellitus without complications: Secondary | ICD-10-CM | POA: Diagnosis not present

## 2013-06-17 NOTE — Telephone Encounter (Signed)
Lab orders entered

## 2013-06-17 NOTE — Telephone Encounter (Signed)
Needs hgba1c, lipid, renal, cbc, tsh and hepatic

## 2013-06-17 NOTE — Telephone Encounter (Signed)
Pt came in for lab work today. I don't see what needs to be ordered.  Please advise?

## 2013-06-18 LAB — LIPID PANEL
Cholesterol: 175 mg/dL (ref 0–200)
HDL: 45 mg/dL (ref >39)
LDL Cholesterol: 89 mg/dL (ref 0–99)
Triglycerides: 207 mg/dL — ABNORMAL HIGH (ref <150)
VLDL: 41 mg/dL — ABNORMAL HIGH (ref 0–40)

## 2013-06-18 LAB — RENAL FUNCTION PANEL
Albumin: 4 g/dL (ref 3.5–5.2)
BUN: 16 mg/dL (ref 6–23)
CO2: 25 mEq/L (ref 19–32)
Calcium: 9.3 mg/dL (ref 8.4–10.5)
Chloride: 100 mEq/L (ref 96–112)
Creat: 0.96 mg/dL (ref 0.50–1.10)
Glucose, Bld: 197 mg/dL — ABNORMAL HIGH (ref 70–99)
Phosphorus: 3.4 mg/dL (ref 2.3–4.6)
Potassium: 4.1 mEq/L (ref 3.5–5.3)
Sodium: 137 mEq/L (ref 135–145)

## 2013-06-18 LAB — CBC
Hemoglobin: 11.5 g/dL — ABNORMAL LOW (ref 12.0–15.0)
MCH: 28.5 pg (ref 26.0–34.0)
MCV: 87.1 fL (ref 78.0–100.0)
Platelets: 510 10*3/uL — ABNORMAL HIGH (ref 150–400)
RBC: 4.03 MIL/uL (ref 3.87–5.11)
RDW: 14.4 % (ref 11.5–15.5)
WBC: 7.2 10*3/uL (ref 4.0–10.5)

## 2013-06-18 LAB — HEMOGLOBIN A1C: Hgb A1c MFr Bld: 9.2 % — ABNORMAL HIGH (ref <5.7)

## 2013-06-18 LAB — HEPATIC FUNCTION PANEL
ALT: 15 U/L (ref 0–35)
AST: 13 U/L (ref 0–37)
Bilirubin, Direct: 0.1 mg/dL (ref 0.0–0.3)
Indirect Bilirubin: 0.4 mg/dL (ref 0.0–0.9)

## 2013-06-18 NOTE — Telephone Encounter (Signed)
Reprint of labs at [facility] request/SLS

## 2013-06-22 ENCOUNTER — Encounter: Payer: Self-pay | Admitting: Family Medicine

## 2013-06-22 ENCOUNTER — Telehealth: Payer: Self-pay | Admitting: Family Medicine

## 2013-06-22 ENCOUNTER — Ambulatory Visit (INDEPENDENT_AMBULATORY_CARE_PROVIDER_SITE_OTHER): Payer: MEDICARE | Admitting: Family Medicine

## 2013-06-22 VITALS — BP 138/90 | HR 111 | Temp 98.6°F | Ht 61.5 in | Wt 193.1 lb

## 2013-06-22 DIAGNOSIS — E119 Type 2 diabetes mellitus without complications: Secondary | ICD-10-CM | POA: Diagnosis not present

## 2013-06-22 DIAGNOSIS — I10 Essential (primary) hypertension: Secondary | ICD-10-CM

## 2013-06-22 DIAGNOSIS — N951 Menopausal and female climacteric states: Secondary | ICD-10-CM | POA: Diagnosis not present

## 2013-06-22 DIAGNOSIS — F329 Major depressive disorder, single episode, unspecified: Secondary | ICD-10-CM

## 2013-06-22 DIAGNOSIS — R232 Flushing: Secondary | ICD-10-CM

## 2013-06-22 DIAGNOSIS — E785 Hyperlipidemia, unspecified: Secondary | ICD-10-CM

## 2013-06-22 MED ORDER — VENLAFAXINE HCL ER 75 MG PO CP24: 75.0000 mg | ORAL_CAPSULE | Freq: Every day | ORAL | Status: DC

## 2013-06-22 MED ORDER — INSULIN LISPRO PROT & LISPRO (75-25 MIX) 100 UNIT/ML ~~LOC~~ SUSP: SUBCUTANEOUS | Status: DC

## 2013-06-22 NOTE — Patient Instructions (Signed)

## 2013-06-22 NOTE — Progress Notes (Signed)
Pre visit review using our clinic review tool, if applicable. No additional management support is needed unless otherwise documented below in the visit note. 

## 2013-06-22 NOTE — Progress Notes (Signed)
Patient ID: Beth Maldonado, female   DOB: 1945/12/22, 67 y.o.   MRN: [id number] GARRY BOCHICCHIO [id number] [birth year] 06/22/2013      Progress Note-Follow Up  Subjective  Chief Complaint  Chief Complaint  Patient presents with  . Follow-up    3 month    HPI  Patient is a 67 yo female in today for follow up, sugars are poorly. Has 50-300. Denies polyuria and polydipsia. No recent illness. No chest pain, palpitations or shortness of breath. No polyuria or polydipsia. Is taking meds as prescribed. No other acute concerns  Past Medical History  Diagnosis Date  . Osteopenia   . Diabetes mellitus   . Hypertension   . Acute bronchitis 03/29/2013    Past Surgical History  Procedure Laterality Date  . Cataract extraction, bilateral    . Laparoscopy      rt knee  . Breast surgery      Reduction  . Vaginal hysterectomy      partial  . Tubal ligation    . Hemorrhoid surgery      Family History  Problem Relation Age of Onset  . Diabetes Mother   . Heart attack Father   . Heart disease Father   . Diabetes Sister   . Sickle cell anemia Sister   . Arthritis Sister   . Hypertension Sister   . Diabetes Sister   . Thyroid disease Sister   . Arthritis Sister     History   Social History  . Marital Status: Married    Spouse Name: N/A    Number of Children: N/A  . Years of Education: N/A   Occupational History  . Not on file.   Social History Main Topics  . Smoking status: Former Games developer  . Smokeless tobacco: Never Used  . Alcohol Use: Yes     Comment: Rare  . Drug Use: No  . Sexual Activity: No   Other Topics Concern  . Not on file   Social History Narrative  . No narrative on file    Current Outpatient Prescriptions on File Prior to Visit  Medication Sig Dispense Refill  . amLODipine (NORVASC) 5 MG tablet Take 1 tablet (5 mg total) by mouth daily.  30 tablet  6  . aspirin 81 MG tablet Take 81 mg by mouth daily.        Marland Kitchen gabapentin (NEURONTIN) 300  MG capsule Take 1 capsule (300 mg total) by mouth 3 (three) times daily. 1 tab po in am and 2 tabs po q pm may add a noon dose as needed  90 capsule  5  . hydrochlorothiazide (HYDRODIURIL) 25 MG tablet TAKE 1 TABLET (25 MG TOTAL) BY MOUTH DAILY.  30 tablet  5  . hydrOXYzine (ATARAX/VISTARIL) 25 MG tablet TAKE 1 TABLET BY MOUTH TWICE A DAY  60 tablet  3  . insulin lispro protamine-lispro (HUMALOG 75/25) (75-25) 100 UNIT/ML SUSP injection Take 16 units subcutaneously in the morning and 24 units in the evening.  10 mL    . Insulin Pen Needle 32G X 4 MM MISC Use for insulin injection twice a day 250.00  100 each  2  . Krill Oil CAPS MegaRed caps daily      . losartan (COZAAR) 100 MG tablet TAKE 1 TABLET BY MOUTH ONCE A DAY  30 tablet  1  . metFORMIN (GLUCOPHAGE) 500 MG tablet TAKE 2 TABLETS BY MOUTH TWICE A DAY WITH MEALS  120 tablet  3  . PARoxetine (PAXIL-CR)  25 MG 24 hr tablet Take 25 mg by mouth every morning. Rx goes to [facility]      . PARoxetine (PAXIL-CR) 37.5 MG 24 hr tablet Take 1 tablet (37.5 mg total) by mouth every morning. Rx goes to [facility]  30 tablet  1  . pyridOXINE (VITAMIN B-6) 100 MG tablet Take 100 mg by mouth daily.        Marland Kitchen sulfamethoxazole-trimethoprim (BACTRIM DS) 800-160 MG per tablet Take 1 tablet by mouth 2 (two) times daily.  20 tablet  0   No current facility-administered medications on file prior to visit.    Allergies  Allergen Reactions  . Penicillins     Review of Systems  Review of Systems  Constitutional: Negative for fever and malaise/fatigue.  HENT: Negative for congestion.   Eyes: Negative for discharge.  Respiratory: Negative for shortness of breath.   Cardiovascular: Negative for chest pain, palpitations and leg swelling.  Gastrointestinal: Negative for nausea, abdominal pain and diarrhea.  Genitourinary: Negative for dysuria.  Musculoskeletal: Negative for falls.  Skin: Negative for rash.  Neurological: Negative for loss of consciousness and  headaches.  Endo/Heme/Allergies: Negative for polydipsia.  Psychiatric/Behavioral: Negative for depression and suicidal ideas. The patient is not nervous/anxious and does not have insomnia.     Objective  BP 138/90  Pulse 111  Temp(Src) 98.6 F (37 C) (Oral)  Ht 5' 1.5" (1.562 m)  Wt 193 lb 1.9 oz (87.599 kg)  BMI 35.90 kg/m2  SpO2 97%  Physical Exam  Physical Exam  Constitutional: She is oriented to person, place, and time and well-developed, well-nourished, and in no distress. No distress.  HENT:  Head: Normocephalic and atraumatic.  Eyes: Conjunctivae are normal.  Neck: Neck supple. No thyromegaly present.  Cardiovascular: Normal rate, regular rhythm and normal heart sounds.   No murmur heard. Pulmonary/Chest: Effort normal and breath sounds normal. She has no wheezes.  Abdominal: She exhibits no distension and no mass.  Musculoskeletal: She exhibits no edema.  Lymphadenopathy:    She has no cervical adenopathy.  Neurological: She is alert and oriented to person, place, and time.  Skin: Skin is warm and dry. No rash noted. She is not diaphoretic.  Psychiatric: Memory, affect and judgment normal.    Lab Results  Component Value Date   TSH 2.659 06/17/2013   Lab Results  Component Value Date   WBC 7.2 06/17/2013   HGB 11.5* 06/17/2013   HCT 35.1* 06/17/2013   MCV 87.1 06/17/2013   PLT 510* 06/17/2013   Lab Results  Component Value Date   CREATININE 0.96 06/17/2013   BUN 16 06/17/2013   NA 137 06/17/2013   K 4.1 06/17/2013   CL 100 06/17/2013   CO2 25 06/17/2013   Lab Results  Component Value Date   ALT 15 06/17/2013   AST 13 06/17/2013   ALKPHOS 68 06/17/2013   BILITOT 0.5 06/17/2013   Lab Results  Component Value Date   CHOL 175 06/17/2013   Lab Results  Component Value Date   HDL 45 06/17/2013   Lab Results  Component Value Date   LDLCALC 89 06/17/2013   Lab Results  Component Value Date   TRIG 207* 06/17/2013   Lab Results   Component Value Date   CHOLHDL 3.9 06/17/2013     Assessment & Plan  Hypertension Well controlled, no changes, encouraged DASH diet.   Diabetes mellitus Encouraged avoid simple carbs has very labile sugars has had a low of 50 and a hi of  300, encouraged to eat small, frequent meals with lean proteins. No changes to insulin til she is able to control the low numbers. Consider endocrine referral, patient declines today

## 2013-06-22 NOTE — Telephone Encounter (Signed)
Lab order placed.

## 2013-06-22 NOTE — Telephone Encounter (Signed)
Lab order for week of 09-14-2013 Labs prior to next visit lipid, renal, cbc, tsh, hepatic, hgba1c

## 2013-06-27 ENCOUNTER — Encounter: Payer: Self-pay | Admitting: Family Medicine

## 2013-06-27 DIAGNOSIS — F418 Other specified anxiety disorders: Secondary | ICD-10-CM | POA: Insufficient documentation

## 2013-06-27 HISTORY — DX: Other specified anxiety disorders: F41.8

## 2013-06-27 NOTE — Assessment & Plan Note (Signed)
Encouraged daily Effexor.

## 2013-06-27 NOTE — Assessment & Plan Note (Signed)
Well controlled, no changes, encouraged DASH diet 

## 2013-06-27 NOTE — Assessment & Plan Note (Signed)
Encouraged avoid simple carbs has very labile sugars has had a low of 50 and a hi of 300, encouraged to eat small, frequent meals with lean proteins. No changes to insulin til she is able to control the low numbers. Consider endocrine referral, patient declines today

## 2013-06-28 ENCOUNTER — Other Ambulatory Visit: Payer: Self-pay

## 2013-06-28 DIAGNOSIS — E119 Type 2 diabetes mellitus without complications: Secondary | ICD-10-CM

## 2013-06-28 MED ORDER — INSULIN LISPRO PROT & LISPRO (75-25 MIX) 100 UNIT/ML ~~LOC~~ SUSP: SUBCUTANEOUS | Status: DC

## 2013-06-28 NOTE — Telephone Encounter (Signed)
Pt left a message stating that [facility] never received her insulin RX.  I spoke to pharmacist and she confirmed that they have not received this.  I will resend

## 2013-08-17 ENCOUNTER — Telehealth: Payer: Self-pay | Admitting: Family Medicine

## 2013-08-17 DIAGNOSIS — E119 Type 2 diabetes mellitus without complications: Secondary | ICD-10-CM

## 2013-08-17 MED ORDER — INSULIN LISPRO PROT & LISPRO (75-25 MIX) 100 UNIT/ML KWIKPEN: PEN_INJECTOR | SUBCUTANEOUS | Status: DC

## 2013-08-17 MED ORDER — INSULIN PEN NEEDLE 32G X 4 MM MISC: Status: DC

## 2013-08-17 NOTE — Telephone Encounter (Signed)
Rxs sent

## 2013-08-17 NOTE — Telephone Encounter (Signed)
Patient left message on nurse voicemail stating that Dr. Charlett Blake sent in a prescription for needles and vials on her insulan. She states that she has always been using the quick pen 75/25 humalog. She would like that sent in instead of the vials and needles. [place]

## 2013-08-23 ENCOUNTER — Telehealth: Payer: Self-pay | Admitting: Family Medicine

## 2013-08-23 DIAGNOSIS — E119 Type 2 diabetes mellitus without complications: Secondary | ICD-10-CM

## 2013-08-23 NOTE — Telephone Encounter (Signed)
losartin hydrochlorizide 25 mg Needles for her 75/25 humalog pen

## 2013-08-24 NOTE — Telephone Encounter (Signed)
Patient called back on this requesting these refills to be sent to [facility]  Also, she states that she does not want the vial, she wants the pen

## 2013-08-25 MED ORDER — LOSARTAN POTASSIUM 100 MG PO TABS: ORAL_TABLET | ORAL | Status: DC

## 2013-08-25 MED ORDER — INSULIN PEN NEEDLE 32G X 4 MM MISC: Status: DC

## 2013-08-25 MED ORDER — HYDROCHLOROTHIAZIDE 25 MG PO TABS: ORAL_TABLET | ORAL | Status: DC

## 2013-08-25 NOTE — Telephone Encounter (Signed)
Insulin Lispro Prot & Lispro (HUMALOG MIX 75/25 KWIKPEN) (75-25) 100 UNIT/ML Kwikpen 15 mL 3 08/17/2013     Sig: Take 16 units in the morning and 24 units in the evening.   Pharmacy: [facility] # [id number], [place]  E-Prescribing Status: Receipt confirmed by pharmacy (08/17/2013 5:05 PM EST)      Losartan & Hctz and PEN needles: Rx request to pharmacy/SLS

## 2013-09-06 ENCOUNTER — Telehealth: Payer: Self-pay | Admitting: Family Medicine

## 2013-09-06 ENCOUNTER — Encounter: Payer: Self-pay | Admitting: Internal Medicine

## 2013-09-06 ENCOUNTER — Ambulatory Visit (INDEPENDENT_AMBULATORY_CARE_PROVIDER_SITE_OTHER): Payer: MEDICARE | Admitting: Internal Medicine

## 2013-09-06 VITALS — BP 160/90 | HR 112 | Temp 97.8°F | Wt 189.2 lb

## 2013-09-06 DIAGNOSIS — H538 Other visual disturbances: Secondary | ICD-10-CM | POA: Diagnosis not present

## 2013-09-06 DIAGNOSIS — L299 Pruritus, unspecified: Secondary | ICD-10-CM | POA: Diagnosis not present

## 2013-09-06 DIAGNOSIS — E1165 Type 2 diabetes mellitus with hyperglycemia: Secondary | ICD-10-CM

## 2013-09-06 DIAGNOSIS — E1149 Type 2 diabetes mellitus with other diabetic neurological complication: Secondary | ICD-10-CM

## 2013-09-06 DIAGNOSIS — IMO0002 Reserved for concepts with insufficient information to code with codable children: Secondary | ICD-10-CM

## 2013-09-06 MED ORDER — MOMETASONE FUROATE 0.1 % EX OINT: TOPICAL_OINTMENT | Freq: Two times a day (BID) | CUTANEOUS | Status: DC

## 2013-09-06 NOTE — Patient Instructions (Addendum)
Goals for home glucose monitoring are : fasting  or morning glucose goal of  90-150. Two hours (from "first bite") after any meal , goal = < 180, preferably < 160.Any low blood glucoses should be reported immediately. STOP Neosporin.  Hydroxyzine  may cause drowsiness or affect balance

## 2013-09-06 NOTE — Progress Notes (Signed)
Pre visit review using our clinic review tool, if applicable. No additional management support is needed unless otherwise documented below in the visit note. 

## 2013-09-06 NOTE — Telephone Encounter (Signed)
Appointment scheduled for this afternoon with Dr. Linna Darner at [facility]

## 2013-09-06 NOTE — Telephone Encounter (Signed)
PLEASE CALL PATIENT AND OFFER APPOINTMENT AT [facility] [[facility] is full also]/SLS Thanks.

## 2013-09-06 NOTE — Progress Notes (Signed)
HPI- Began having several different symptoms beginning 3 weeks ago which patient attributes to taking effexor for menopausal symptoms since end of 05/2013. Had been on Paxil for this which wasn't helping which prompted the switch. Blurred vision began 3 weeks ago and can only read bigger font. Began having rash on right side of mid back that wraps around to stomach. Rash is pruritic and she states has "prickly pain" with it. Rash on stomach has improved. Has gotten worse on back. Has sued neosporin. Began having insomnia 4-5 nights ago and she says she is not sleeping at all. 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

## 2013-09-06 NOTE — Progress Notes (Signed)
   Subjective:    Patient ID: Beth Maldonado, female    DOB: February 07, 1946, 68 y.o.   MRN: [id number]  HPI  She began having several different symptoms beginning 3 weeks ago which patient attributes to taking Effexor for menopausal symptoms since end of 05/2013. She had been on Paxil for this w/o benefit which prompted the switch.  Blurred vision began 3 weeks ago and can only read bigger font. She began having rash on right side of mid back that wraps around to stomach. Rash is pruritic and she states has "prickly pain" with it. Rash on stomach has improved; but it has gotten worse on back. She has used neosporin. She began having insomnia 4-5 nights ago and she says she is not sleeping at all. 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     Review of Systems   She states that she run out of insulin was having trouble getting it refilled from the physician's office. During this time her blood sugars had been higher than 400 on occasion     Objective:   Physical Exam Gen.: well-nourished in appearance but weigh excess. Alert, appropriate and cooperative throughout exam.  Head: Normocephalic without obvious abnormalities Eyes: No corneal or conjunctival inflammation noted. Pupils equal round reactive to light and accommodation. Extraocular motion intact.  Ears: External  ear exam reveals no significant lesions or deformities. Canals clear .TMs normal. Hearing is grossly normal bilaterally. Nose: External nasal exam reveals no deformity or inflammation. Nasal mucosa are pink and moist. No lesions or exudates noted.  Mouth: Oral mucosa and oropharynx reveal no lesions or exudates. Teeth in good repair. Neck: No deformities, masses, or tenderness noted.  Lungs: Normal respiratory effort; chest expands symmetrically. Lungs are clear to auscultation without rales, wheezes, or increased work of breathing. Heart: Normal rate and rhythm. Normal S1 and S2. No gallop, click, or rub.No  murmur. Abdomen: Bowel sounds normal; abdomen soft and nontender. No masses, organomegaly or hernias noted.                                   Musculoskeletal/extremities: No deformity or scoliosis noted of  the thoracic or lumbar spine.   No clubbing, cyanosis, edema, or significant extremity  deformity noted. Tone & strength normal. Hand joints normal . Fingernail  health good. Vascular: Carotid, radial artery, dorsalis pedis and  posterior tibial pulses are full and equal. No bruits present. Neurologic: Alert and oriented x3. Deep tendon reflexes symmetrical and normal.        Skin: Intact without suspicious lesions or rashes.no obvious rash over the flank and abdomen on the right. Minimal hyperpigmentation and minimal  Papular character to touch. Lymph: No cervical, axillary lymphadenopathy present. Psych: Mood and affect are normal. Normally interactive                                                                                        Assessment & Plan:  #1 pruritic dermatitis, component Neosporin contact dermatitis suspect #2 uncontrolled DM #3 blurred vision due to #2

## 2013-09-06 NOTE — Telephone Encounter (Signed)
Patient Information:  Caller Name: [place]  Phone: [phone]  Patient: Beth Maldonado, Docter  Gender: Female  DOB: [birth year]  Age: 68 Years  PCP: Penni Homans [facility])  Office Follow Up:  Does the office need to follow up with this patient?: Yes  Instructions For The Office: Patient is requesting to be worked in Charity fundraiser Note:  Patient calling about Side effects of memory loss and difficulty sleeping since starting Effexor in November 2014.  Also c/o rash on back with blisters x 1 week.  Symptoms  Reason For Call & Symptoms: memory loss and insomnia since starting new medicine Venlafaxine HCL ER  3 months ago, painful rash on back x 1 week - painful  Reviewed Health History In EMR: Yes  Reviewed Medications In EMR: Yes  Reviewed Allergies In EMR: Yes  Reviewed Surgeries / Procedures: Yes  Date of Onset of Symptoms: 06/25/2013  Guideline(s) Used:  Rash or Redness - Localized  Disposition Per Guideline:   See Today in Office  Reason For Disposition Reached:   Painful rash and has multiple small blisters grouped together in one area of body (i.e., dermatomal distribution or "band" or "stripe")  Advice Given:  Call Back If:  You become worse.  Patient Will Follow Care Advice:  YES

## 2013-09-13 ENCOUNTER — Telehealth: Payer: Self-pay | Admitting: Family Medicine

## 2013-09-13 NOTE — Telephone Encounter (Signed)
Patient states that she stopped taking venaflaxine because she thought she was having side effects to it. She would like to know what she should take as an alternative to this med?

## 2013-09-13 NOTE — Telephone Encounter (Signed)
Please advise 

## 2013-09-14 NOTE — Telephone Encounter (Signed)
So I should knwo her symptoms so we can document them then she can try Escitalopram 10 mg daily unless she has tried that before. Disp #30 with 1 rf. Needs appt in roughly 4 weeks or sooner if she is struggling

## 2013-09-15 NOTE — Telephone Encounter (Signed)
Left a message on answering machine for patient to return my call

## 2013-09-16 NOTE — Telephone Encounter (Signed)
Called pt again still no answer LMOM RTC ASAP.../lmb 

## 2013-09-21 ENCOUNTER — Ambulatory Visit: Payer: MEDICARE | Admitting: Family Medicine

## 2013-09-24 ENCOUNTER — Other Ambulatory Visit: Payer: MEDICARE

## 2013-09-28 ENCOUNTER — Telehealth: Payer: Self-pay | Admitting: Family Medicine

## 2013-09-28 ENCOUNTER — Other Ambulatory Visit: Payer: MEDICARE

## 2013-09-28 ENCOUNTER — Other Ambulatory Visit: Payer: Self-pay | Admitting: *Deleted

## 2013-09-28 DIAGNOSIS — D649 Anemia, unspecified: Secondary | ICD-10-CM

## 2013-09-28 NOTE — Telephone Encounter (Signed)
What diagnosis are we using for this IFOB?

## 2013-09-28 NOTE — Addendum Note (Signed)
Addended by: Varney Daily on: 09/28/2013 03:23 PM   Modules accepted: Orders

## 2013-09-28 NOTE — Telephone Encounter (Signed)
[place] office called and needs an order placed for this patient for an IFOB

## 2013-09-29 NOTE — Telephone Encounter (Signed)
Order entered

## 2013-09-29 NOTE — Telephone Encounter (Signed)
[facility] for IFOB order

## 2013-09-29 NOTE — Addendum Note (Signed)
Addended by: Kelle Darting A on: 09/29/2013 02:39 PM   Modules accepted: Orders

## 2013-09-30 ENCOUNTER — Telehealth: Payer: Self-pay

## 2013-09-30 DIAGNOSIS — D649 Anemia, unspecified: Secondary | ICD-10-CM

## 2013-09-30 NOTE — Telephone Encounter (Signed)
Message copied by Varney Daily on Thu Sep 30, 2013  4:55 PM ------      Message from: Irven Baltimore      Created: Thu Sep 30, 2013  4:36 PM       [facility] lab called. They need an order for an IFOB that was done in late February. They cannot put results into her chart until the order has been placed. ------

## 2013-09-30 NOTE — Telephone Encounter (Signed)
The order was put in on 09-29-13.  I put order in again

## 2013-10-04 ENCOUNTER — Ambulatory Visit (INDEPENDENT_AMBULATORY_CARE_PROVIDER_SITE_OTHER): Payer: Medicare Other | Admitting: Internal Medicine

## 2013-10-04 VITALS — BP 158/74 | HR 97 | Temp 98.7°F | Resp 18 | Ht 62.0 in | Wt 190.6 lb

## 2013-10-04 DIAGNOSIS — E785 Hyperlipidemia, unspecified: Secondary | ICD-10-CM | POA: Diagnosis not present

## 2013-10-04 DIAGNOSIS — N390 Urinary tract infection, site not specified: Secondary | ICD-10-CM | POA: Diagnosis not present

## 2013-10-04 DIAGNOSIS — R319 Hematuria, unspecified: Secondary | ICD-10-CM

## 2013-10-04 DIAGNOSIS — I10 Essential (primary) hypertension: Secondary | ICD-10-CM | POA: Diagnosis not present

## 2013-10-04 DIAGNOSIS — E119 Type 2 diabetes mellitus without complications: Secondary | ICD-10-CM | POA: Diagnosis not present

## 2013-10-04 LAB — POCT UA - MICROSCOPIC ONLY

## 2013-10-04 LAB — CBC
HCT: 33.8 % — ABNORMAL LOW (ref 36.0–46.0)
Hemoglobin: 11.3 g/dL — ABNORMAL LOW (ref 12.0–15.0)
MCH: 28.7 pg (ref 26.0–34.0)
MCHC: 33.4 g/dL (ref 30.0–36.0)
MCV: 85.8 fL (ref 78.0–100.0)
Platelets: 627 10*3/uL — ABNORMAL HIGH (ref 150–400)
RBC: 3.94 MIL/uL (ref 3.87–5.11)
RDW: 14.6 % (ref 11.5–15.5)
WBC: 9.7 10*3/uL (ref 4.0–10.5)

## 2013-10-04 LAB — LIPID PANEL
Cholesterol: 147 mg/dL (ref 0–200)
HDL: 45 mg/dL (ref >39)
LDL Cholesterol: 75 mg/dL (ref 0–99)
Total CHOL/HDL Ratio: 3.3 Ratio
Triglycerides: 135 mg/dL (ref <150)
VLDL: 27 mg/dL (ref 0–40)

## 2013-10-04 LAB — HEMOGLOBIN A1C
Hgb A1c MFr Bld: 9.2 % — ABNORMAL HIGH (ref <5.7)
Mean Plasma Glucose: 217 mg/dL — ABNORMAL HIGH (ref <117)

## 2013-10-04 LAB — RENAL FUNCTION PANEL
Albumin: 4.1 g/dL (ref 3.5–5.2)
BUN: 12 mg/dL (ref 6–23)
CO2: 29 mEq/L (ref 19–32)
Calcium: 9.2 mg/dL (ref 8.4–10.5)
Chloride: 103 mEq/L (ref 96–112)
Creat: 0.88 mg/dL (ref 0.50–1.10)
Glucose, Bld: 178 mg/dL — ABNORMAL HIGH (ref 70–99)
Phosphorus: 3.3 mg/dL (ref 2.3–4.6)
Potassium: 3.7 mEq/L (ref 3.5–5.3)
Sodium: 142 mEq/L (ref 135–145)

## 2013-10-04 LAB — POCT URINALYSIS DIPSTICK
Glucose, UA: 100
Ketones, UA: 80
Nitrite, UA: POSITIVE
Spec Grav, UA: 1.01
Urobilinogen, UA: >=8
pH, UA: 8.5

## 2013-10-04 LAB — HEPATIC FUNCTION PANEL
ALT: 20 U/L (ref 0–35)
AST: 18 U/L (ref 0–37)
Albumin: 4.1 g/dL (ref 3.5–5.2)
Alkaline Phosphatase: 62 U/L (ref 39–117)
Bilirubin, Direct: 0.1 mg/dL (ref 0.0–0.3)
Indirect Bilirubin: 0.5 mg/dL (ref 0.2–1.2)
Total Bilirubin: 0.6 mg/dL (ref 0.2–1.2)
Total Protein: 6.7 g/dL (ref 6.0–8.3)

## 2013-10-04 LAB — TSH: TSH: 1.39 u[IU]/mL (ref 0.350–4.500)

## 2013-10-04 MED ORDER — SULFAMETHOXAZOLE-TRIMETHOPRIM 800-160 MG PO TABS: 1.0000 | ORAL_TABLET | Freq: Two times a day (BID) | ORAL | Status: DC

## 2013-10-04 NOTE — Progress Notes (Signed)
This chart was scribed for Tami Lin, MD by Einar Pheasant, ED Scribe. This patient was seen in room 9 and the patient's care was started at 9:21 PM. Subjective:    Patient ID: Beth Maldonado, female    DOB: March 12, 1946, 68 y.o.   MRN: [id number]  HPI HPI Comments: Beth Maldonado is a 68 y.o. female who presents to the [facility] complaining of hematuria that started today. Pt is also complaining of associated increased frequency. She states that she has never had any problems with her bladder till now. She denies any fever, chills, diaphoresis, abdominal pain, chest pain, emesis, or nausea.    Patient Active Problem List   Diagnosis Date Noted  . Depression 06/27/2013  . Acute bronchitis 03/29/2013  . URI (upper respiratory infection) 06/18/2012  . Osteopenia   . Falls 11/17/2011  . Anxiety 05/21/2011  . Diabetes mellitus 02/20/2011  . Hypertension 02/20/2011  . Hyperlipidemia 02/20/2011  . 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 Osteopenia   . Diabetes mellitus   . Hypertension   . Acute bronchitis 03/29/2013  . Depression 06/27/2013  . Allergy   . Arthritis    Past Surgical History  Procedure Laterality Date  . Cataract extraction, bilateral    . Laparoscopy      rt knee  . Breast surgery      Reduction  . Vaginal hysterectomy      partial  . Tubal ligation    . Hemorrhoid surgery     Allergies  Allergen Reactions  . Penicillins    Prior to Admission medications   Medication Sig Start Date End Date Taking? Authorizing Provider  gabapentin (NEURONTIN) 300 MG capsule Take 1 capsule (300 mg total) by mouth 3 (three) times daily. 1 tab po in am and 2 tabs po q pm may add a noon dose as needed 04/30/13  Yes Mosie Lukes, MD  hydrochlorothiazide (HYDRODIURIL) 25 MG tablet TAKE 1 TABLET (25 MG TOTAL) BY MOUTH DAILY. 08/25/13  Yes Mosie Lukes, MD  Insulin Lispro Prot & Lispro (HUMALOG MIX 75/25  KWIKPEN) (75-25) 100 UNIT/ML Kwikpen Take 16 units in the morning and 24 units in the evening. 08/17/13  Yes Mosie Lukes, MD  Insulin Pen Needle 32G X 4 MM MISC Use for insulin injection twice a day 250.00 08/25/13  Yes Mosie Lukes, MD  Astrid Drafts CAPS MegaRed caps daily 09/22/12  Yes Mosie Lukes, MD  losartan (COZAAR) 100 MG tablet TAKE 1 TABLET BY MOUTH ONCE A DAY 08/25/13  Yes Mosie Lukes, MD  metFORMIN (GLUCOPHAGE) 500 MG tablet TAKE 2 TABLETS BY MOUTH TWICE A DAY WITH MEALS 06/03/13  Yes Mosie Lukes, MD  mometasone (ELOCON) 0.1 % ointment Apply topically 2 (two) times daily. 09/06/13  Yes Hendricks Limes, MD  pyridOXINE (VITAMIN B-6) 100 MG tablet Take 100 mg by mouth daily.     Yes Historical Provider, MD  amLODipine (NORVASC) 5 MG tablet Take 1 tablet (5 mg total) by mouth daily. 03/26/12 06/22/13  Burnice Logan, MD  aspirin 81 MG tablet Take 81 mg by mouth daily.      Historical Provider, MD  hydrOXYzine (ATARAX/VISTARIL) 25 MG tablet TAKE 1 TABLET BY MOUTH TWICE A DAY 06/03/13   Mosie Lukes, MD  venlafaxine XR (EFFEXOR XR) 75 MG 24 hr capsule Take 1 capsule (75 mg total) by mouth daily with breakfast. 06/22/13  Mosie Lukes, MD   History   Social History  . Marital Status: Married    Spouse Name: N/A    Number of Children: N/A  . Years of Education: N/A   Occupational History  . Not on file.   Social History Main Topics  . Smoking status: Former Research scientist (life sciences)  . Smokeless tobacco: Never Used  . Alcohol Use: Yes     Comment: Rare  . Drug Use: No  . Sexual Activity: No   Other Topics Concern  . Not on file   Social History Narrative  . No narrative on file     Review of Systems As per HPI and PMH    Objective:   Physical Exam  Nursing note and vitals reviewed. Constitutional: She is oriented to person, place, and time. She appears well-developed and well-nourished.  HENT:  Head: Normocephalic and atraumatic.  Cardiovascular: Normal rate.     Pulmonary/Chest: Effort normal.  Abdominal: She exhibits no distension.  Neurological: She is alert and oriented to person, place, and time.  Skin: Skin is warm and dry.  Psychiatric: She has a normal mood and affect.    Filed Vitals:   10/04/13 2106  BP: 158/74  Pulse: 97  Temp: 98.7 F (37.1 C)  TempSrc: Oral  Resp: 18  Height: 5\' 2"  (1.575 m)  Weight: 190 lb 9.6 oz (86.456 kg)  SpO2: 97%      Results for orders placed in visit on 10/04/13  POCT URINALYSIS DIPSTICK      Result Value Ref Range   Color, UA red     Clarity, UA turbid     Glucose, UA 100     Bilirubin, UA large     Ketones, UA 80     Spec Grav, UA 1.010     Blood, UA large     pH, UA 8.5     Protein, UA 3+     Urobilinogen, UA >=8.0     Nitrite, UA pos     Leukocytes, UA large (3+)         Assessment & Plan:  Hematuria - Plan: POCT urinalysis dipstick, POCT UA - Microscopic Only, Urine culture  Urinary tract infection, site not specified - Plan: Urine culture  Meds ordered this encounter  Medications  . sulfamethoxazole-trimethoprim (BACTRIM DS,SEPTRA DS) 800-160 MG per tablet    Sig: Take 1 tablet by mouth 2 (two) times daily.    Dispense:  10 tablet    Refill:  0     I have completed the patient encounter in its entirety as documented by the scribe, with editing by me where necessary. Coran Dipaola P. Laney Pastor, M.D.

## 2013-10-05 ENCOUNTER — Telehealth: Payer: Self-pay | Admitting: *Deleted

## 2013-10-05 NOTE — Telephone Encounter (Signed)
See 10/04/13 urgent care office note:   [place] Triage Call Report Triage Record Num: [id number] Operator: Flonnie Hailstone Patient Name: Beth Maldonado Call Date & Time: 10/04/2013 7:20:52PM Patient Phone: [phone] PCP: Gwyneth Revels Patient Gender: Female PCP Fax : [phone] Patient DOB: 01-11-1946 Practice Name: [facility] Reason for Call: Caller: Ahjanae/Patient; PCP: Penni Homans (Family Practice); CB#: [phone]; Has blood in urine since 3-9. Has had frequent urination. Has passed clots in urine. Afebrile. Per Bloody Urine protocol, advised ED due to clots in urine. Protocol(s) Used: Bloody Urine Recommended Outcome per Protocol: See Provider within 4 hours Reason for Outcome: Passing blood clots with urination Care Advice:

## 2013-10-05 NOTE — Telephone Encounter (Signed)
fyi

## 2013-10-07 LAB — URINE CULTURE: Colony Count: >=100000

## 2013-10-08 ENCOUNTER — Encounter: Payer: Self-pay | Admitting: Family Medicine

## 2013-10-08 ENCOUNTER — Telehealth: Payer: Self-pay

## 2013-10-08 ENCOUNTER — Ambulatory Visit (INDEPENDENT_AMBULATORY_CARE_PROVIDER_SITE_OTHER): Payer: MEDICARE | Admitting: Family Medicine

## 2013-10-08 ENCOUNTER — Other Ambulatory Visit (INDEPENDENT_AMBULATORY_CARE_PROVIDER_SITE_OTHER): Payer: MEDICARE

## 2013-10-08 VITALS — BP 142/78 | HR 97 | Temp 98.2°F | Ht 62.0 in | Wt 190.1 lb

## 2013-10-08 DIAGNOSIS — F341 Dysthymic disorder: Secondary | ICD-10-CM

## 2013-10-08 DIAGNOSIS — F329 Major depressive disorder, single episode, unspecified: Secondary | ICD-10-CM

## 2013-10-08 DIAGNOSIS — F419 Anxiety disorder, unspecified: Principal | ICD-10-CM

## 2013-10-08 DIAGNOSIS — D649 Anemia, unspecified: Secondary | ICD-10-CM | POA: Diagnosis not present

## 2013-10-08 DIAGNOSIS — E119 Type 2 diabetes mellitus without complications: Secondary | ICD-10-CM | POA: Diagnosis not present

## 2013-10-08 DIAGNOSIS — D75839 Thrombocytosis, unspecified: Secondary | ICD-10-CM

## 2013-10-08 DIAGNOSIS — E785 Hyperlipidemia, unspecified: Secondary | ICD-10-CM

## 2013-10-08 DIAGNOSIS — G609 Hereditary and idiopathic neuropathy, unspecified: Secondary | ICD-10-CM | POA: Diagnosis not present

## 2013-10-08 DIAGNOSIS — D473 Essential (hemorrhagic) thrombocythemia: Secondary | ICD-10-CM | POA: Diagnosis not present

## 2013-10-08 DIAGNOSIS — F32A Depression, unspecified: Secondary | ICD-10-CM

## 2013-10-08 DIAGNOSIS — I10 Essential (primary) hypertension: Secondary | ICD-10-CM

## 2013-10-08 DIAGNOSIS — F3289 Other specified depressive episodes: Secondary | ICD-10-CM

## 2013-10-08 LAB — FECAL OCCULT BLOOD, IMMUNOCHEMICAL
Fecal Occult Bld: NEGATIVE
Fecal Occult Bld: NEGATIVE

## 2013-10-08 MED ORDER — INSULIN LISPRO PROT & LISPRO (75-25 MIX) 100 UNIT/ML KWIKPEN: PEN_INJECTOR | SUBCUTANEOUS | Status: DC

## 2013-10-08 MED ORDER — ESCITALOPRAM OXALATE 20 MG PO TABS: ORAL_TABLET | ORAL | Status: DC

## 2013-10-08 MED ORDER — GABAPENTIN 300 MG PO CAPS: 600.0000 mg | ORAL_CAPSULE | Freq: Three times a day (TID) | ORAL | Status: DC

## 2013-10-08 MED ORDER — ESCITALOPRAM OXALATE 20 MG PO TABS: 20.0000 mg | ORAL_TABLET | Freq: Every day | ORAL | Status: DC

## 2013-10-08 NOTE — Patient Instructions (Signed)
Probiotic daily such as Digestive Advantage, Align, Phillip's Colon Health   Urinary Tract Infection Urinary tract infections (UTIs) can develop anywhere along your urinary tract. Your urinary tract is your body's drainage system for removing wastes and extra water. Your urinary tract includes two kidneys, two ureters, a bladder, and a urethra. Your kidneys are a pair of bean-shaped organs. Each kidney is about the size of your fist. They are located below your ribs, one on each side of your spine. CAUSES Infections are caused by microbes, which are microscopic organisms, including fungi, viruses, and bacteria. These organisms are so small that they can only be seen through a microscope. Bacteria are the microbes that most commonly cause UTIs. SYMPTOMS  Symptoms of UTIs may vary by age and gender of the patient and by the location of the infection. Symptoms in young women typically include a frequent and intense urge to urinate and a painful, burning feeling in the bladder or urethra during urination. Older women and men are more likely to be tired, shaky, and weak and have muscle aches and abdominal pain. A fever may mean the infection is in your kidneys. Other symptoms of a kidney infection include pain in your back or sides below the ribs, nausea, and vomiting. DIAGNOSIS To diagnose a UTI, your caregiver will ask you about your symptoms. Your caregiver also will ask to provide a urine sample. The urine sample will be tested for bacteria and white blood cells. White blood cells are made by your body to help fight infection. TREATMENT  Typically, UTIs can be treated with medication. Because most UTIs are caused by a bacterial infection, they usually can be treated with the use of antibiotics. The choice of antibiotic and length of treatment depend on your symptoms and the type of bacteria causing your infection. HOME CARE INSTRUCTIONS  If you were prescribed antibiotics, take them exactly as your  caregiver instructs you. Finish the medication even if you feel better after you have only taken some of the medication.  Drink enough water and fluids to keep your urine clear or pale yellow.  Avoid caffeine, tea, and carbonated beverages. They tend to irritate your bladder.  Empty your bladder often. Avoid holding urine for long periods of time.  Empty your bladder before and after sexual intercourse.  After a bowel movement, women should cleanse from front to back. Use each tissue only once. SEEK MEDICAL CARE IF:   You have back pain.  You develop a fever.  Your symptoms do not begin to resolve within 3 days. SEEK IMMEDIATE MEDICAL CARE IF:   You have severe back pain or lower abdominal pain.  You develop chills.  You have nausea or vomiting.  You have continued burning or discomfort with urination. MAKE SURE YOU:   Understand these instructions.  Will watch your condition.  Will get help right away if you are not doing well or get worse. Document Released: 04/24/2005 Document Revised: 01/14/2012 Document Reviewed: 08/23/2011 [facility] Patient Information 2014 [place].

## 2013-10-08 NOTE — Progress Notes (Signed)
Pre visit review using our clinic review tool, if applicable. No additional management support is needed unless otherwise documented below in the visit note. 

## 2013-10-08 NOTE — Telephone Encounter (Signed)
Dr Charlett Blake accidentally sent Lexapro to [facility]. I faxed an RX stating to please not fill was supposed to go to local pharmacy.  Received confirmation fax

## 2013-10-10 ENCOUNTER — Encounter: Payer: Self-pay | Admitting: Family Medicine

## 2013-10-10 DIAGNOSIS — D75839 Thrombocytosis, unspecified: Secondary | ICD-10-CM | POA: Insufficient documentation

## 2013-10-10 DIAGNOSIS — D473 Essential (hemorrhagic) thrombocythemia: Secondary | ICD-10-CM | POA: Insufficient documentation

## 2013-10-10 DIAGNOSIS — G609 Hereditary and idiopathic neuropathy, unspecified: Secondary | ICD-10-CM | POA: Insufficient documentation

## 2013-10-10 NOTE — Assessment & Plan Note (Signed)
Encouraged heart healthy diet, increase exercise, avoid trans fats, consider a krill oil cap daily 

## 2013-10-10 NOTE — Assessment & Plan Note (Addendum)
hgba1c elevated. minimize simple carbs. Increase exercise as tolerated. Continue current meds. Was out of Humalog mix for a month. Will continue to monitor

## 2013-10-10 NOTE — Assessment & Plan Note (Signed)
Try increasing doses of Gabapentin as needed and as tolerated

## 2013-10-10 NOTE — Assessment & Plan Note (Signed)
Started on Lexapro.

## 2013-10-10 NOTE — Assessment & Plan Note (Signed)
Referred to hematology due to persistence

## 2013-10-10 NOTE — Assessment & Plan Note (Addendum)
Improved on recheck. Well controlled, no changes to meds. Encouraged heart healthy diet such as the DASH diet and exercise as tolerated.  

## 2013-10-10 NOTE — Progress Notes (Signed)
Patient ID: Beth Maldonado, female   DOB: 04/07/1946, 68 y.o.   MRN: [id number] Beth Maldonado [id number] [birth year] 10/10/2013      Progress Note-Follow Up  Subjective  Chief Complaint  Chief Complaint  Patient presents with  . Follow-up    3 month    HPI  Patient is a 68 year old female in today for routine medical care. Patient is in today c/o persistent low mood and anxiety. No suicidal ideation. Sleeping poorly, no more than 4 hours of sleep. Has been struggling with intermittent pruritic rash. Denies CP/palp/SOB/HA/congestion/fevers/GI or GU c/o. Taking meds as prescribed  Past Medical History  Diagnosis Date  . Osteopenia   . Diabetes mellitus   . Hypertension   . Acute bronchitis 03/29/2013  . Depression 06/27/2013  . Allergy   . Arthritis     Past Surgical History  Procedure Laterality Date  . Cataract extraction, bilateral    . Laparoscopy      rt knee  . Breast surgery      Reduction  . Vaginal hysterectomy      partial  . Tubal ligation    . Hemorrhoid surgery      Family History  Problem Relation Age of Onset  . Diabetes Mother   . Heart attack Father   . Heart disease Father   . Diabetes Sister   . Sickle cell anemia Sister   . Arthritis Sister   . Hypertension Sister   . Diabetes Sister   . Thyroid disease Sister   . Arthritis Sister     History   Social History  . Marital Status: Married    Spouse Name: N/A    Number of Children: N/A  . Years of Education: N/A   Occupational History  . Not on file.   Social History Main Topics  . Smoking status: Former Research scientist (life sciences)  . Smokeless tobacco: Never Used  . Alcohol Use: Yes     Comment: Rare  . Drug Use: No  . Sexual Activity: No   Other Topics Concern  . Not on file   Social History Narrative  . No narrative on file    Current Outpatient Prescriptions on File Prior to Visit  Medication Sig Dispense Refill  . aspirin 81 MG tablet Take 81 mg by mouth daily.        .  hydrochlorothiazide (HYDRODIURIL) 25 MG tablet TAKE 1 TABLET (25 MG TOTAL) BY MOUTH DAILY.  30 tablet  2  . hydrOXYzine (ATARAX/VISTARIL) 25 MG tablet TAKE 1 TABLET BY MOUTH TWICE A DAY  60 tablet  3  . Insulin Pen Needle 32G X 4 MM MISC Use for insulin injection twice a day 250.00  100 each  2  . Krill Oil CAPS MegaRed caps daily      . losartan (COZAAR) 100 MG tablet TAKE 1 TABLET BY MOUTH ONCE A DAY  30 tablet  2  . metFORMIN (GLUCOPHAGE) 500 MG tablet TAKE 2 TABLETS BY MOUTH TWICE A DAY WITH MEALS  120 tablet  3  . mometasone (ELOCON) 0.1 % ointment Apply topically 2 (two) times daily.  45 g  0  . pyridOXINE (VITAMIN B-6) 100 MG tablet Take 100 mg by mouth daily.        Marland Kitchen sulfamethoxazole-trimethoprim (BACTRIM DS,SEPTRA DS) 800-160 MG per tablet Take 1 tablet by mouth 2 (two) times daily.  10 tablet  0   No current facility-administered medications on file prior to visit.    Allergies  Allergen Reactions  . Penicillins     Review of Systems  Review of Systems  Constitutional: Negative for fever and malaise/fatigue.  HENT: Negative for congestion.   Eyes: Negative for discharge.  Respiratory: Negative for shortness of breath.   Cardiovascular: Negative for chest pain, palpitations and leg swelling.  Gastrointestinal: Negative for nausea, abdominal pain and diarrhea.  Genitourinary: Negative for dysuria.  Musculoskeletal: Negative for falls.  Skin: Positive for itching and rash.  Neurological: Negative for loss of consciousness and headaches.  Endo/Heme/Allergies: Negative for polydipsia.  Psychiatric/Behavioral: Positive for depression. Negative for suicidal ideas. The patient is nervous/anxious and has insomnia.     Objective  BP 142/78  Pulse 97  Temp(Src) 98.2 F (36.8 C) (Oral)  Ht 5\' 2"  (1.575 m)  Wt 190 lb 1.9 oz (86.238 kg)  BMI 34.76 kg/m2  SpO2 99%  Physical Exam  Physical Exam  Constitutional: She is oriented to person, place, and time and well-developed,  well-nourished, and in no distress. No distress.  HENT:  Head: Normocephalic and atraumatic.  Eyes: Conjunctivae are normal.  Neck: Neck supple. No thyromegaly present.  Cardiovascular: Normal rate, regular rhythm and normal heart sounds.   No murmur heard. Pulmonary/Chest: Effort normal and breath sounds normal. She has no wheezes.  Abdominal: She exhibits no distension and no mass.  Musculoskeletal: She exhibits no edema.  Lymphadenopathy:    She has no cervical adenopathy.  Neurological: She is alert and oriented to person, place, and time.  Skin: Skin is warm and dry. No rash noted. She is not diaphoretic.  Psychiatric: Memory, affect and judgment normal.    Lab Results  Component Value Date   TSH 1.390 10/04/2013   Lab Results  Component Value Date   WBC 9.7 10/04/2013   HGB 11.3* 10/04/2013   HCT 33.8* 10/04/2013   MCV 85.8 10/04/2013   PLT 627* 10/04/2013   Lab Results  Component Value Date   CREATININE 0.88 10/04/2013   BUN 12 10/04/2013   NA 142 10/04/2013   K 3.7 10/04/2013   CL 103 10/04/2013   CO2 29 10/04/2013   Lab Results  Component Value Date   ALT 20 10/04/2013   AST 18 10/04/2013   ALKPHOS 62 10/04/2013   BILITOT 0.6 10/04/2013   Lab Results  Component Value Date   CHOL 147 10/04/2013   Lab Results  Component Value Date   HDL 45 10/04/2013   Lab Results  Component Value Date   LDLCALC 75 10/04/2013   Lab Results  Component Value Date   TRIG 135 10/04/2013   Lab Results  Component Value Date   CHOLHDL 3.3 10/04/2013     Assessment & Plan  Diabetes mellitus hgba1c elevated. minimize simple carbs. Increase exercise as tolerated. Continue current meds. Was out of Humalog mix for a month. Will continue to monitor  Hypertension Improved on recheck. Well controlled, no changes to meds. Encouraged heart healthy diet such as the DASH diet and exercise as tolerated.   Hyperlipidemia Encouraged heart healthy diet, increase exercise, avoid trans fats, consider a krill oil cap  daily  Thrombocytosis Referred to hematology due to persistence  Depression Started on Lexapro.   Unspecified hereditary and idiopathic peripheral neuropathy Try increasing doses of Gabapentin as needed and as tolerated

## 2013-10-11 ENCOUNTER — Telehealth: Payer: Self-pay | Admitting: Family Medicine

## 2013-10-11 NOTE — Telephone Encounter (Signed)
Relevant patient education assigned to patient using Emmi. ° °

## 2013-10-13 ENCOUNTER — Other Ambulatory Visit: Payer: Self-pay | Admitting: Family Medicine

## 2013-10-19 ENCOUNTER — Other Ambulatory Visit: Payer: Self-pay | Admitting: Family Medicine

## 2013-10-26 ENCOUNTER — Other Ambulatory Visit: Payer: Self-pay | Admitting: Family Medicine

## 2013-10-28 ENCOUNTER — Other Ambulatory Visit: Payer: Self-pay | Admitting: Family Medicine

## 2013-11-18 ENCOUNTER — Ambulatory Visit (INDEPENDENT_AMBULATORY_CARE_PROVIDER_SITE_OTHER): Payer: MEDICARE | Admitting: Family Medicine

## 2013-11-18 ENCOUNTER — Encounter: Payer: Self-pay | Admitting: Family Medicine

## 2013-11-18 VITALS — BP 140/78 | HR 80 | Temp 98.6°F | Ht 62.0 in | Wt 190.0 lb

## 2013-11-18 DIAGNOSIS — E119 Type 2 diabetes mellitus without complications: Secondary | ICD-10-CM

## 2013-11-18 DIAGNOSIS — E785 Hyperlipidemia, unspecified: Secondary | ICD-10-CM | POA: Diagnosis not present

## 2013-11-18 DIAGNOSIS — R319 Hematuria, unspecified: Secondary | ICD-10-CM | POA: Diagnosis not present

## 2013-11-18 DIAGNOSIS — I10 Essential (primary) hypertension: Secondary | ICD-10-CM

## 2013-11-18 MED ORDER — INSULIN LISPRO PROT & LISPRO (75-25 MIX) 100 UNIT/ML KWIKPEN: PEN_INJECTOR | SUBCUTANEOUS | Status: DC

## 2013-11-18 NOTE — Patient Instructions (Signed)
Needs urinalysis with next labs  Basic Carbohydrate Counting Basic carbohydrate counting is a way to plan meals. It is done by counting the amount of carbohydrate in foods. Foods that have carbohydrates are starches (grains, beans, starchy vegetables) and sweets. Eating carbohydrates increases blood glucose (sugar) levels. People with diabetes use carbohydrate counting to help keep their blood glucose at a normal level.  COUNTING CARBOHYDRATES IN FOODS The first step in counting carbohydrates is to learn how many carbohydrate servings you should have in every meal. A dietitian can plan this for you. After learning the amount of carbohydrates to include in your meal plan, you can start to choose the carbohydrate-containing foods you want to eat.  There are 2 ways to identify the amount of carbohydrates in the foods you eat.  Read the Nutrition Facts panel on food labels. You need 2 pieces of information from the Nutrition Facts panel to count carbohydrates this way:  Serving size.  Total carbohydrate (in grams). Decide how many servings you will be eating. If it is 1 serving, you will be eating the amount of carbohydrate listed on the panel. If you will be eating 2 servings, you will be eating double the amount of carbohydrate listed on the panel.   Learn serving sizes. A serving size of most carbohydrate-containing foods is about 15 grams (g). Listed below are single serving sizes of common carbohydrate-containing foods:  1 slice bread.   cup unsweetened, dry cereal.   cup hot cereal.   cup rice.   cup mashed potatoes.   cup pasta.  1 cup fresh fruit.   cup canned fruit.  1 cup milk (whole, 2%, or skim).   cup starchy vegetables (peas, corn, or potatoes). Counting carbohydrates this way is similar to looking on the Nutrition Facts panel. Decide how many servings you will eat first. Multiply the number of servings you eat by 15 g. For example, if you have 2 cups of  strawberries, you had 2 servings. That means you had 30 g of carbohydrate (2 servings x 15 g = 30 g). CALCULATING CARBOHYDRATES IN A MEAL Sample dinner  3 oz chicken breast.   cup brown rice.   cup corn.  1 cup fat-free milk.  1 cup strawberries with sugar-free whipped topping. Carbohydrate calculation First, identify the foods that contain carbohydrate:  Rice.  Corn.  Milk.  Strawberries. Calculate the number of servings eaten:  2 servings rice.  1 serving corn.  1 serving milk.  1 serving strawberries. Multiply the number of servings by 15 g:  2 servings rice x 15 g = 30 g.  1 serving corn x 15 g = 15 g.  1 serving milk x 15 g = 15 g.  1 serving strawberries x 15 g = 15 g. Add the amounts to find the total carbohydrates eaten: 30 g + 15 g + 15 g + 15 g = 75 g carbohydrate eaten at dinner. Document Released: 07/15/2005 Document Revised: 10/07/2011 Document Reviewed: 05/31/2011 [facility] Patient Information 2014 [place], [place].

## 2013-11-18 NOTE — Progress Notes (Signed)
Pre visit review using our clinic review tool, if applicable. No additional management support is needed unless otherwise documented below in the visit note. 

## 2013-11-19 LAB — URINALYSIS
BILIRUBIN URINE: NEGATIVE
GLUCOSE, UA: NEGATIVE mg/dL
Hgb urine dipstick: NEGATIVE
KETONES UR: NEGATIVE mg/dL
Leukocytes, UA: NEGATIVE
Nitrite: NEGATIVE
Protein, ur: NEGATIVE mg/dL
Specific Gravity, Urine: 1.013 (ref 1.005–1.030)
Urobilinogen, UA: 0.2 mg/dL (ref 0.0–1.0)
pH: 5 (ref 5.0–8.0)

## 2013-11-20 ENCOUNTER — Other Ambulatory Visit: Payer: Self-pay | Admitting: Family Medicine

## 2013-11-20 LAB — URINE CULTURE
COLONY COUNT: NO GROWTH
Organism ID, Bacteria: NO GROWTH

## 2013-11-21 ENCOUNTER — Encounter: Payer: Self-pay | Admitting: Family Medicine

## 2013-11-21 DIAGNOSIS — R319 Hematuria, unspecified: Secondary | ICD-10-CM | POA: Insufficient documentation

## 2013-11-21 NOTE — Assessment & Plan Note (Signed)
Resolved, culture negative

## 2013-11-21 NOTE — Progress Notes (Signed)
Patient ID: Beth Maldonado, female   DOB: February 21, 1946, 68 y.o.   MRN: [id number] Beth Maldonado [id number] 1946-05-20 11/21/2013      Progress Note-Follow Up  Subjective  Chief Complaint  Chief Complaint  Patient presents with  . Follow-up    6 week    HPI  Patient is a 68 year old female in today for routine medical care. Doing better with increasing gabapentin. Less pain in her feet. Notes that her blood sugars have dropped as low as 60 but when she decreased her insulin to 22 units in improved. No recent illness. No acute complaints. Denies CP/palp/SOB/HA/congestion/fevers/GI or GU c/o. Taking meds as prescribed  Past Medical History  Diagnosis Date  . Osteopenia   . Diabetes mellitus   . Hypertension   . Acute bronchitis 03/29/2013  . Depression 06/27/2013  . Allergy   . Arthritis     Past Surgical History  Procedure Laterality Date  . Cataract extraction, bilateral    . Laparoscopy      rt knee  . Breast surgery      Reduction  . Vaginal hysterectomy      partial  . Tubal ligation    . Hemorrhoid surgery      Family History  Problem Relation Age of Onset  . Diabetes Mother   . Heart attack Father   . Heart disease Father   . Diabetes Sister   . Sickle cell anemia Sister   . Arthritis Sister   . Hypertension Sister   . Diabetes Sister   . Thyroid disease Sister   . Arthritis Sister     History   Social History  . Marital Status: Married    Spouse Name: N/A    Number of Children: N/A  . Years of Education: N/A   Occupational History  . Not on file.   Social History Main Topics  . Smoking status: Former Research scientist (life sciences)  . Smokeless tobacco: Never Used  . Alcohol Use: Yes     Comment: Rare  . Drug Use: No  . Sexual Activity: No   Other Topics Concern  . Not on file   Social History Narrative  . No narrative on file    Current Outpatient Prescriptions on File Prior to Visit  Medication Sig Dispense Refill  . aspirin 81 MG tablet Take  81 mg by mouth daily.        Marland Kitchen escitalopram (LEXAPRO) 20 MG tablet 1/2 tab po daily x 7 days then 1 tab po daily  30 tablet  2  . gabapentin (NEURONTIN) 300 MG capsule Take 2 capsules (600 mg total) by mouth 3 (three) times daily.  180 capsule  3  . hydrOXYzine (ATARAX/VISTARIL) 25 MG tablet TAKE 1 TABLET BY MOUTH TWICE A DAY  60 tablet  3  . Insulin Pen Needle 32G X 4 MM MISC Use for insulin injection twice a day 250.00  100 each  2  . Krill Oil CAPS MegaRed caps daily      . losartan (COZAAR) 100 MG tablet TAKE 1 TABLET BY MOUTH ONCE A DAY  30 tablet  2  . metFORMIN (GLUCOPHAGE) 500 MG tablet TAKE 2 TABLETS BY MOUTH TWICE A DAY WITH MEALS  120 tablet  3  . pyridOXINE (VITAMIN B-6) 100 MG tablet Take 100 mg by mouth daily.         No current facility-administered medications on file prior to visit.    Allergies  Allergen Reactions  .  Penicillins     Review of Systems  Review of Systems  Constitutional: Negative for fever and malaise/fatigue.  HENT: Negative for congestion.   Eyes: Negative for discharge.  Respiratory: Negative for shortness of breath.   Cardiovascular: Negative for chest pain, palpitations and leg swelling.  Gastrointestinal: Negative for nausea, abdominal pain and diarrhea.  Genitourinary: Negative for dysuria.  Musculoskeletal: Negative for falls.  Skin: Negative for rash.  Neurological: Negative for loss of consciousness and headaches.  Endo/Heme/Allergies: Negative for polydipsia.  Psychiatric/Behavioral: Negative for depression and suicidal ideas. The patient is not nervous/anxious and does not have insomnia.     Objective  BP 140/78  Pulse 80  Temp(Src) 98.6 F (37 C) (Oral)  Ht 5\' 2"  (1.575 m)  Wt 190 lb 0.6 oz (86.202 kg)  BMI 34.75 kg/m2  SpO2 96%  Physical Exam  Physical Exam  Constitutional: She is oriented to person, place, and time and well-developed, well-nourished, and in no distress. No distress.  HENT:  Head: Normocephalic and  atraumatic.  Eyes: Conjunctivae are normal.  Neck: Neck supple. No thyromegaly present.  Cardiovascular: Normal rate, regular rhythm and normal heart sounds.   No murmur heard. Pulmonary/Chest: Effort normal and breath sounds normal. She has no wheezes.  Abdominal: She exhibits no distension and no mass.  Musculoskeletal: She exhibits no edema.  Lymphadenopathy:    She has no cervical adenopathy.  Neurological: She is alert and oriented to person, place, and time.  Skin: Skin is warm and dry. No rash noted. She is not diaphoretic.  Psychiatric: Memory, affect and judgment normal.    Lab Results  Component Value Date   TSH 1.390 10/04/2013   Lab Results  Component Value Date   WBC 9.7 10/04/2013   HGB 11.3* 10/04/2013   HCT 33.8* 10/04/2013   MCV 85.8 10/04/2013   PLT 627* 10/04/2013   Lab Results  Component Value Date   CREATININE 0.88 10/04/2013   BUN 12 10/04/2013   NA 142 10/04/2013   K 3.7 10/04/2013   CL 103 10/04/2013   CO2 29 10/04/2013   Lab Results  Component Value Date   ALT 20 10/04/2013   AST 18 10/04/2013   ALKPHOS 62 10/04/2013   BILITOT 0.6 10/04/2013   Lab Results  Component Value Date   CHOL 147 10/04/2013   Lab Results  Component Value Date   HDL 45 10/04/2013   Lab Results  Component Value Date   LDLCALC 75 10/04/2013   Lab Results  Component Value Date   TRIG 135 10/04/2013   Lab Results  Component Value Date   CHOLHDL 3.3 10/04/2013     Assessment & Plan  Hypertension Well controlled, no changes to meds. Encouraged heart healthy diet such as the DASH diet and exercise as tolerated.   Diabetes mellitus hgba1c elevated, minimize simple carbs. Increase exercise as tolerated. Continue current meds but increase Lispro insulin as tolerated  Hyperlipidemia Encouraged heart healthy diet, increase exercise, avoid trans fats, consider a krill oil cap daily  Hematuria Resolved, culture negative

## 2013-11-21 NOTE — Assessment & Plan Note (Signed)
Encouraged heart healthy diet, increase exercise, avoid trans fats, consider a krill oil cap daily 

## 2013-11-21 NOTE — Assessment & Plan Note (Signed)
hgba1c elevated, minimize simple carbs. Increase exercise as tolerated. Continue current meds but increase Lispro insulin as tolerated

## 2013-11-21 NOTE — Assessment & Plan Note (Signed)
Well controlled, no changes to meds. Encouraged heart healthy diet such as the DASH diet and exercise as tolerated.  °

## 2013-11-23 ENCOUNTER — Other Ambulatory Visit: Payer: Self-pay | Admitting: Family Medicine

## 2013-11-26 ENCOUNTER — Telehealth: Payer: Self-pay

## 2013-11-26 NOTE — Telephone Encounter (Signed)
Relevant patient education assigned to patient using Emmi. ° °

## 2013-12-27 LAB — HM DIABETES EYE EXAM

## 2014-01-24 DIAGNOSIS — Z1231 Encounter for screening mammogram for malignant neoplasm of breast: Secondary | ICD-10-CM | POA: Diagnosis not present

## 2014-02-07 ENCOUNTER — Encounter: Payer: Self-pay | Admitting: Family Medicine

## 2014-02-08 ENCOUNTER — Other Ambulatory Visit: Payer: Self-pay | Admitting: Family Medicine

## 2014-02-08 ENCOUNTER — Telehealth: Payer: Self-pay | Admitting: Family Medicine

## 2014-02-08 DIAGNOSIS — G609 Hereditary and idiopathic neuropathy, unspecified: Secondary | ICD-10-CM

## 2014-02-08 MED ORDER — LOSARTAN POTASSIUM 100 MG PO TABS: ORAL_TABLET | ORAL | Status: DC

## 2014-02-08 MED ORDER — METFORMIN HCL 500 MG PO TABS: ORAL_TABLET | ORAL | Status: DC

## 2014-02-08 MED ORDER — HYDROCHLOROTHIAZIDE 25 MG PO TABS: ORAL_TABLET | ORAL | Status: DC

## 2014-02-08 MED ORDER — GABAPENTIN 300 MG PO CAPS: 600.0000 mg | ORAL_CAPSULE | Freq: Three times a day (TID) | ORAL | Status: DC

## 2014-02-08 NOTE — Telephone Encounter (Signed)
Refill- metformin  Refill-gabapentin  Refill- losartan  Refill-hydrochlorothiazide  [facility]

## 2014-02-08 NOTE — Telephone Encounter (Signed)
Gabapentin refill sent to [facility]. Left message on [facility] voicemail to deactivate any refills that may be remaining on gabapentin as pt will start getting through mail order.

## 2014-02-08 NOTE — Telephone Encounter (Signed)
Last OV was 11/18/13 Gabapentin last RF on 10/08/13 # 180 x 3 rfs.   Please advise.

## 2014-02-23 ENCOUNTER — Other Ambulatory Visit: Payer: Self-pay | Admitting: Family Medicine

## 2014-02-23 NOTE — Telephone Encounter (Signed)
Pt left message that she was going to use mail order for: HCTZ, metformin, losartan and gabapentin but there was a price discrepancy when she tried to fill medication. She now wants to go through [facility] for these Rxs.  [facility] sent refill requests for 30 day supply of each medication and refills sent. Notified pt.

## 2014-03-07 ENCOUNTER — Other Ambulatory Visit: Payer: Self-pay | Admitting: Family Medicine

## 2014-03-17 ENCOUNTER — Other Ambulatory Visit: Payer: Self-pay | Admitting: Family Medicine

## 2014-03-17 ENCOUNTER — Telehealth: Payer: Self-pay | Admitting: *Deleted

## 2014-03-17 DIAGNOSIS — E119 Type 2 diabetes mellitus without complications: Secondary | ICD-10-CM

## 2014-03-17 DIAGNOSIS — E785 Hyperlipidemia, unspecified: Secondary | ICD-10-CM | POA: Diagnosis not present

## 2014-03-17 DIAGNOSIS — Z79899 Other long term (current) drug therapy: Secondary | ICD-10-CM | POA: Diagnosis not present

## 2014-03-17 DIAGNOSIS — Z0000 Encounter for general adult medical examination without abnormal findings: Secondary | ICD-10-CM | POA: Diagnosis not present

## 2014-03-17 DIAGNOSIS — I10 Essential (primary) hypertension: Secondary | ICD-10-CM | POA: Diagnosis not present

## 2014-03-17 LAB — HEPATIC FUNCTION PANEL
ALBUMIN: 4 g/dL (ref 3.5–5.2)
ALT: 18 U/L (ref 0–35)
AST: 18 U/L (ref 0–37)
Alkaline Phosphatase: 55 U/L (ref 39–117)
BILIRUBIN TOTAL: 0.7 mg/dL (ref 0.2–1.2)
Bilirubin, Direct: 0.1 mg/dL (ref 0.0–0.3)
Indirect Bilirubin: 0.6 mg/dL (ref 0.2–1.2)
Total Protein: 7.2 g/dL (ref 6.0–8.3)

## 2014-03-17 LAB — URINALYSIS, ROUTINE W REFLEX MICROSCOPIC
BILIRUBIN URINE: NEGATIVE
GLUCOSE, UA: NEGATIVE mg/dL
HGB URINE DIPSTICK: NEGATIVE
KETONES UR: NEGATIVE mg/dL
Leukocytes, UA: NEGATIVE
Nitrite: NEGATIVE
Protein, ur: NEGATIVE mg/dL
Specific Gravity, Urine: 1.024 (ref 1.005–1.030)
UROBILINOGEN UA: 1 mg/dL (ref 0.0–1.0)
pH: 6 (ref 5.0–8.0)

## 2014-03-17 LAB — LIPID PANEL
CHOLESTEROL: 177 mg/dL (ref 0–200)
HDL: 53 mg/dL (ref >39)
LDL Cholesterol: 96 mg/dL (ref 0–99)
Total CHOL/HDL Ratio: 3.3 Ratio
Triglycerides: 140 mg/dL (ref <150)
VLDL: 28 mg/dL (ref 0–40)

## 2014-03-17 LAB — CBC WITH DIFFERENTIAL/PLATELET
BASOS ABS: 0.1 10*3/uL (ref 0.0–0.1)
Basophils Relative: 1 % (ref 0–1)
EOS PCT: 11 % — AB (ref 0–5)
Eosinophils Absolute: 1.1 10*3/uL — ABNORMAL HIGH (ref 0.0–0.7)
HCT: 33.2 % — ABNORMAL LOW (ref 36.0–46.0)
Hemoglobin: 10.5 g/dL — ABNORMAL LOW (ref 12.0–15.0)
LYMPHS PCT: 26 % (ref 12–46)
Lymphs Abs: 2.6 10*3/uL (ref 0.7–4.0)
MCH: 27.1 pg (ref 26.0–34.0)
MCHC: 31.6 g/dL (ref 30.0–36.0)
MCV: 85.6 fL (ref 78.0–100.0)
Monocytes Absolute: 0.7 10*3/uL (ref 0.1–1.0)
Monocytes Relative: 7 % (ref 3–12)
NEUTROS ABS: 5.4 10*3/uL (ref 1.7–7.7)
Neutrophils Relative %: 55 % (ref 43–77)
Platelets: 627 10*3/uL — ABNORMAL HIGH (ref 150–400)
RBC: 3.88 MIL/uL (ref 3.87–5.11)
RDW: 15.1 % (ref 11.5–15.5)
WBC: 9.9 10*3/uL (ref 4.0–10.5)

## 2014-03-17 LAB — BASIC METABOLIC PANEL
BUN: 22 mg/dL (ref 6–23)
CO2: 28 mEq/L (ref 19–32)
CREATININE: 1.01 mg/dL (ref 0.50–1.10)
Calcium: 9.5 mg/dL (ref 8.4–10.5)
Chloride: 104 mEq/L (ref 96–112)
Glucose, Bld: 124 mg/dL — ABNORMAL HIGH (ref 70–99)
POTASSIUM: 5.1 meq/L (ref 3.5–5.3)
Sodium: 140 mEq/L (ref 135–145)

## 2014-03-17 LAB — TSH: TSH: 2.029 u[IU]/mL (ref 0.350–4.500)

## 2014-03-17 NOTE — Telephone Encounter (Signed)
Pt presented to lab w/o orders in EMR; pt has upcoming CPE scheduled, appropriate Orders placed and forwarded to [facility]

## 2014-03-21 ENCOUNTER — Telehealth: Payer: Self-pay

## 2014-03-21 ENCOUNTER — Encounter: Payer: Self-pay | Admitting: Family Medicine

## 2014-03-21 ENCOUNTER — Ambulatory Visit (INDEPENDENT_AMBULATORY_CARE_PROVIDER_SITE_OTHER): Payer: MEDICARE | Admitting: Family Medicine

## 2014-03-21 ENCOUNTER — Telehealth: Payer: Self-pay | Admitting: Family Medicine

## 2014-03-21 VITALS — BP 118/70 | HR 79 | Temp 98.2°F | Ht 62.0 in | Wt 193.1 lb

## 2014-03-21 DIAGNOSIS — D75839 Thrombocytosis, unspecified: Secondary | ICD-10-CM

## 2014-03-21 DIAGNOSIS — Z0000 Encounter for general adult medical examination without abnormal findings: Secondary | ICD-10-CM | POA: Diagnosis not present

## 2014-03-21 DIAGNOSIS — F411 Generalized anxiety disorder: Secondary | ICD-10-CM | POA: Diagnosis not present

## 2014-03-21 DIAGNOSIS — I10 Essential (primary) hypertension: Secondary | ICD-10-CM | POA: Diagnosis not present

## 2014-03-21 DIAGNOSIS — Z23 Encounter for immunization: Secondary | ICD-10-CM

## 2014-03-21 DIAGNOSIS — F341 Dysthymic disorder: Secondary | ICD-10-CM

## 2014-03-21 DIAGNOSIS — J209 Acute bronchitis, unspecified: Secondary | ICD-10-CM | POA: Diagnosis not present

## 2014-03-21 DIAGNOSIS — E118 Type 2 diabetes mellitus with unspecified complications: Secondary | ICD-10-CM | POA: Diagnosis not present

## 2014-03-21 DIAGNOSIS — D473 Essential (hemorrhagic) thrombocythemia: Secondary | ICD-10-CM | POA: Diagnosis not present

## 2014-03-21 DIAGNOSIS — M25512 Pain in left shoulder: Secondary | ICD-10-CM

## 2014-03-21 DIAGNOSIS — F418 Other specified anxiety disorders: Secondary | ICD-10-CM

## 2014-03-21 DIAGNOSIS — F419 Anxiety disorder, unspecified: Secondary | ICD-10-CM

## 2014-03-21 DIAGNOSIS — M25519 Pain in unspecified shoulder: Secondary | ICD-10-CM

## 2014-03-21 DIAGNOSIS — G609 Hereditary and idiopathic neuropathy, unspecified: Secondary | ICD-10-CM

## 2014-03-21 LAB — HEMOGLOBIN A1C
Hgb A1c MFr Bld: 7.8 % — ABNORMAL HIGH (ref <5.7)
Mean Plasma Glucose: 177 mg/dL — ABNORMAL HIGH (ref <117)

## 2014-03-21 MED ORDER — CYCLOBENZAPRINE HCL 5 MG PO TABS: 5.0000 mg | ORAL_TABLET | Freq: Every evening | ORAL | Status: DC | PRN

## 2014-03-21 MED ORDER — ALBUTEROL SULFATE HFA 108 (90 BASE) MCG/ACT IN AERS: 2.0000 | INHALATION_SPRAY | Freq: Four times a day (QID) | RESPIRATORY_TRACT | Status: DC | PRN

## 2014-03-21 MED ORDER — METHYLPREDNISOLONE (PAK) 4 MG PO TABS: ORAL_TABLET | ORAL | Status: DC

## 2014-03-21 MED ORDER — LEVOFLOXACIN 500 MG PO TABS: 500.0000 mg | ORAL_TABLET | Freq: Every day | ORAL | Status: DC

## 2014-03-21 MED ORDER — PNEUMOCOCCAL 13-VAL CONJ VACC IM SUSP: 0.5000 mL | Freq: Once | INTRAMUSCULAR | Status: DC

## 2014-03-21 NOTE — Assessment & Plan Note (Addendum)
Reports glucose range of 64 to 230s, minimize simple carbs and continue current meds for now, A1C is 7.8

## 2014-03-21 NOTE — Telephone Encounter (Signed)
Relevant patient education assigned to patient using Emmi. ° °

## 2014-03-21 NOTE — Patient Instructions (Addendum)
Needs hgba1c and UA with c&S prior to next visit  Acute Bronchitis Bronchitis is inflammation of the airways that extend from the windpipe into the lungs (bronchi). The inflammation often causes mucus to develop. This leads to a cough, which is the most common symptom of bronchitis.  In acute bronchitis, the condition usually develops suddenly and goes away over time, usually in a couple weeks. Smoking, allergies, and asthma can make bronchitis worse. Repeated episodes of bronchitis may cause further lung problems.  CAUSES Acute bronchitis is most often caused by the same virus that causes a cold. The virus can spread from person to person (contagious) through coughing, sneezing, and touching contaminated objects. SIGNS AND SYMPTOMS   Cough.   Fever.   Coughing up mucus.   Body aches.   Chest congestion.   Chills.   Shortness of breath.   Sore throat.  DIAGNOSIS  Acute bronchitis is usually diagnosed through a physical exam. Your health care provider will also ask you questions about your medical history. Tests, such as chest X-rays, are sometimes done to rule out other conditions.  TREATMENT  Acute bronchitis usually goes away in a couple weeks. Oftentimes, no medical treatment is necessary. Medicines are sometimes given for relief of fever or cough. Antibiotic medicines are usually not needed but may be prescribed in certain situations. In some cases, an inhaler may be recommended to help reduce shortness of breath and control the cough. A cool mist vaporizer may also be used to help thin bronchial secretions and make it easier to clear the chest.  HOME CARE INSTRUCTIONS  Get plenty of rest.   Drink enough fluids to keep your urine clear or pale yellow (unless you have a medical condition that requires fluid restriction). Increasing fluids may help thin your respiratory secretions (sputum) and reduce chest congestion, and it will prevent dehydration.   Take medicines  only as directed by your health care provider.  If you were prescribed an antibiotic medicine, finish it all even if you start to feel better.  Avoid smoking and secondhand smoke. Exposure to cigarette smoke or irritating chemicals will make bronchitis worse. If you are a smoker, consider using nicotine gum or skin patches to help control withdrawal symptoms. Quitting smoking will help your lungs heal faster.   Reduce the chances of another bout of acute bronchitis by washing your hands frequently, avoiding people with cold symptoms, and trying not to touch your hands to your mouth, nose, or eyes.   Keep all follow-up visits as directed by your health care provider.  SEEK MEDICAL CARE IF: Your symptoms do not improve after 1 week of treatment.  SEEK IMMEDIATE MEDICAL CARE IF:  You develop an increased fever or chills.   You have chest pain.   You have severe shortness of breath.  You have bloody sputum.   You develop dehydration.  You faint or repeatedly feel like you are going to pass out.  You develop repeated vomiting.  You develop a severe headache. MAKE SURE YOU:   Understand these instructions.  Will watch your condition.  Will get help right away if you are not doing well or get worse. Document Released: 08/22/2004 Document Revised: 11/29/2013 Document Reviewed: 01/05/2013 [facility] Patient Information 2015 [place], [place]. This information is not intended to replace advice given to you by your health care provider. Make sure you discuss any questions you have with your health care provider.

## 2014-03-21 NOTE — Assessment & Plan Note (Signed)
Well controlled, no changes to meds. Encouraged heart healthy diet such as the DASH diet and exercise as tolerated.  °

## 2014-03-21 NOTE — Telephone Encounter (Signed)
Gave this information to [employer]

## 2014-03-21 NOTE — Progress Notes (Signed)
Patient ID: [name], female   DOB: 03/14/1946, 68 y.o.   MRN: [id number] MIXTLI RENO [id number] Jan 30, 1946 03/21/2014      Progress Note-Follow Up  Subjective  Chief Complaint  Chief Complaint  Patient presents with  . Annual Exam    physical  . Injections    prevnar and flu    HPI  Patient is a 68 year old female in today for routine medical care. Patient is in today for annual exam with numerous concerns. Over the last week or so she's had increased shortness or breath, congestion and malaise. She describes a cough productive green phlegm. She's describing some left upper anterior chest wall discomfort but also some pain in her left shoulder which is worse with movement and when she lies on it. 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 She also describes a worsening concern regarding shortness of breath that has been evolving over several years. She does live with a smoker. She is a previous smoker but quit 30 years ago. She denies fevers and chills although she does note occasional hot flashes. She denies any palpitations but does note increased stressors with some difficulties with her adult children. Denies palp/HA/fevers/GI or GU c/o. Taking meds as prescribed  Past Medical History  Diagnosis Date  . Osteopenia   . Diabetes mellitus   . Hypertension   . Acute bronchitis 03/29/2013  . Depression 06/27/2013  . Allergy   . Arthritis     Past Surgical History  Procedure Laterality Date  . Cataract extraction, bilateral    . Laparoscopy      rt knee  . Breast surgery      Reduction  . Vaginal hysterectomy      partial  . Tubal ligation    . Hemorrhoid surgery      Family History  Problem Relation Age of Onset  . Diabetes Mother   . Heart attack Father   . Heart disease Father   . Diabetes Sister   . Sickle cell anemia Sister   . Arthritis Sister   . Hypertension Sister   . Diabetes Sister   . Thyroid disease Sister   .  Arthritis Sister     History   Social History  . Marital Status: Married    Spouse Name: N/A    Number of Children: N/A  . Years of Education: N/A   Occupational History  . Not on file.   Social History Main Topics  . Smoking status: Former Research scientist (life sciences)  . Smokeless tobacco: Never Used  . Alcohol Use: Yes     Comment: Rare  . Drug Use: No  . Sexual Activity: No   Other Topics Concern  . Not on file   Social History Narrative  . No narrative on file    Current Outpatient Prescriptions on File Prior to Visit  Medication Sig Dispense Refill  . aspirin 81 MG tablet Take 81 mg by mouth daily.        Marland Kitchen escitalopram (LEXAPRO) 20 MG tablet Take 1 tablet by mouth  daily  30 tablet  2  . gabapentin (NEURONTIN) 300 MG capsule TAKE 2 CAPSULES BY MOUTH 3 TIMES A DAY  180 capsule  3  . hydrochlorothiazide (HYDRODIURIL) 25 MG tablet TAKE 1 TABLET BY MOUTH DAILY.  30 tablet  3  . hydrOXYzine (ATARAX/VISTARIL) 25 MG tablet TAKE 1 TABLET BY MOUTH TWICE A DAY  60 tablet  3  . Insulin Lispro Prot &  Lispro (HUMALOG 75/25 MIX) (75-25) 100 UNIT/ML Kwikpen Take 14 units in the morning and 22 units in the evening.  15 mL    . Insulin Pen Needle 32G X 4 MM MISC Use for insulin injection twice a day 250.00  100 each  2  . Krill Oil CAPS MegaRed caps daily      . losartan (COZAAR) 100 MG tablet TAKE 1 TABLET BY MOUTH ONCE A DAY  30 tablet  3  . metFORMIN (GLUCOPHAGE) 500 MG tablet TAKE 2 TABLETS BY MOUTH TWICE A DAY WITH MEALS  120 tablet  3  . Multiple Vitamin (MULTIVITAMIN) tablet Take 1 tablet by mouth daily.      Marland Kitchen pyridOXINE (VITAMIN B-6) 100 MG tablet Take 100 mg by mouth daily.         No current facility-administered medications on file prior to visit.    Allergies  Allergen Reactions  . Penicillins     Review of Systems  Review of Systems  Constitutional: Negative for fever, chills and malaise/fatigue.  HENT: Negative for congestion, hearing loss and nosebleeds.   Eyes: Negative for  discharge.  Respiratory: Positive for cough and sputum production. Negative for shortness of breath and wheezing.   Cardiovascular: Positive for chest pain. Negative for palpitations and leg swelling.  Gastrointestinal: Negative for heartburn, nausea, vomiting, abdominal pain, diarrhea, constipation and blood in stool.  Genitourinary: Negative for dysuria, urgency, frequency and hematuria.  Musculoskeletal: Negative for back pain, falls and myalgias.  Skin: Negative for rash.  Neurological: Negative for dizziness, tremors, sensory change, focal weakness, loss of consciousness, weakness and headaches.  Endo/Heme/Allergies: Negative for polydipsia. Does not bruise/bleed easily.  Psychiatric/Behavioral: Negative for depression and suicidal ideas. The patient is nervous/anxious. The patient does not have insomnia.     Objective  BP 118/70  Pulse 79  Temp(Src) 98.2 F (36.8 C) (Oral)  Ht 5\' 2"  (1.575 m)  Wt 193 lb 1.3 oz (87.581 kg)  BMI 35.31 kg/m2  SpO2 96%  Physical Exam  Physical Exam  Constitutional: She is oriented to person, place, and time and well-developed, well-nourished, and in no distress. No distress.  HENT:  Head: Normocephalic and atraumatic.  Right Ear: External ear normal.  Left Ear: External ear normal.  Nose: Nose normal.  Mouth/Throat: Oropharynx is clear and moist. No oropharyngeal exudate.  Eyes: Conjunctivae are normal. Pupils are equal, round, and reactive to light. Right eye exhibits no discharge. Left eye exhibits no discharge. No scleral icterus.  Neck: Normal range of motion. Neck supple. No thyromegaly present.  Cardiovascular: Normal rate, regular rhythm, normal heart sounds and intact distal pulses.   No murmur heard. Pulmonary/Chest: Effort normal and breath sounds normal. No respiratory distress. She has no wheezes. She has no rales.  Abdominal: Soft. Bowel sounds are normal. She exhibits no distension and no mass. There is no tenderness.   Musculoskeletal: Normal range of motion. She exhibits no edema and no tenderness.  Lymphadenopathy:    She has no cervical adenopathy.  Neurological: She is alert and oriented to person, place, and time. She has normal reflexes. No cranial nerve deficit. Coordination normal.  Skin: Skin is warm and dry. No rash noted. She is not diaphoretic.  Psychiatric: Mood, memory and affect normal.    Lab Results  Component Value Date   TSH 2.029 03/17/2014   Lab Results  Component Value Date   WBC 9.9 03/17/2014   HGB 10.5* 03/17/2014   HCT 33.2* 03/17/2014   MCV 85.6 03/17/2014  PLT 627* 03/17/2014   Lab Results  Component Value Date   CREATININE 1.01 03/17/2014   BUN 22 03/17/2014   NA 140 03/17/2014   K 5.1 03/17/2014   CL 104 03/17/2014   CO2 28 03/17/2014   Lab Results  Component Value Date   ALT 18 03/17/2014   AST 18 03/17/2014   ALKPHOS 55 03/17/2014   BILITOT 0.7 03/17/2014   Lab Results  Component Value Date   CHOL 177 03/17/2014   Lab Results  Component Value Date   HDL 53 03/17/2014   Lab Results  Component Value Date   LDLCALC 96 03/17/2014   Lab Results  Component Value Date   TRIG 140 03/17/2014   Lab Results  Component Value Date   CHOLHDL 3.3 03/17/2014     Assessment & Plan  Hypertension Well controlled, no changes to meds. Encouraged heart healthy diet such as the DASH diet and exercise as tolerated.   Diabetes mellitus Reports glucose range of 64 to 230s, minimize simple carbs and continue current meds for now, A1C is 7.8  Pain in joint, shoulder region Has been doing some increased physical lifting and the pain is worse with movement. Is started on a Medrol dosepak and encouraged to apply moist heat and topical treatments such as Gale Journey Pas consider referral for PT or sports med if pain persists. Given rx for Flexeril 5 mg qhs to try prn  Thrombocytosis Persistent with anemia, referred to hematology for further work up  Acute bronchitis Symptoms worse  over past week, started on Levaquin, Medrol dosepak and given Albuterol CXR ordered but patient did not proceed, patient is describing a long years long history of worsening SOB, worse with exertion and acute illness. Has a distant history of cigarette smoking having quit a 1 ppd habit roughly 30 years ago. Unfortunately she continues to live with a smoker. Will refer to pulmonology for evaluation of possibile COPD  Unspecified hereditary and idiopathic peripheral neuropathy Tolerable on Gabapentin  Depression with anxiety Patient acknowledges recent increase in stressors but declines meds or counseling for now, will let us know if that changes  Medicare annual wellness visit, subsequent Patient denies any difficulties at home. No trouble with ADLs, depression or falls. No recent changes to vision or hearing. Is UTD with immunizations. Is UTD with screening. Discussed Advanced Directives, patient agrees to bring [place] copies of documents if can. Encouraged heart healthy diet, exercise as tolerated and adequate sleep. Given Pneumovax and flu shot today. Annual labs ordered and reviewed. Sees opthamology MGM was 12/2013  Pap 2012

## 2014-03-21 NOTE — Telephone Encounter (Signed)
Message copied by Varney Daily on Mon Mar 21, 2014  1:56 PM ------      Message from: Penni Homans A      Created: Mon Mar 21, 2014  1:29 PM       See if they can add hgba1c to her labs some how I missed this. ------

## 2014-03-21 NOTE — Progress Notes (Signed)
Pre visit review using our clinic review tool, if applicable. No additional management support is needed unless otherwise documented below in the visit note. 

## 2014-03-23 ENCOUNTER — Encounter: Payer: Self-pay | Admitting: Family Medicine

## 2014-03-23 DIAGNOSIS — J209 Acute bronchitis, unspecified: Secondary | ICD-10-CM | POA: Insufficient documentation

## 2014-03-23 DIAGNOSIS — M25519 Pain in unspecified shoulder: Secondary | ICD-10-CM | POA: Insufficient documentation

## 2014-03-23 DIAGNOSIS — Z0000 Encounter for general adult medical examination without abnormal findings: Secondary | ICD-10-CM | POA: Insufficient documentation

## 2014-03-23 HISTORY — DX: Encounter for general adult medical examination without abnormal findings: Z00.00

## 2014-03-23 NOTE — Assessment & Plan Note (Addendum)
Has been doing some increased physical lifting and the pain is worse with movement. Is started on a Medrol dosepak and encouraged to apply moist heat and topical treatments such as Gale Journey Pas consider referral for PT or sports med if pain persists. Given rx for Flexeril 5 mg qhs to try prn

## 2014-03-23 NOTE — Assessment & Plan Note (Signed)
Persistent with anemia, referred to hematology for further work up

## 2014-03-23 NOTE — Assessment & Plan Note (Addendum)
Patient denies any difficulties at home. No trouble with ADLs, depression or falls. No recent changes to vision or hearing. Is UTD with immunizations. Is UTD with screening. Discussed Advanced Directives, patient agrees to bring [place] copies of documents if can. Encouraged heart healthy diet, exercise as tolerated and adequate sleep. Given Pneumovax and flu shot today. Annual labs ordered and reviewed. Sees opthamology MGM was 12/2013  Pap 2012

## 2014-03-23 NOTE — Assessment & Plan Note (Signed)
Tolerable on Gabapentin

## 2014-03-23 NOTE — Assessment & Plan Note (Signed)
Symptoms worse over past week, started on Levaquin, Medrol dosepak and given Albuterol CXR ordered but patient did not proceed, patient is describing a long years long history of worsening SOB, worse with exertion and acute illness. Has a distant history of cigarette smoking having quit a 1 ppd habit roughly 30 years ago. Unfortunately she continues to live with a smoker. Will refer to pulmonology for evaluation of possibile COPD

## 2014-03-23 NOTE — Assessment & Plan Note (Signed)
Patient acknowledges recent increase in stressors but declines meds or counseling for now, will let us know if that changes

## 2014-04-27 ENCOUNTER — Encounter: Payer: Self-pay | Admitting: Emergency Medicine

## 2014-04-27 ENCOUNTER — Ambulatory Visit (INDEPENDENT_AMBULATORY_CARE_PROVIDER_SITE_OTHER)
Admission: RE | Admit: 2014-04-27 | Discharge: 2014-04-27 | Disposition: A | Discharge status: Home / Self Care | Payer: MEDICARE | Source: Ambulatory Visit | Attending: Emergency Medicine | Admitting: Emergency Medicine

## 2014-04-27 ENCOUNTER — Ambulatory Visit (INDEPENDENT_AMBULATORY_CARE_PROVIDER_SITE_OTHER): Payer: MEDICARE | Admitting: Emergency Medicine

## 2014-04-27 VITALS — BP 184/98 | HR 83 | Temp 98.5°F | Ht 61.5 in | Wt 196.8 lb

## 2014-04-27 DIAGNOSIS — R0989 Other specified symptoms and signs involving the circulatory and respiratory systems: Secondary | ICD-10-CM

## 2014-04-27 DIAGNOSIS — R0609 Other forms of dyspnea: Secondary | ICD-10-CM | POA: Diagnosis not present

## 2014-04-27 DIAGNOSIS — R0602 Shortness of breath: Secondary | ICD-10-CM | POA: Diagnosis not present

## 2014-04-27 DIAGNOSIS — R0600 Dyspnea, unspecified: Secondary | ICD-10-CM

## 2014-04-27 DIAGNOSIS — R0789 Other chest pain: Secondary | ICD-10-CM | POA: Diagnosis not present

## 2014-04-27 NOTE — Assessment & Plan Note (Signed)
At least some contribution of her weight gain but we will need to rule out true airflow obstruction for other causes of restriction. We will start with full PFT, a chest x-ray and walking oximetry. I will stop her albuterol since this appears to be related to headache and I am concerned that it is affecting her blood pressure control. We may need to find an alternative depending on her pulmonary function testing

## 2014-04-27 NOTE — Progress Notes (Signed)
Subjective:    Patient ID: Beth Maldonado, female    DOB: 19-Nov-1945, 68 y.o.   MRN: [id number]  HPI 68 yo former smoker (15 pk-yrs), hx of DM, HTN, allergic rhinitis, depression. She is referred by Dr Beth Maldonado for dyspnea. Beth Maldonado describes an acute on chronic progressive SOB, noticeable for the last several years but much more over the last several weeks. She now has some SOB just when walking through the house. Her BP is elevated today - usually under good control. On a few occasions she has had some associated CP, but it sounded MSK. She has cough, productive of mucous in the am. She hears wheezing occasionally. She was given albuterol as a trial recently > may have helped her, but it has given her a HA and some sore throat. She has put on weight - about  30 lbs over a year.     Review of Systems  Respiratory: Positive for cough and shortness of breath.   Neurological: Positive for headaches.   Past Medical History  Diagnosis Date  . Osteopenia   . Diabetes mellitus   . Hypertension   . Acute bronchitis 03/29/2013  . Depression 06/27/2013  . Allergy   . Arthritis   . Depression with anxiety 06/27/2013  . Medicare annual wellness visit, subsequent 03/23/2014     Family History  Problem Relation Age of Onset  . Diabetes Mother   . Heart attack Father   . Heart disease Father     4 and 41 MI  . Diabetes Sister   . Sickle cell anemia Sister   . Arthritis Sister   . Hypertension Sister   . Diabetes Sister   . Thyroid disease Sister   . Arthritis Sister   . Glaucoma Sister      History   Social History  . Marital Status: Married    Spouse Name: N/A    Number of Children: 1  . Years of Education: N/A   Occupational History  . retired    Social History Main Topics  . Smoking status: Former Smoker -- 1.00 packs/day for 15 years    Types: Cigarettes  . Smokeless tobacco: Former Systems developer    Quit date: 04/27/1989  . Alcohol Use: Yes     Comment: Rare  . Drug Use: No  .  Sexual Activity: No     Comment: lives with husband, no dietary restrictions   Other Topics Concern  . Not on file   Social History Narrative  . No narrative on file  has worked as an Futures trader;  No water damage  Allergies  Allergen Reactions  . Penicillins      Outpatient Prescriptions Prior to Visit  Medication Sig Dispense Refill  . albuterol (PROVENTIL HFA;VENTOLIN HFA) 108 (90 BASE) MCG/ACT inhaler Inhale 2 puffs into the lungs every 6 (six) hours as needed for wheezing or shortness of breath.  1 Inhaler  0  . aspirin 81 MG tablet Take 81 mg by mouth daily.        . cyclobenzaprine (FLEXERIL) 5 MG tablet Take 1 tablet (5 mg total) by mouth at bedtime as needed for muscle spasms.  30 tablet  1  . escitalopram (LEXAPRO) 20 MG tablet Take 1 tablet by mouth  daily  30 tablet  2  . gabapentin (NEURONTIN) 300 MG capsule TAKE 2 CAPSULES BY MOUTH 3 TIMES A DAY  180 capsule  3  . hydrochlorothiazide (HYDRODIURIL) 25 MG tablet TAKE 1 TABLET BY  MOUTH DAILY.  30 tablet  3  . hydrOXYzine (ATARAX/VISTARIL) 25 MG tablet TAKE 1 TABLET BY MOUTH TWICE A DAY  60 tablet  3  . Insulin Lispro Prot & Lispro (HUMALOG 75/25 MIX) (75-25) 100 UNIT/ML Kwikpen Take 14 units in the morning and 22 units in the evening.  15 mL    . Insulin Pen Needle 32G X 4 MM MISC Use for insulin injection twice a day 250.00  100 each  2  . Krill Oil CAPS MegaRed caps daily      . losartan (COZAAR) 100 MG tablet TAKE 1 TABLET BY MOUTH ONCE A DAY  30 tablet  3  . metFORMIN (GLUCOPHAGE) 500 MG tablet TAKE 2 TABLETS BY MOUTH TWICE A DAY WITH MEALS  120 tablet  3  . Multiple Vitamin (MULTIVITAMIN) tablet Take 1 tablet by mouth daily.      Marland Kitchen pyridOXINE (VITAMIN B-6) 100 MG tablet Take 100 mg by mouth daily.        Marland Kitchen levofloxacin (LEVAQUIN) 500 MG tablet Take 1 tablet (500 mg total) by mouth daily.  7 tablet  0  . methylPREDNIsolone (MEDROL DOSPACK) 4 MG tablet follow package directions  21 tablet  0   No  facility-administered medications prior to visit.         Objective:   Physical Exam Filed Vitals:   04/27/14 1441  BP: 184/98  Pulse: 83  Temp: 98.5 F (36.9 C)  TempSrc: Oral  Height: 5' 1.5" (1.562 m)  Weight: 196 lb 12.8 oz (89.268 kg)  SpO2: 97%   Gen: very pleasant, overwt woman, in no distress,  normal affect  ENT: No lesions,  mouth clear,  oropharynx clear, no postnasal drip  Neck: No JVD, no TMG, no carotid bruits, no stridor  Lungs: No use of accessory muscles, clear without rales or rhonchi  Cardiovascular: RRR, heart sounds normal, no murmur or gallops, no peripheral edema  Musculoskeletal: No deformities, no cyanosis or clubbing  Neuro: alert, non focal  Skin: Warm, no lesions or rashes   CBC    Component Value Date/Time   WBC 9.9 03/17/2014 0813   RBC 3.88 03/17/2014 0813   HGB 10.5* 03/17/2014 0813   HCT 33.2* 03/17/2014 0813   PLT 627* 03/17/2014 0813   MCV 85.6 03/17/2014 0813   MCH 27.1 03/17/2014 0813   MCHC 31.6 03/17/2014 0813   RDW 15.1 03/17/2014 0813   LYMPHSABS 2.6 03/17/2014 0813   MONOABS 0.7 03/17/2014 0813   EOSABS 1.1* 03/17/2014 0813   BASOSABS 0.1 03/17/2014 0813    BMET    Component Value Date/Time   NA 140 03/17/2014 0813   K 5.1 03/17/2014 0813   CL 104 03/17/2014 0813   CO2 28 03/17/2014 0813   GLUCOSE 124* 03/17/2014 0813   BUN 22 03/17/2014 0813   CREATININE 1.01 03/17/2014 0813   CREATININE 1.0 11/20/2010 0853   CALCIUM 9.5 03/17/2014 0813   TSH 8/20 >> 2.03       Assessment & Plan:  Dyspnea  At least some contribution of her weight gain but we will need to rule out true airflow obstruction for other causes of restriction. We will start with full PFT, a chest x-ray and walking oximetry. I will stop her albuterol since this appears to be related to headache and I am concerned that it is affecting her blood pressure control. We may need to find an alternative depending on her pulmonary function testing

## 2014-04-27 NOTE — Patient Instructions (Signed)
We will perform full pulmonary function testing at your next visit We will perform a chest x-ray today Walking oximetry today Follow with Dr. Lamonte Sakai next available with full PFTs

## 2014-05-03 ENCOUNTER — Telehealth: Payer: Self-pay | Admitting: Hematology & Oncology

## 2014-05-03 NOTE — Telephone Encounter (Signed)
Left vm w NEW PATIENT today to remind them of their appointment with Dr. Ennever. Also, advised them to bring all medication bottles and insurance card information. ° °

## 2014-05-04 ENCOUNTER — Ambulatory Visit (HOSPITAL_BASED_OUTPATIENT_CLINIC_OR_DEPARTMENT_OTHER): Payer: MEDICARE | Admitting: Hematology & Oncology

## 2014-05-04 ENCOUNTER — Other Ambulatory Visit (HOSPITAL_BASED_OUTPATIENT_CLINIC_OR_DEPARTMENT_OTHER): Payer: MEDICARE | Admitting: Lab

## 2014-05-04 ENCOUNTER — Encounter: Payer: Self-pay | Admitting: Hematology & Oncology

## 2014-05-04 ENCOUNTER — Other Ambulatory Visit: Payer: Self-pay | Admitting: Lab

## 2014-05-04 ENCOUNTER — Ambulatory Visit: Payer: MEDICARE

## 2014-05-04 VITALS — BP 151/69 | HR 93 | Temp 97.6°F | Resp 14 | Ht 62.0 in | Wt 192.0 lb

## 2014-05-04 DIAGNOSIS — D75839 Thrombocytosis, unspecified: Secondary | ICD-10-CM

## 2014-05-04 DIAGNOSIS — D473 Essential (hemorrhagic) thrombocythemia: Secondary | ICD-10-CM

## 2014-05-04 DIAGNOSIS — D518 Other vitamin B12 deficiency anemias: Secondary | ICD-10-CM | POA: Diagnosis not present

## 2014-05-04 DIAGNOSIS — D469 Myelodysplastic syndrome, unspecified: Secondary | ICD-10-CM

## 2014-05-04 DIAGNOSIS — E119 Type 2 diabetes mellitus without complications: Secondary | ICD-10-CM

## 2014-05-04 LAB — CBC WITH DIFFERENTIAL (CANCER CENTER ONLY)
BASO#: 0.1 10*3/uL (ref 0.0–0.2)
BASO%: 0.6 % (ref 0.0–2.0)
EOS ABS: 1.5 10*3/uL — AB (ref 0.0–0.5)
EOS%: 11.8 % — ABNORMAL HIGH (ref 0.0–7.0)
HCT: 39 % (ref 34.8–46.6)
HGB: 12.3 g/dL (ref 11.6–15.9)
LYMPH#: 3.5 10*3/uL — ABNORMAL HIGH (ref 0.9–3.3)
LYMPH%: 28.2 % (ref 14.0–48.0)
MCH: 28.5 pg (ref 26.0–34.0)
MCHC: 31.5 g/dL — ABNORMAL LOW (ref 32.0–36.0)
MCV: 90 fL (ref 81–101)
MONO#: 0.9 10*3/uL (ref 0.1–0.9)
MONO%: 7.2 % (ref 0.0–13.0)
NEUT#: 6.4 10*3/uL (ref 1.5–6.5)
NEUT%: 52.2 % (ref 39.6–80.0)
PLATELETS: 663 10*3/uL — AB (ref 145–400)
RBC: 4.32 10*6/uL (ref 3.70–5.32)
RDW: 15.2 % (ref 11.1–15.7)
WBC: 12.3 10*3/uL — ABNORMAL HIGH (ref 3.9–10.0)

## 2014-05-04 LAB — CHCC SATELLITE - SMEAR

## 2014-05-04 LAB — IRON AND TIBC CHCC
%SAT: 21 % (ref 21–57)
IRON: 77 ug/dL (ref 41–142)
TIBC: 363 ug/dL (ref 236–444)
UIBC: 286 ug/dL (ref 120–384)

## 2014-05-04 LAB — FERRITIN CHCC: FERRITIN: 20 ng/mL (ref 9–269)

## 2014-05-04 NOTE — Addendum Note (Signed)
Addended by: Burney Gauze R on: 05/04/2014 02:29 PM   Modules accepted: Orders

## 2014-05-04 NOTE — Progress Notes (Signed)
Referral MD  Reason for Referral: Thrombocytosis   Chief Complaint  Patient presents with  . NEW PATIENT  : My blood is abnormal  HPI: Beth Maldonado is a very nice 68 year old Afro-American female. She has a history of diabetes. She is an insulin-dependent.  She has had a history of partial hysterectomy. This is back probably 40 years ago. This is of her menometrorrhagia. She has had a history of breast reduction.  She's been noted to have thrombocytosis over the past couple years. She's had no problems with headache. She's had no rashes. She's had no burning in the hands or feet.  There's been no problems with weight loss or weight gain.  She gets her routine mammograms. She never has had a colonoscopy.  She has not stayed any change in bowel or bladder habits. There's been no melena or bright red blood per rectum.  Come back through her records, her platelet count was elevated a couple years ago. Back in the 2013, she was noted to have a platelet count of 684,000. Her white cell count was 10 and hemoglobin was 11.2.  Last year, her white cell count was 7.2. Hemoglobin 11.5. The platelet count was 510,000. Her MCV has always been normal.  She denies any problem with sickle cell issues. She says that there is the trait in the family.  Because of the persistent thrombocytosis, she is, referred to the [facility] for an evaluation.  She stopped smoking about 30 years ago. She does not drink. She is currently retired from [employer] education [employer].  Overall, her performance status is ECOG 0      Past Medical History  Diagnosis Date  . Osteopenia   . Diabetes mellitus   . Hypertension   . Acute bronchitis 03/29/2013  . Depression 06/27/2013  . Allergy   . Arthritis   . Depression with anxiety 06/27/2013  . Medicare annual wellness visit, subsequent 03/23/2014  :  Past Surgical History  Procedure Laterality Date  . Cataract extraction, bilateral    . Laparoscopy      rt knee  .  Breast surgery      Reduction  . Vaginal hysterectomy      partial  . Tubal ligation    . Hemorrhoid surgery    :  Current outpatient prescriptions:aspirin 81 MG tablet, Take 81 mg by mouth daily.  , Disp: , Rfl: ;  escitalopram (LEXAPRO) 20 MG tablet, Take 1 tablet by mouth  daily, Disp: 30 tablet, Rfl: 2;  gabapentin (NEURONTIN) 300 MG capsule, TAKE 2 CAPSULES BY MOUTH 3 TIMES A DAY, Disp: 180 capsule, Rfl: 3;  hydrochlorothiazide (HYDRODIURIL) 25 MG tablet, TAKE 1 TABLET BY MOUTH DAILY., Disp: 30 tablet, Rfl: 3 hydrOXYzine (ATARAX/VISTARIL) 25 MG tablet, TAKE 1 TABLET BY MOUTH TWICE A DAY, Disp: 60 tablet, Rfl: 3;  Insulin Lispro Prot & Lispro (HUMALOG 75/25 MIX) (75-25) 100 UNIT/ML Kwikpen, Take 14 units in the morning and 22 units in the evening., Disp: 15 mL, Rfl: ;  Insulin Pen Needle 32G X 4 MM MISC, Use for insulin injection twice a day 250.00, Disp: 100 each, Rfl: 2;  Krill Oil CAPS, MegaRed caps daily, Disp: , Rfl:  losartan (COZAAR) 100 MG tablet, TAKE 1 TABLET BY MOUTH ONCE A DAY, Disp: 30 tablet, Rfl: 3;  metFORMIN (GLUCOPHAGE) 500 MG tablet, TAKE 2 TABLETS BY MOUTH TWICE A DAY WITH MEALS, Disp: 120 tablet, Rfl: 3;  Multiple Vitamin (MULTIVITAMIN) tablet, Take 1 tablet by mouth daily., Disp: , Rfl: ;  pyridOXINE (VITAMIN B-6) 100 MG tablet, Take 100 mg by mouth daily.  , Disp: , Rfl: :  :  Allergies  Allergen Reactions  . Penicillins   :  Family History  Problem Relation Age of Onset  . Diabetes Mother   . Heart attack Father   . Heart disease Father     64 and 78 MI  . Diabetes Sister   . Sickle cell anemia Sister   . Arthritis Sister   . Hypertension Sister   . Diabetes Sister   . Thyroid disease Sister   . Arthritis Sister   . Glaucoma Sister   :  History   Social History  . Marital Status: Married    Spouse Name: N/A    Number of Children: 1  . Years of Education: N/A   Occupational History  . retired    Social History Main Topics  . Smoking status:  Former Smoker -- 1.00 packs/day for 20 years    Types: Cigarettes    Start date: 10/03/1966    Quit date: 11/03/1986  . Smokeless tobacco: Never Used     Comment: quit 28 years ago  . Alcohol Use: Yes     Comment: Rare  . Drug Use: No  . Sexual Activity: No     Comment: lives with husband, no dietary restrictions   Other Topics Concern  . Not on file   Social History Narrative  . No narrative on file  :  Pertinent items are noted in HPI.  Exam: @IPVITALS @  well-developed and well-nourished Afro-American female in no obvious distress. Vital signs are temperature of 97.6. Pulse 93. Blood pressure 151/69. Weight is 192 pounds. Head and exam shows no ocular or oral lesions. She has no palpable cervical or supraclavicular lymph nodes. Lungs are clear. Cardiac exam regular in rhythm with no murmurs, rubs or bruits. Abdomen is soft. She has good bowel sounds. There is no fluid wave. There is a palpable liver or spleen tip. Back exam no tenderness over the spine, ribs or hips. Extremities shows no clubbing, cyanosis or edema. This is the right most of her joints. She has good strength in her extremities. Skin exam shows no rashes, ecchymosis or petechia. There is no erythema on the hands or feet. Neurological exam is nonfocal.    Recent Labs  05/04/14 1145  WBC 12.3*  HGB 12.3  HCT 39.0  PLT 663*   No results found for this basename: NA, K, CL, CO2, GLUCOSE, BUN, CREATININE, CALCIUM,  in the last 72 hours  Blood smear review: Normochromic and normocytic red blood cells. There are no target cells. There is no teardrop cells. I see no nucleated red blood cells. She is no rouleau formation. White cells appear normal in morphology maturation. There are no hypersegmented polys. There is no immature myeloid or lymphoid forms. There are no atypical lymphocytes. Platelets are increased in number. She has several large platelets. Platelets are well granulated.  Pathology: None     Assessment  and Plan: Beth Maldonado is a 68 year old Afro-American female. 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 She had mild anemia.  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 She has several large platelets. She has a mild anemia which goes along with thrombocythemia.  I cannot feel her spleen nor do I see nucleated red cells on the blood smear so I don't think myelofibrosis is a problem.  I don't think polycythemia is the problem with iron deficiency.  I'm sending off her blood for several genetic test. Will send the blood off for JAK2 and Calreticulin and MPL515. These typically are mutated with thrombocythemia.  She is on baby aspirin. I will keep her on that for right now.  She has absolute had no problems with this so far. Again, by the lab work, she's had this for a couple years. I don't see any suggestion of progression to more active myeloproliferative problem.  I spent a good 45 minutes with her. I reviewed her lab work. I gave her a prayer blanket. I explained to her what thought she had. I wrote down the name on my business card.  I answered all her questions.  I will make an appointment for her once I get the results back from her lab work.

## 2014-05-06 ENCOUNTER — Ambulatory Visit: Payer: MEDICARE | Admitting: Family Medicine

## 2014-05-06 LAB — COMPREHENSIVE METABOLIC PANEL
ALT: 17 U/L (ref 0–35)
AST: 16 U/L (ref 0–37)
Albumin: 4.6 g/dL (ref 3.5–5.2)
Alkaline Phosphatase: 70 U/L (ref 39–117)
BILIRUBIN TOTAL: 0.8 mg/dL (ref 0.2–1.2)
BUN: 17 mg/dL (ref 6–23)
CO2: 29 mEq/L (ref 19–32)
Calcium: 10.4 mg/dL (ref 8.4–10.5)
Chloride: 101 mEq/L (ref 96–112)
Creatinine, Ser: 1.16 mg/dL — ABNORMAL HIGH (ref 0.50–1.10)
GLUCOSE: 151 mg/dL — AB (ref 70–99)
Potassium: 4.2 mEq/L (ref 3.5–5.3)
SODIUM: 142 meq/L (ref 135–145)
TOTAL PROTEIN: 8 g/dL (ref 6.0–8.3)

## 2014-05-06 LAB — VITAMIN B12: Vitamin B-12: 1111 pg/mL — ABNORMAL HIGH (ref 211–911)

## 2014-05-06 LAB — RETICULOCYTES (CHCC)
ABS Retic: 73.8 10*3/uL (ref 19.0–186.0)
RBC.: 4.34 MIL/uL (ref 3.87–5.11)
Retic Ct Pct: 1.7 % (ref 0.4–2.3)

## 2014-05-06 LAB — ERYTHROPOIETIN: Erythropoietin: 11 m[IU]/mL (ref 2.6–18.5)

## 2014-05-07 LAB — CALRETICULIN (CALR) MUTATION ANALYSIS: CALR MUTATION (EXON 9): NOT DETECTED

## 2014-05-12 ENCOUNTER — Other Ambulatory Visit: Payer: Self-pay | Admitting: Family Medicine

## 2014-05-23 ENCOUNTER — Ambulatory Visit (INDEPENDENT_AMBULATORY_CARE_PROVIDER_SITE_OTHER): Payer: MEDICARE | Admitting: Family Medicine

## 2014-05-23 ENCOUNTER — Encounter: Payer: Self-pay | Admitting: Family Medicine

## 2014-05-23 VITALS — BP 145/87 | HR 85 | Temp 98.0°F | Ht 62.0 in | Wt 194.2 lb

## 2014-05-23 DIAGNOSIS — D75839 Thrombocytosis, unspecified: Secondary | ICD-10-CM

## 2014-05-23 DIAGNOSIS — R0600 Dyspnea, unspecified: Secondary | ICD-10-CM | POA: Diagnosis not present

## 2014-05-23 DIAGNOSIS — E785 Hyperlipidemia, unspecified: Secondary | ICD-10-CM

## 2014-05-23 DIAGNOSIS — I10 Essential (primary) hypertension: Secondary | ICD-10-CM

## 2014-05-23 DIAGNOSIS — M542 Cervicalgia: Secondary | ICD-10-CM | POA: Diagnosis not present

## 2014-05-23 DIAGNOSIS — D473 Essential (hemorrhagic) thrombocythemia: Secondary | ICD-10-CM

## 2014-05-23 DIAGNOSIS — E1169 Type 2 diabetes mellitus with other specified complication: Secondary | ICD-10-CM | POA: Diagnosis not present

## 2014-05-23 MED ORDER — INSULIN LISPRO PROT & LISPRO (75-25 MIX) 100 UNIT/ML KWIKPEN: PEN_INJECTOR | SUBCUTANEOUS | Status: DC

## 2014-05-23 MED ORDER — TRIAMTERENE-HCTZ 37.5-25 MG PO TABS: 1.0000 | ORAL_TABLET | Freq: Every day | ORAL | Status: DC

## 2014-05-23 NOTE — Progress Notes (Signed)
Pre visit review using our clinic review tool, if applicable. No additional management support is needed unless otherwise documented below in the visit note. 

## 2014-05-26 ENCOUNTER — Telehealth: Payer: Self-pay | Admitting: Family Medicine

## 2014-05-26 NOTE — Telephone Encounter (Signed)
Caller name: Kelleen Relation to pt: Call back Calcium:  Reason for call:  Pt was in on Monday, computers down, wants to know results from Dr. Lamonte Sakai and Dr. Elnoria Howard.  Pt is concerned about results.

## 2014-05-26 NOTE — Telephone Encounter (Signed)
Please advise? Does patient need to contact Dr Lamonte Sakai and Dr Antonieta Pert office to get results?

## 2014-05-26 NOTE — Telephone Encounter (Signed)
Hematology sent off genetic tests that can take a long to com eback, they will follow up with her when it comes back and pulmonology at beginning of week, I cannot see any acute concerns but she should follow up with them to hear further options.

## 2014-05-26 NOTE — Telephone Encounter (Signed)
LVM for pt to call back.  Give pt MD advice.

## 2014-05-26 NOTE — Telephone Encounter (Signed)
Will you please inform pt of mds message

## 2014-05-29 ENCOUNTER — Encounter: Payer: Self-pay | Admitting: Family Medicine

## 2014-05-29 DIAGNOSIS — M542 Cervicalgia: Secondary | ICD-10-CM

## 2014-05-29 HISTORY — DX: Cervicalgia: M54.2

## 2014-05-29 NOTE — Assessment & Plan Note (Signed)
Patient has seen pulmonology and no pulmonary cause of her wheezing or dyspnea was identified.

## 2014-05-29 NOTE — Assessment & Plan Note (Signed)
Mildly elevated but patient very anxious will not change meds today

## 2014-05-29 NOTE — Assessment & Plan Note (Signed)
Is following with hematology continue same for now

## 2014-05-29 NOTE — Assessment & Plan Note (Signed)
Encouraged heart healthy diet, increase exercise, avoid trans fats, consider a krill oil cap daily 

## 2014-05-29 NOTE — Progress Notes (Signed)
Beth Maldonado [id number] May 27, 1946 05/29/2014      Progress Note-Follow Up  Subjective  Chief Complaint  Chief Complaint  Patient presents with  . Follow-up    from Pulmonary visit    HPI  Patient is a 68 year old female in today for routine medical care. SHe has numerous complaints. She continues to struggle with dyspnea, coughing and wheezing. So far no pulmonary cause has been identified but she does note Albuterol seems to help when she uses it. She his struggling with left neck pain and radicular symptoms down left arm intermittently. C/o poor sleep, c/o painful prickling in feet. No CP/palp. No recent febrile illness   Past Medical History  Diagnosis Date  . Osteopenia   . Diabetes mellitus   . Hypertension   . Acute bronchitis 03/29/2013  . Depression 06/27/2013  . Allergy   . Arthritis   . Depression with anxiety 06/27/2013  . Medicare annual wellness visit, subsequent 03/23/2014    Past Surgical History  Procedure Laterality Date  . Cataract extraction, bilateral    . Laparoscopy      rt knee  . Breast surgery      Reduction  . Vaginal hysterectomy      partial  . Tubal ligation    . Hemorrhoid surgery      Family History  Problem Relation Age of Onset  . Diabetes Mother   . Heart attack Father   . Heart disease Father     38 and 22 MI  . Diabetes Sister   . Sickle cell anemia Sister   . Arthritis Sister   . Hypertension Sister   . Diabetes Sister   . Thyroid disease Sister   . Arthritis Sister   . Glaucoma Sister     History   Social History  . Marital Status: Married    Spouse Name: N/A    Number of Children: 1  . Years of Education: N/A   Occupational History  . retired    Social History Main Topics  . Smoking status: Former Smoker -- 1.00 packs/day for 20 years    Types: Cigarettes    Start date: 10/03/1966    Quit date: 11/03/1986  . Smokeless tobacco: Never Used     Comment: quit 28 years ago  . Alcohol Use: Yes     Comment:  Rare  . Drug Use: No  . Sexual Activity: No     Comment: lives with husband, no dietary restrictions   Other Topics Concern  . Not on file   Social History Narrative  . No narrative on file    Current Outpatient Prescriptions on File Prior to Visit  Medication Sig Dispense Refill  . aspirin 81 MG tablet Take 81 mg by mouth daily.      Marland Kitchen escitalopram (LEXAPRO) 20 MG tablet Take 1 tablet by mouth  daily 30 tablet 3  . gabapentin (NEURONTIN) 300 MG capsule TAKE 2 CAPSULES BY MOUTH 3 TIMES A DAY 180 capsule 3  . hydrOXYzine (ATARAX/VISTARIL) 25 MG tablet TAKE 1 TABLET BY MOUTH TWICE A DAY 60 tablet 3  . Insulin Pen Needle 32G X 4 MM MISC Use for insulin injection twice a day 250.00 100 each 2  . Krill Oil CAPS MegaRed caps daily    . losartan (COZAAR) 100 MG tablet TAKE 1 TABLET BY MOUTH ONCE A DAY 30 tablet 3  . metFORMIN (GLUCOPHAGE) 500 MG tablet TAKE 2 TABLETS BY MOUTH TWICE A DAY WITH MEALS 120  tablet 3  . Multiple Vitamin (MULTIVITAMIN) tablet Take 1 tablet by mouth daily.    Marland Kitchen pyridOXINE (VITAMIN B-6) 100 MG tablet Take 100 mg by mouth daily.       No current facility-administered medications on file prior to visit.    Allergies  Allergen Reactions  . Penicillins     Review of Systems  Review of Systems  Constitutional: Positive for malaise/fatigue. Negative for fever.  HENT: Negative for congestion.   Eyes: Negative for discharge.  Respiratory: Positive for cough, shortness of breath and wheezing.   Cardiovascular: Negative for chest pain, palpitations and leg swelling.  Gastrointestinal: Negative for nausea, abdominal pain and diarrhea.  Genitourinary: Negative for dysuria.  Musculoskeletal: Positive for joint pain and neck pain. Negative for falls.       Left sided neck pain with pain radiating down lef tarm, worse with movement  Skin: Negative for rash.  Neurological: Negative for loss of consciousness and headaches.  Endo/Heme/Allergies: Negative for polydipsia.   Psychiatric/Behavioral: Negative for depression and suicidal ideas. The patient is not nervous/anxious and does not have insomnia.     Objective  BP 145/87 mmHg  Pulse 85  Temp(Src) 98 F (36.7 C) (Oral)  Ht 5\' 2"  (1.575 m)  Wt 194 lb 3.2 oz (88.089 kg)  BMI 35.51 kg/m2  SpO2 100%  Physical Exam  Physical Exam  Constitutional: She is oriented to person, place, and time and well-developed, well-nourished, and in no distress. No distress.  HENT:  Head: Normocephalic and atraumatic.  Eyes: Conjunctivae are normal.  Neck: Neck supple. No thyromegaly present.  Cardiovascular: Normal rate, regular rhythm and normal heart sounds.   No murmur heard. Pulmonary/Chest: Effort normal and breath sounds normal. She has no wheezes.  Abdominal: She exhibits no distension and no mass.  Musculoskeletal: She exhibits no edema.  Lymphadenopathy:    She has no cervical adenopathy.  Neurological: She is alert and oriented to person, place, and time.  Skin: Skin is warm and dry. No rash noted. She is not diaphoretic.  Psychiatric: Memory, affect and judgment normal.    Lab Results  Component Value Date   TSH 2.029 03/17/2014   Lab Results  Component Value Date   WBC 12.3* 05/04/2014   HGB 12.3 05/04/2014   HCT 39.0 05/04/2014   MCV 90 05/04/2014   PLT 663* 05/04/2014   Lab Results  Component Value Date   CREATININE 1.16* 05/04/2014   BUN 17 05/04/2014   NA 142 05/04/2014   K 4.2 05/04/2014   CL 101 05/04/2014   CO2 29 05/04/2014   Lab Results  Component Value Date   ALT 17 05/04/2014   AST 16 05/04/2014   ALKPHOS 70 05/04/2014   BILITOT 0.8 05/04/2014   Lab Results  Component Value Date   CHOL 177 03/17/2014   Lab Results  Component Value Date   HDL 53 03/17/2014   Lab Results  Component Value Date   LDLCALC 96 03/17/2014   Lab Results  Component Value Date   TRIG 140 03/17/2014   Lab Results  Component Value Date   CHOLHDL 3.3 03/17/2014     Assessment  & Plan  Hypertension Mildly elevated but patient very anxious will not change meds today  Diabetes mellitus hgba1c acceptable, minimize simple carbs. Increase exercise as tolerated. Continue current meds  Dyspnea Patient has seen pulmonology and no pulmonary cause of her wheezing or dyspnea was identified.   Thrombocytosis Is following with hematology continue same for now  Hyperlipidemia Encouraged heart healthy diet, increase exercise, avoid trans fats, consider a krill oil cap daily  Neck pain on left side Ordered cervical spine xray today, moist heat and gentle stretching for now and refer at patient discretion if symptoms persisten or worsen

## 2014-05-29 NOTE — Assessment & Plan Note (Signed)
Ordered cervical spine xray today, moist heat and gentle stretching for now and refer at patient discretion if symptoms persisten or worsen

## 2014-05-29 NOTE — Assessment & Plan Note (Signed)
hgba1c acceptable, minimize simple carbs. Increase exercise as tolerated. Continue current meds 

## 2014-05-30 ENCOUNTER — Encounter: Payer: Self-pay | Admitting: Family Medicine

## 2014-05-31 ENCOUNTER — Ambulatory Visit: Payer: MEDICARE | Admitting: Emergency Medicine

## 2014-06-01 ENCOUNTER — Encounter: Payer: Self-pay | Admitting: Family Medicine

## 2014-06-13 ENCOUNTER — Telehealth: Payer: Self-pay | Admitting: Family Medicine

## 2014-06-13 ENCOUNTER — Other Ambulatory Visit: Payer: MEDICARE

## 2014-06-13 NOTE — Telephone Encounter (Signed)
Pt informed

## 2014-06-13 NOTE — Telephone Encounter (Signed)
Caller name: Zlata, Alcaide Relation to pt: self Call back number: [phone]   Reason for call:   Pt states she was in today 06/13/14 for lab work and there were no orders in, pt reschedule her follow up appointment to January. Pt would like to know if lab work needs to be done before January appointment. Please advise

## 2014-06-13 NOTE — Telephone Encounter (Signed)
Please inform pt that we now do labs at the time of visit

## 2014-06-14 ENCOUNTER — Telehealth: Payer: Self-pay | Admitting: *Deleted

## 2014-06-14 NOTE — Telephone Encounter (Addendum)
-----   Message from Volanda Napoleon, MD sent at 06/13/2014  5:41 PM EST ----- Please call and let her know that all of her tests are normal. I do not think that she has a bone marrow problem.I want to see her back in about 3 months. Please set this up with lab work. Thanks. Pete -Left voicemail informing pt to call our office back.

## 2014-06-14 NOTE — Telephone Encounter (Signed)
-----   Message from Volanda Napoleon, MD sent at 06/13/2014  5:41 PM EST ----- Please call and let her know that all of her tests are normal. I do not think that she has a bone marrow problem.I want to see her back in about 3 months. Please set this up with lab work. Thanks. Laurey Arrow

## 2014-06-16 ENCOUNTER — Telehealth: Payer: Self-pay | Admitting: Hematology & Oncology

## 2014-06-16 ENCOUNTER — Telehealth: Payer: Self-pay | Admitting: *Deleted

## 2014-06-16 NOTE — Telephone Encounter (Signed)
-----   Message from Volanda Napoleon, MD sent at 06/13/2014  5:41 PM EST ----- Please call and let her know that all of her tests are normal. I do not think that she has a bone marrow problem.I want to see her back in about 3 months. Please set this up with lab work. Thanks. Laurey Arrow

## 2014-06-16 NOTE — Telephone Encounter (Signed)
Pt aware of 2-18 appointment °

## 2014-06-20 ENCOUNTER — Ambulatory Visit: Payer: MEDICARE | Admitting: Family Medicine

## 2014-06-27 ENCOUNTER — Other Ambulatory Visit: Payer: Self-pay | Admitting: Family Medicine

## 2014-07-12 ENCOUNTER — Telehealth: Payer: Self-pay | Admitting: *Deleted

## 2014-07-12 NOTE — Telephone Encounter (Signed)
Caller name: Tonnie Relation to pt: self Call back number: [phone] Pharmacy: [facility] [address]  Reason for call: Pt was given prescription for Insulin Lispro Prot & Lispro (HUMALOG 75/25 MIX) (75-25) 100 UNIT/ML Kwikpen on 05/23/2014. She waited until she got low to try to get it filled.  She has taken the prescription downstairs today to the [facility] after being told by Dr. Charlett Blake it would be cheaper.  She was told it would cost over $200.  She can't afford that.  Is there something else she can be prescribed that would be cheaper?  She has enough insulin for tonight's dose and will be out.  Please advise.

## 2014-07-13 NOTE — Telephone Encounter (Signed)
I would not have told it would be cheaper. I never can predict the cost of meds any more. It is possible I said it might be cheaper. Please check with pharmacy and see what is cheapest for her. I think plain Humalog has a good coupon now. There may be a coupon for Levemir or Lantus as well and then I could switch her to a basal bolus regimen. Sorry

## 2014-07-13 NOTE — Telephone Encounter (Signed)
Please see message below

## 2014-07-13 NOTE — Telephone Encounter (Signed)
Please advise any recommendations .

## 2014-07-14 ENCOUNTER — Other Ambulatory Visit: Payer: Self-pay | Admitting: Family Medicine

## 2014-07-14 NOTE — Telephone Encounter (Signed)
Have contacted Humalog rep to see if there is a current coupon or savings card.  Awaiting reply.

## 2014-07-14 NOTE — Telephone Encounter (Signed)
3 pens have been put aside along with a coupon for the patient. Advised that she could come by and pick them up.

## 2014-08-02 ENCOUNTER — Other Ambulatory Visit: Payer: Self-pay | Admitting: Family Medicine

## 2014-08-02 NOTE — Telephone Encounter (Signed)
Requesting Gabapentin 300mg -Take 2 capsules by mouth 3 times a day. Last refill:02/23/14;#180,3 Last OV:05/23/14-has appt:08/05/14 Please advise.//AB/CMA

## 2014-08-05 ENCOUNTER — Ambulatory Visit: Payer: MEDICARE | Admitting: Family Medicine

## 2014-08-16 ENCOUNTER — Encounter: Payer: Self-pay | Admitting: Family Medicine

## 2014-08-16 ENCOUNTER — Ambulatory Visit (INDEPENDENT_AMBULATORY_CARE_PROVIDER_SITE_OTHER): Payer: MEDICARE | Admitting: Family Medicine

## 2014-08-16 VITALS — BP 142/70 | HR 85 | Temp 97.8°F | Ht 62.0 in | Wt 193.4 lb

## 2014-08-16 DIAGNOSIS — E1169 Type 2 diabetes mellitus with other specified complication: Secondary | ICD-10-CM

## 2014-08-16 DIAGNOSIS — I10 Essential (primary) hypertension: Secondary | ICD-10-CM

## 2014-08-16 DIAGNOSIS — E119 Type 2 diabetes mellitus without complications: Secondary | ICD-10-CM

## 2014-08-16 DIAGNOSIS — E669 Obesity, unspecified: Secondary | ICD-10-CM

## 2014-08-16 DIAGNOSIS — E785 Hyperlipidemia, unspecified: Secondary | ICD-10-CM

## 2014-08-16 DIAGNOSIS — E118 Type 2 diabetes mellitus with unspecified complications: Secondary | ICD-10-CM | POA: Diagnosis not present

## 2014-08-16 LAB — LIPID PANEL
CHOL/HDL RATIO: 4
Cholesterol: 196 mg/dL (ref 0–200)
HDL: 55.4 mg/dL (ref >39.00)
LDL CALC: 111 mg/dL — AB (ref 0–99)
NONHDL: 140.6
Triglycerides: 147 mg/dL (ref 0.0–149.0)
VLDL: 29.4 mg/dL (ref 0.0–40.0)

## 2014-08-16 LAB — CBC
HCT: 35.4 % — ABNORMAL LOW (ref 36.0–46.0)
Hemoglobin: 11.6 g/dL — ABNORMAL LOW (ref 12.0–15.0)
MCHC: 32.7 g/dL (ref 30.0–36.0)
MCV: 87 fl (ref 78.0–100.0)
PLATELETS: 660 10*3/uL — AB (ref 150.0–400.0)
RBC: 4.07 Mil/uL (ref 3.87–5.11)
RDW: 14.9 % (ref 11.5–15.5)
WBC: 11.9 10*3/uL — ABNORMAL HIGH (ref 4.0–10.5)

## 2014-08-16 LAB — COMPREHENSIVE METABOLIC PANEL
ALT: 20 U/L (ref 0–35)
AST: 18 U/L (ref 0–37)
Albumin: 4.1 g/dL (ref 3.5–5.2)
Alkaline Phosphatase: 66 U/L (ref 39–117)
BILIRUBIN TOTAL: 0.7 mg/dL (ref 0.2–1.2)
BUN: 17 mg/dL (ref 6–23)
CALCIUM: 9.7 mg/dL (ref 8.4–10.5)
CO2: 30 mEq/L (ref 19–32)
Chloride: 101 mEq/L (ref 96–112)
Creatinine, Ser: 1.17 mg/dL (ref 0.40–1.20)
GFR: 59.05 mL/min — AB (ref >60.00)
Glucose, Bld: 101 mg/dL — ABNORMAL HIGH (ref 70–99)
Potassium: 3.7 mEq/L (ref 3.5–5.1)
Sodium: 137 mEq/L (ref 135–145)
TOTAL PROTEIN: 7.3 g/dL (ref 6.0–8.3)

## 2014-08-16 LAB — HEMOGLOBIN A1C: HEMOGLOBIN A1C: 8.5 % — AB (ref 4.6–6.5)

## 2014-08-16 LAB — TSH: TSH: 1.72 u[IU]/mL (ref 0.35–4.50)

## 2014-08-16 NOTE — Patient Instructions (Addendum)
Check with insurance to be sure they pay for labs prior to visit Probiotics such as Digestive Advantage or Phillip's Colon Health daily   Basic Carbohydrate Counting for Diabetes Mellitus Carbohydrate counting is a method for keeping track of the amount of carbohydrates you eat. Eating carbohydrates naturally increases the level of sugar (glucose) in your blood, so it is important for you to know the amount that is okay for you to have in every meal. Carbohydrate counting helps keep the level of glucose in your blood within normal limits. The amount of carbohydrates allowed is different for every person. A dietitian can help you calculate the amount that is right for you. Once you know the amount of carbohydrates you can have, you can count the carbohydrates in the foods you want to eat. Carbohydrates are found in the following foods:  Grains, such as breads and cereals.  Dried beans and soy products.  Starchy vegetables, such as potatoes, peas, and corn.  Fruit and fruit juices.  Milk and yogurt.  Sweets and snack foods, such as cake, cookies, candy, chips, soft drinks, and fruit drinks. CARBOHYDRATE COUNTING There are two ways to count the carbohydrates in your food. You can use either of the methods or a combination of both. Reading the "Nutrition Facts" on [place] The "Nutrition Facts" is an area that is included on the labels of almost all packaged food and beverages in the [place]. It includes the serving size of that food or beverage and information about the nutrients in each serving of the food, including the grams (g) of carbohydrate per serving.  Decide the number of servings of this food or beverage that you will be able to eat or drink. Multiply that number of servings by the number of grams of carbohydrate that is listed on the label for that serving. The total will be the amount of carbohydrates you will be having when you eat or drink this food or  beverage. Learning Standard Serving Sizes of Food When you eat food that is not packaged or does not include "Nutrition Facts" on the label, you need to measure the servings in order to count the amount of carbohydrates.A serving of most carbohydrate-rich foods contains about 15 g of carbohydrates. The following list includes serving sizes of carbohydrate-rich foods that provide 15 g ofcarbohydrate per serving:   1 slice of bread (1 oz) or 1 six-inch tortilla.    of a hamburger bun or English muffin.  4-6 crackers.   cup unsweetened dry cereal.    cup hot cereal.   cup rice or pasta.    cup mashed potatoes or  of a large baked potato.  1 cup fresh fruit or one small piece of fruit.    cup canned or frozen fruit or fruit juice.  1 cup milk.   cup plain fat-free yogurt or yogurt sweetened with artificial sweeteners.   cup cooked dried beans or starchy vegetable, such as peas, corn, or potatoes.  Decide the number of standard-size servings that you will eat. Multiply that number of servings by 15 (the grams of carbohydrates in that serving). For example, if you eat 2 cups of strawberries, you will have eaten 2 servings and 30 g of carbohydrates (2 servings x 15 g = 30 g). For foods such as soups and casseroles, in which more than one food is mixed in, you will need to count the carbohydrates in each food that is included. EXAMPLE OF CARBOHYDRATE COUNTING Sample Dinner  3 oz chicken breast.   cup of brown rice.   cup of corn.  1 cup milk.   1 cup strawberries with sugar-free whipped topping.  Carbohydrate Calculation Step 1: Identify the foods that contain carbohydrates:   Rice.   Corn.   Milk.   Strawberries. Step 2:Calculate the number of servings eaten of each:   2 servings of rice.   1 serving of corn.   1 serving of milk.   1 serving of strawberries. Step 3: Multiply each of those number of servings by 15 g:   2 servings of  rice x 15 g = 30 g.   1 serving of corn x 15 g = 15 g.   1 serving of milk x 15 g = 15 g.   1 serving of strawberries x 15 g = 15 g. Step 4: Add together all of the amounts to find the total grams of carbohydrates eaten: 30 g + 15 g + 15 g + 15 g = 75 g. Document Released: 07/15/2005 Document Revised: 11/29/2013 Document Reviewed: 06/11/2013 [facility] Patient Information 2015 [place], [place]. This information is not intended to replace advice given to you by your health care provider. Make sure you discuss any questions you have with your health care provider.

## 2014-08-16 NOTE — Progress Notes (Signed)
Pre visit review using our clinic review tool, if applicable. No additional management support is needed unless otherwise documented below in the visit note. 

## 2014-08-21 ENCOUNTER — Encounter: Payer: Self-pay | Admitting: Family Medicine

## 2014-08-21 NOTE — Assessment & Plan Note (Addendum)
Improved on recheck, no changes. Minimize sodium and caffeine reports numbers 120 to 130 at home over 80s

## 2014-08-21 NOTE — Assessment & Plan Note (Addendum)
hgba1c unacceptable, minimize simple carbs. Increase exercise as tolerated. Continue current meds. Encouraged to increase insulin by 2 units every 3 days if all numbers above 100. Unfortunately she has suffered a low of 60 and a hi over 300. Encouraged to eat small, frequent meals with preoteins

## 2014-08-21 NOTE — Progress Notes (Signed)
Patient ID: Beth Maldonado, female   DOB: 1945/12/18, 69 y.o.   MRN: [id number]   Beth Maldonado  [id number] [birth year] 08/21/2014      Progress Note-Follow Up  Subjective  Chief Complaint  Chief Complaint  Patient presents with  . Follow-up    HPI  Patient is a 69 y.o. female in today for routine medical care. Patient reports bp at home 120 to 130s over 80s. BS range from 60 to low 300s but usually in mid 200s.  Denies polyuria and polydipsia. Denies CP/palp/SOB/HA/congestion/fevers/GI or GU c/o. Taking meds as prescribed  Past Medical History  Diagnosis Date  . Osteopenia   . Diabetes mellitus   . Hypertension   . Acute bronchitis 03/29/2013  . Depression 06/27/2013  . Allergy   . Arthritis   . Depression with anxiety 06/27/2013  . Medicare annual wellness visit, subsequent 03/23/2014  . Neck pain on left side 05/29/2014    Past Surgical History  Procedure Laterality Date  . Cataract extraction, bilateral    . Laparoscopy      rt knee  . Breast surgery      Reduction  . Vaginal hysterectomy      partial  . Tubal ligation    . Hemorrhoid surgery      Family History  Problem Relation Age of Onset  . Diabetes Mother   . Heart attack Father   . Heart disease Father     20 and 40 MI  . Diabetes Sister   . Sickle cell anemia Sister   . Arthritis Sister   . Hypertension Sister   . Diabetes Sister   . Thyroid disease Sister   . Arthritis Sister   . Glaucoma Sister     History   Social History  . Marital Status: Married    Spouse Name: N/A    Number of Children: 1  . Years of Education: N/A   Occupational History  . retired    Social History Main Topics  . Smoking status: Former Smoker -- 1.00 packs/day for 20 years    Types: Cigarettes    Start date: 10/03/1966    Quit date: 11/03/1986  . Smokeless tobacco: Never Used     Comment: quit 28 years ago  . Alcohol Use: Yes     Comment: Rare  . Drug Use: No  . Sexual Activity: No     Comment: lives  with husband, no dietary restrictions   Other Topics Concern  . Not on file   Social History Narrative    Current Outpatient Prescriptions on File Prior to Visit  Medication Sig Dispense Refill  . aspirin 81 MG tablet Take 81 mg by mouth daily.      Marland Kitchen escitalopram (LEXAPRO) 20 MG tablet Take 1 tablet by mouth  daily 30 tablet 3  . gabapentin (NEURONTIN) 300 MG capsule TAKE 2 CAPSULES BY MOUTH 3 TIMES A DAY 180 capsule 3  . hydrochlorothiazide (HYDRODIURIL) 25 MG tablet TAKE 1 TABLET BY MOUTH ONCE A DAY 30 tablet 3  . hydrOXYzine (ATARAX/VISTARIL) 25 MG tablet TAKE 1 TABLET BY MOUTH TWICE A DAY 60 tablet 3  . Insulin Lispro Prot & Lispro (HUMALOG 75/25 MIX) (75-25) 100 UNIT/ML Kwikpen Take 14 units in the morning and 22 units in the evening. 15 mL 2  . Insulin Pen Needle 32G X 4 MM MISC Use for insulin injection twice a day 250.00 100 each 2  . Krill Oil CAPS MegaRed caps daily    .  losartan (COZAAR) 100 MG tablet TAKE 1 TABLET BY MOUTH ONCE A DAY 30 tablet 3  . metFORMIN (GLUCOPHAGE) 500 MG tablet TAKE 2 TABLETS BY MOUTH TWICE A DAY WITH MEALS 120 tablet 3  . Multiple Vitamin (MULTIVITAMIN) tablet Take 1 tablet by mouth daily.    Marland Kitchen pyridOXINE (VITAMIN B-6) 100 MG tablet Take 100 mg by mouth daily.      Marland Kitchen triamterene-hydrochlorothiazide (MAXZIDE-25) 37.5-25 MG per tablet Take 1 tablet by mouth daily. 30 tablet 3   No current facility-administered medications on file prior to visit.    Allergies  Allergen Reactions  . Penicillins     Review of Systems  ROS  Objective  BP 142/70 mmHg  Pulse 85  Temp(Src) 97.8 F (36.6 C) (Oral)  Ht 5\' 2"  (1.575 m)  Wt 193 lb 6.4 oz (87.726 kg)  BMI 35.36 kg/m2  SpO2 97%  Physical Exam  Physical Exam  Lab Results  Component Value Date   TSH 1.72 08/16/2014   Lab Results  Component Value Date   WBC 11.9* 08/16/2014   HGB 11.6* 08/16/2014   HCT 35.4* 08/16/2014   MCV 87.0 08/16/2014   PLT 660.0* 08/16/2014   Lab Results    Component Value Date   CREATININE 1.17 08/16/2014   BUN 17 08/16/2014   NA 137 08/16/2014   K 3.7 08/16/2014   CL 101 08/16/2014   CO2 30 08/16/2014   Lab Results  Component Value Date   ALT 20 08/16/2014   AST 18 08/16/2014   ALKPHOS 66 08/16/2014   BILITOT 0.7 08/16/2014   Lab Results  Component Value Date   CHOL 196 08/16/2014   Lab Results  Component Value Date   HDL 55.40 08/16/2014   Lab Results  Component Value Date   LDLCALC 111* 08/16/2014   Lab Results  Component Value Date   TRIG 147.0 08/16/2014   Lab Results  Component Value Date   CHOLHDL 4 08/16/2014     Assessment & Plan  Hypertension Improved on recheck, no changes. Minimize sodium and caffeine reports numbers 120 to 130 at home over 80s   Diabetes mellitus hgba1c unacceptable, minimize simple carbs. Increase exercise as tolerated. Continue current meds. Encouraged to increase insulin by 2 units every 3 days if all numbers above 100. Unfortunately she has suffered a low of 60 and a hi over 300. Encouraged to eat small, frequent meals with preoteins

## 2014-09-15 ENCOUNTER — Ambulatory Visit: Payer: MEDICARE | Admitting: Hematology & Oncology

## 2014-09-15 ENCOUNTER — Other Ambulatory Visit: Payer: MEDICARE | Admitting: Lab

## 2014-09-15 ENCOUNTER — Telehealth: Payer: Self-pay | Admitting: Family Medicine

## 2014-09-15 DIAGNOSIS — E1169 Type 2 diabetes mellitus with other specified complication: Secondary | ICD-10-CM

## 2014-09-15 MED ORDER — INSULIN LISPRO PROT & LISPRO (75-25 MIX) 100 UNIT/ML KWIKPEN: PEN_INJECTOR | SUBCUTANEOUS | Status: DC

## 2014-09-15 NOTE — Telephone Encounter (Signed)
Caller name: aarvi Relation to pt: self Call back number: [phone] Pharmacy: [facility] [address]  Reason for call:   humolog 75/25. Needs to be written for a 30 day supply. quikpen

## 2014-09-15 NOTE — Telephone Encounter (Signed)
Patient informed refill done as requested.

## 2014-09-19 ENCOUNTER — Other Ambulatory Visit: Payer: Self-pay | Admitting: Family Medicine

## 2014-09-19 ENCOUNTER — Telehealth: Payer: Self-pay | Admitting: Family Medicine

## 2014-09-19 DIAGNOSIS — E1169 Type 2 diabetes mellitus with other specified complication: Secondary | ICD-10-CM

## 2014-09-19 DIAGNOSIS — E785 Hyperlipidemia, unspecified: Secondary | ICD-10-CM

## 2014-09-19 MED ORDER — INSULIN LISPRO PROT & LISPRO (75-25 MIX) 100 UNIT/ML KWIKPEN
PEN_INJECTOR | SUBCUTANEOUS | Status: DC
Start: 2014-09-19 — End: 2014-09-19

## 2014-09-19 NOTE — Telephone Encounter (Signed)
The prescription is covering the patient for 41 days and needs to be only for 30 days due to cost.  1 box contains 5, 3 ml pens and they cannot break open a box.  The pharmacist states her instructions may need to change in order that she get only a one month supply. Advise please

## 2014-09-19 NOTE — Telephone Encounter (Signed)
Patient is returning your call.  

## 2014-09-19 NOTE — Telephone Encounter (Signed)
Resent insulin into pharmacy per mychart refill request.

## 2014-09-19 NOTE — Telephone Encounter (Signed)
Caller name:Emmauel Relationship to patient:Pharmacy Can be [phone] Pharmacy:[facility] [address]  Reason for call:Needs new rx for Insulin Lispro Prot & Lispro (HUMALOG 75/25 MIX) (75-25) 100 UNIT/ML Claiborne Rigg [[id number]], stating use as need for 1 month supply or 30days. Insurance is charging her for a 11mths supply. Please contact pharmacy.

## 2014-09-19 NOTE — Telephone Encounter (Signed)
I am not sure how to respond to this request I cannot change her sig unless she is using the insulin a different way. Her sig has to reflect what she is actually taking or she would get the wrong med if she ever ended up in the hospital. I am wiling to listen if the pharmacy has a suggestion on how I change the sig in such a way it reflects what she is actually doing and gets her the correct amount.

## 2014-09-19 NOTE — Telephone Encounter (Signed)
Spoke to the pharmacist he stated this is a new insurance for this patient and to make the current prescription work financially she would have to increase what she takes. He suggested to contact  [employer], the manufacturer to see if the  Patient is eligible for patient assistance.

## 2014-09-21 NOTE — Telephone Encounter (Signed)
Patient is struggling with cost of her Humalog, can we please get her in contact with the drug [place] to see if she qualifies for patient assistance? THX

## 2014-09-23 MED ORDER — INSULIN LISPRO PROT & LISPRO (75-25 MIX) 100 UNIT/ML KWIKPEN: PEN_INJECTOR | SUBCUTANEOUS | Status: DC

## 2014-09-23 NOTE — Telephone Encounter (Signed)
Printed a Engineer, water.  Have completed with PCP office demographics, printed a prescription as application requires. Put on counter for PCP to sign. The patient has been contacted regarding this information and will pickup application at her [place] on Tuesday 09/27/14. Patient has been informed she will complete the financial instructions on the application and is responsible for mailing to [employer].  Patient did just received her insulin and at this time is covered for her insulin.

## 2014-09-27 ENCOUNTER — Other Ambulatory Visit: Payer: Self-pay | Admitting: Family Medicine

## 2014-09-27 ENCOUNTER — Other Ambulatory Visit (INDEPENDENT_AMBULATORY_CARE_PROVIDER_SITE_OTHER): Payer: MEDICARE

## 2014-09-27 DIAGNOSIS — E119 Type 2 diabetes mellitus without complications: Secondary | ICD-10-CM

## 2014-09-27 DIAGNOSIS — E669 Obesity, unspecified: Secondary | ICD-10-CM

## 2014-09-27 DIAGNOSIS — I10 Essential (primary) hypertension: Secondary | ICD-10-CM

## 2014-09-27 DIAGNOSIS — E1169 Type 2 diabetes mellitus with other specified complication: Secondary | ICD-10-CM

## 2014-09-27 DIAGNOSIS — E785 Hyperlipidemia, unspecified: Secondary | ICD-10-CM | POA: Diagnosis not present

## 2014-09-27 LAB — CBC
HEMATOCRIT: 36.9 % (ref 36.0–46.0)
Hemoglobin: 12.1 g/dL (ref 12.0–15.0)
MCHC: 32.9 g/dL (ref 30.0–36.0)
MCV: 86.3 fl (ref 78.0–100.0)
Platelets: 626 10*3/uL — ABNORMAL HIGH (ref 150.0–400.0)
RBC: 4.27 Mil/uL (ref 3.87–5.11)
RDW: 14.3 % (ref 11.5–15.5)
WBC: 10.1 10*3/uL (ref 4.0–10.5)

## 2014-09-27 LAB — LIPID PANEL
CHOLESTEROL: 174 mg/dL (ref 0–200)
HDL: 49.5 mg/dL (ref >39.00)
LDL CALC: 97 mg/dL (ref 0–99)
NonHDL: 124.5
Total CHOL/HDL Ratio: 4
Triglycerides: 136 mg/dL (ref 0.0–149.0)
VLDL: 27.2 mg/dL (ref 0.0–40.0)

## 2014-09-27 LAB — COMPREHENSIVE METABOLIC PANEL
ALT: 17 U/L (ref 0–35)
AST: 17 U/L (ref 0–37)
Albumin: 4.4 g/dL (ref 3.5–5.2)
Alkaline Phosphatase: 63 U/L (ref 39–117)
BILIRUBIN TOTAL: 0.6 mg/dL (ref 0.2–1.2)
BUN: 11 mg/dL (ref 6–23)
CHLORIDE: 102 meq/L (ref 96–112)
CO2: 30 mEq/L (ref 19–32)
CREATININE: 1.15 mg/dL (ref 0.40–1.20)
Calcium: 9.6 mg/dL (ref 8.4–10.5)
GFR: 60.22 mL/min (ref >60.00)
Glucose, Bld: 111 mg/dL — ABNORMAL HIGH (ref 70–99)
Potassium: 3.7 mEq/L (ref 3.5–5.1)
Sodium: 140 mEq/L (ref 135–145)
TOTAL PROTEIN: 7.7 g/dL (ref 6.0–8.3)

## 2014-09-27 LAB — HEMOGLOBIN A1C: HEMOGLOBIN A1C: 8.3 % — AB (ref 4.6–6.5)

## 2014-09-27 LAB — TSH: TSH: 2.11 u[IU]/mL (ref 0.35–4.50)

## 2014-09-30 ENCOUNTER — Other Ambulatory Visit: Payer: Self-pay | Admitting: Family Medicine

## 2014-10-31 ENCOUNTER — Other Ambulatory Visit: Payer: Self-pay | Admitting: Family Medicine

## 2014-11-14 ENCOUNTER — Other Ambulatory Visit: Payer: Self-pay | Admitting: Family Medicine

## 2014-11-15 ENCOUNTER — Other Ambulatory Visit (INDEPENDENT_AMBULATORY_CARE_PROVIDER_SITE_OTHER): Payer: MEDICARE

## 2014-11-15 DIAGNOSIS — R7989 Other specified abnormal findings of blood chemistry: Secondary | ICD-10-CM

## 2014-11-16 LAB — CBC WITH DIFFERENTIAL/PLATELET
BASOS PCT: 0.3 % (ref 0.0–3.0)
Basophils Absolute: 0 10*3/uL (ref 0.0–0.1)
EOS ABS: 0.8 10*3/uL — AB (ref 0.0–0.7)
EOS PCT: 6.7 % — AB (ref 0.0–5.0)
HCT: 34.3 % — ABNORMAL LOW (ref 36.0–46.0)
Hemoglobin: 11.3 g/dL — ABNORMAL LOW (ref 12.0–15.0)
Lymphocytes Relative: 25.7 % (ref 12.0–46.0)
Lymphs Abs: 2.9 10*3/uL (ref 0.7–4.0)
MCHC: 32.8 g/dL (ref 30.0–36.0)
MCV: 86.8 fl (ref 78.0–100.0)
MONOS PCT: 6.3 % (ref 3.0–12.0)
Monocytes Absolute: 0.7 10*3/uL (ref 0.1–1.0)
NEUTROS PCT: 61 % (ref 43.0–77.0)
Neutro Abs: 6.8 10*3/uL (ref 1.4–7.7)
Platelets: 364 10*3/uL (ref 150.0–400.0)
RBC: 3.95 Mil/uL (ref 3.87–5.11)
RDW: 15.3 % (ref 11.5–15.5)
WBC: 11.2 10*3/uL — AB (ref 4.0–10.5)

## 2014-11-18 ENCOUNTER — Ambulatory Visit (INDEPENDENT_AMBULATORY_CARE_PROVIDER_SITE_OTHER): Payer: MEDICARE | Admitting: Family Medicine

## 2014-11-18 ENCOUNTER — Encounter: Payer: Self-pay | Admitting: Family Medicine

## 2014-11-18 VITALS — BP 164/80 | HR 87 | Temp 98.8°F | Resp 18 | Ht 62.0 in | Wt 187.0 lb

## 2014-11-18 DIAGNOSIS — J069 Acute upper respiratory infection, unspecified: Secondary | ICD-10-CM

## 2014-11-18 DIAGNOSIS — E782 Mixed hyperlipidemia: Secondary | ICD-10-CM | POA: Diagnosis not present

## 2014-11-18 DIAGNOSIS — E785 Hyperlipidemia, unspecified: Secondary | ICD-10-CM

## 2014-11-18 DIAGNOSIS — J440 Chronic obstructive pulmonary disease with acute lower respiratory infection: Secondary | ICD-10-CM

## 2014-11-18 DIAGNOSIS — I10 Essential (primary) hypertension: Secondary | ICD-10-CM

## 2014-11-18 DIAGNOSIS — E669 Obesity, unspecified: Secondary | ICD-10-CM

## 2014-11-18 DIAGNOSIS — E1169 Type 2 diabetes mellitus with other specified complication: Secondary | ICD-10-CM

## 2014-11-18 DIAGNOSIS — Z0000 Encounter for general adult medical examination without abnormal findings: Secondary | ICD-10-CM

## 2014-11-18 DIAGNOSIS — F418 Other specified anxiety disorders: Secondary | ICD-10-CM | POA: Diagnosis not present

## 2014-11-18 MED ORDER — ZOSTER VACCINE LIVE 19400 UNT/0.65ML ~~LOC~~ SOLR: 0.6500 mL | Freq: Once | SUBCUTANEOUS | Status: DC

## 2014-11-18 MED ORDER — ESCITALOPRAM OXALATE 20 MG PO TABS: ORAL_TABLET | ORAL | Status: DC

## 2014-11-18 MED ORDER — DOXYCYCLINE HYCLATE 100 MG PO TABS: 100.0000 mg | ORAL_TABLET | Freq: Two times a day (BID) | ORAL | Status: DC

## 2014-11-18 MED ORDER — INSULIN LISPRO PROT & LISPRO (75-25 MIX) 100 UNIT/ML KWIKPEN: PEN_INJECTOR | SUBCUTANEOUS | Status: DC

## 2014-11-18 NOTE — Patient Instructions (Addendum)
Routine follow up in 3 months with labs prior as ordered AWV in 6 months   Basic Carbohydrate Counting for Diabetes Mellitus Carbohydrate counting is a method for keeping track of the amount of carbohydrates you eat. Eating carbohydrates naturally increases the level of sugar (glucose) in your blood, so it is important for you to know the amount that is okay for you to have in every meal. Carbohydrate counting helps keep the level of glucose in your blood within normal limits. The amount of carbohydrates allowed is different for every person. A dietitian can help you calculate the amount that is right for you. Once you know the amount of carbohydrates you can have, you can count the carbohydrates in the foods you want to eat. Carbohydrates are found in the following foods:  Grains, such as breads and cereals.  Dried beans and soy products.  Starchy vegetables, such as potatoes, peas, and corn.  Fruit and fruit juices.  Milk and yogurt.  Sweets and snack foods, such as cake, cookies, candy, chips, soft drinks, and fruit drinks. CARBOHYDRATE COUNTING There are two ways to count the carbohydrates in your food. You can use either of the methods or a combination of both. Reading the "Nutrition Facts" on [place] The "Nutrition Facts" is an area that is included on the labels of almost all packaged food and beverages in the [place]. It includes the serving size of that food or beverage and information about the nutrients in each serving of the food, including the grams (g) of carbohydrate per serving.  Decide the number of servings of this food or beverage that you will be able to eat or drink. Multiply that number of servings by the number of grams of carbohydrate that is listed on the label for that serving. The total will be the amount of carbohydrates you will be having when you eat or drink this food or beverage. Learning Standard Serving Sizes of Food When you eat food that is not  packaged or does not include "Nutrition Facts" on the label, you need to measure the servings in order to count the amount of carbohydrates.A serving of most carbohydrate-rich foods contains about 15 g of carbohydrates. The following list includes serving sizes of carbohydrate-rich foods that provide 15 g ofcarbohydrate per serving:   1 slice of bread (1 oz) or 1 six-inch tortilla.    of a hamburger bun or English muffin.  4-6 crackers.   cup unsweetened dry cereal.    cup hot cereal.   cup rice or pasta.    cup mashed potatoes or  of a large baked potato.  1 cup fresh fruit or one small piece of fruit.    cup canned or frozen fruit or fruit juice.  1 cup milk.   cup plain fat-free yogurt or yogurt sweetened with artificial sweeteners.   cup cooked dried beans or starchy vegetable, such as peas, corn, or potatoes.  Decide the number of standard-size servings that you will eat. Multiply that number of servings by 15 (the grams of carbohydrates in that serving). For example, if you eat 2 cups of strawberries, you will have eaten 2 servings and 30 g of carbohydrates (2 servings x 15 g = 30 g). For foods such as soups and casseroles, in which more than one food is mixed in, you will need to count the carbohydrates in each food that is included. EXAMPLE OF CARBOHYDRATE COUNTING Sample Dinner  3 oz chicken breast.   cup of  brown rice.   cup of corn.  1 cup milk.   1 cup strawberries with sugar-free whipped topping.  Carbohydrate Calculation Step 1: Identify the foods that contain carbohydrates:   Rice.   Corn.   Milk.   Strawberries. Step 2:Calculate the number of servings eaten of each:   2 servings of rice.   1 serving of corn.   1 serving of milk.   1 serving of strawberries. Step 3: Multiply each of those number of servings by 15 g:   2 servings of rice x 15 g = 30 g.   1 serving of corn x 15 g = 15 g.   1 serving of milk x 15  g = 15 g.   1 serving of strawberries x 15 g = 15 g. Step 4: Add together all of the amounts to find the total grams of carbohydrates eaten: 30 g + 15 g + 15 g + 15 g = 75 g. Document Released: 07/15/2005 Document Revised: 11/29/2013 Document Reviewed: 06/11/2013 [facility] Patient Information 2015 [place], [place]. This information is not intended to replace advice given to you by your health care provider. Make sure you discuss any questions you have with your health care provider.

## 2014-11-18 NOTE — Assessment & Plan Note (Signed)
Was in 100s but has spiked to 200s fasting and some numbers in the 60s at night about 3 weeks ago. Requests a 90 day supply to [facility]

## 2014-11-18 NOTE — Progress Notes (Signed)
Pre visit review using our clinic review tool, if applicable. No additional management support is needed unless otherwise documented below in the visit note. 

## 2014-11-27 ENCOUNTER — Encounter: Payer: Self-pay | Admitting: Family Medicine

## 2014-11-27 DIAGNOSIS — J069 Acute upper respiratory infection, unspecified: Secondary | ICD-10-CM

## 2014-11-27 DIAGNOSIS — E669 Obesity, unspecified: Secondary | ICD-10-CM | POA: Insufficient documentation

## 2014-11-27 HISTORY — DX: Acute upper respiratory infection, unspecified: J06.9

## 2014-11-27 NOTE — Assessment & Plan Note (Signed)
Encouraged increased rest and hydration, add probiotics, zinc such as Coldeze or Xicam. Treat fevers as needed. If symptoms worsen is given a prescription for Doxycycline to use

## 2014-11-27 NOTE — Assessment & Plan Note (Signed)
Poorly controlled will alter medications, encouraged DASH diet, minimize caffeine and obtain adequate sleep. Report concerning symptoms and follow up as directed and as needed. Will switch from HCTZ to Maxzide.

## 2014-11-27 NOTE — Assessment & Plan Note (Signed)
Encouraged heart healthy diet, increase exercise, avoid trans fats, consider a krill oil cap daily 

## 2014-11-27 NOTE — Assessment & Plan Note (Signed)
Encouraged DASH diet, decrease po intake and increase exercise as tolerated. Needs 7-8 hours of sleep nightly. Avoid trans fats, eat small, frequent meals every 4-5 hours with lean proteins, complex carbs and healthy fats. Minimize simple carbs, GMO foods. 

## 2014-11-27 NOTE — Progress Notes (Signed)
Beth Maldonado  [id number] 07-17-1946 11/27/2014      Progress Note-Follow Up  Subjective  Chief Complaint  Chief Complaint  Patient presents with  . Follow-up    3 mo    HPI  Patient is a 69 y.o. female in today for routine medical care. Patient is in today complaining of some congestion. Has had congestion and some mild chest congestion. No fevers or chills. Cough is present but does not keep her up at night. Is generally nonproductive. No fevers or chills. No other acute complaints. Denies polyuria or polydipsia. Denies CP/palp/SOB/HA/fevers/GI or GU c/o. Taking meds as prescribed  Past Medical History  Diagnosis Date  . Osteopenia   . Diabetes mellitus   . Hypertension   . Acute bronchitis 03/29/2013  . Depression 06/27/2013  . Allergy   . Arthritis   . Depression with anxiety 06/27/2013  . Medicare annual wellness visit, subsequent 03/23/2014  . Neck pain on left side 05/29/2014  . Acute upper respiratory infection 11/27/2014    Past Surgical History  Procedure Laterality Date  . Cataract extraction, bilateral    . Laparoscopy      rt knee  . Breast surgery      Reduction  . Vaginal hysterectomy      partial  . Tubal ligation    . Hemorrhoid surgery      Family History  Problem Relation Age of Onset  . Diabetes Mother   . Heart attack Father   . Heart disease Father     18 and 6 MI  . Diabetes Sister   . Sickle cell anemia Sister   . Arthritis Sister   . Hypertension Sister   . Diabetes Sister   . Thyroid disease Sister   . Arthritis Sister   . Glaucoma Sister     History   Social History  . Marital Status: Married    Spouse Name: N/A  . Number of Children: 1  . Years of Education: N/A   Occupational History  . retired    Social History Main Topics  . Smoking status: Former Smoker -- 1.00 packs/day for 20 years    Types: Cigarettes    Start date: 10/03/1966    Quit date: 11/03/1986  . Smokeless tobacco: Never Used     Comment: quit 28  years ago  . Alcohol Use: Yes     Comment: Rare  . Drug Use: No  . Sexual Activity: No     Comment: lives with husband, no dietary restrictions   Other Topics Concern  . Not on file   Social History Narrative    Current Outpatient Prescriptions on File Prior to Visit  Medication Sig Dispense Refill  . aspirin 81 MG tablet Take 81 mg by mouth daily.      Marland Kitchen gabapentin (NEURONTIN) 300 MG capsule TAKE 2 CAPSULES BY MOUTH 3 TIMES A DAY 180 capsule 3  . hydrOXYzine (ATARAX/VISTARIL) 25 MG tablet TAKE 1 TABLET BY MOUTH TWICE A DAY 60 tablet 3  . Insulin Pen Needle 32G X 4 MM MISC Use for insulin injection twice a day 250.00 100 each 2  . Krill Oil CAPS MegaRed caps daily    . losartan (COZAAR) 100 MG tablet TAKE 1 TABLET BY MOUTH ONCE A DAY 30 tablet 3  . metFORMIN (GLUCOPHAGE) 500 MG tablet Take 2 tablets (1,000 mg total) by mouth 2 (two) times daily with a meal. 120 tablet 3  . Multiple Vitamin (MULTIVITAMIN) tablet Take 1 tablet  by mouth daily.     No current facility-administered medications on file prior to visit.    Allergies  Allergen Reactions  . Penicillins Swelling    Review of Systems  Review of Systems  Constitutional: Positive for malaise/fatigue. Negative for fever.  HENT: Positive for congestion.   Eyes: Negative for discharge.  Respiratory: Positive for cough. Negative for shortness of breath.   Cardiovascular: Negative for chest pain, palpitations and leg swelling.  Gastrointestinal: Negative for nausea, abdominal pain and diarrhea.  Genitourinary: Negative for dysuria.  Musculoskeletal: Negative for falls.  Skin: Negative for rash.  Neurological: Negative for loss of consciousness and headaches.  Endo/Heme/Allergies: Negative for polydipsia.  Psychiatric/Behavioral: Negative for depression and suicidal ideas. The patient is not nervous/anxious and does not have insomnia.     Objective  BP 164/80 mmHg  Pulse 87  Temp(Src) 98.8 F (37.1 C) (Oral)  Resp  18  Ht 5\' 2"  (1.575 m)  Wt 187 lb (84.823 kg)  BMI 34.19 kg/m2  SpO2 98%  Physical Exam  Physical Exam  Constitutional: She is oriented to person, place, and time and well-developed, well-nourished, and in no distress. No distress.  HENT:  Head: Normocephalic and atraumatic.  Eyes: Conjunctivae are normal.  Neck: Neck supple. No thyromegaly present.  Cardiovascular: Normal rate, regular rhythm and normal heart sounds.   No murmur heard. Pulmonary/Chest: Effort normal and breath sounds normal. She has no wheezes.  Abdominal: She exhibits no distension and no mass.  Musculoskeletal: She exhibits no edema.  Lymphadenopathy:    She has no cervical adenopathy.  Neurological: She is alert and oriented to person, place, and time.  Skin: Skin is warm and dry. No rash noted. She is not diaphoretic.  Psychiatric: Memory, affect and judgment normal.    Lab Results  Component Value Date   TSH 2.11 09/27/2014   Lab Results  Component Value Date   WBC 11.2* 11/15/2014   HGB 11.3* 11/15/2014   HCT 34.3* 11/15/2014   MCV 86.8 11/15/2014   PLT 364.0 11/15/2014   Lab Results  Component Value Date   CREATININE 1.15 09/27/2014   BUN 11 09/27/2014   NA 140 09/27/2014   K 3.7 09/27/2014   CL 102 09/27/2014   CO2 30 09/27/2014   Lab Results  Component Value Date   ALT 17 09/27/2014   AST 17 09/27/2014   ALKPHOS 63 09/27/2014   BILITOT 0.6 09/27/2014   Lab Results  Component Value Date   CHOL 174 09/27/2014   Lab Results  Component Value Date   HDL 49.50 09/27/2014   Lab Results  Component Value Date   LDLCALC 97 09/27/2014   Lab Results  Component Value Date   TRIG 136.0 09/27/2014   Lab Results  Component Value Date   CHOLHDL 4 09/27/2014     Assessment & Plan  Diabetes mellitus Was in 100s but has spiked to 200s fasting and some numbers in the 60s at night about 3 weeks ago. Requests a 90 day supply to [facility]   Hypertension Poorly controlled will alter  medications, encouraged DASH diet, minimize caffeine and obtain adequate sleep. Report concerning symptoms and follow up as directed and as needed. Will switch from HCTZ to Maxzide.   Hyperlipidemia Encouraged heart healthy diet, increase exercise, avoid trans fats, consider a krill oil cap daily   Obesity Encouraged DASH diet, decrease po intake and increase exercise as tolerated. Needs 7-8 hours of sleep nightly. Avoid trans fats, eat small, frequent meals  every 4-5 hours with lean proteins, complex carbs and healthy fats. Minimize simple carbs, GMO foods.   Acute upper respiratory infection Encouraged increased rest and hydration, add probiotics, zinc such as Coldeze or Xicam. Treat fevers as needed. If symptoms worsen is given a prescription for Doxycycline to use

## 2014-12-05 ENCOUNTER — Other Ambulatory Visit: Payer: Self-pay | Admitting: Family Medicine

## 2014-12-20 ENCOUNTER — Other Ambulatory Visit: Payer: Self-pay | Admitting: Family Medicine

## 2014-12-20 NOTE — Telephone Encounter (Signed)
Last refill on 08/02/14  #180 with 3 refills Last office visit 11/18/14 Next scheduled office visit 02/17/15

## 2015-02-10 ENCOUNTER — Other Ambulatory Visit: Payer: MEDICARE

## 2015-02-10 DIAGNOSIS — Z1231 Encounter for screening mammogram for malignant neoplasm of breast: Secondary | ICD-10-CM | POA: Diagnosis not present

## 2015-02-10 LAB — HM MAMMOGRAPHY: HM Mammogram: NORMAL

## 2015-02-17 ENCOUNTER — Other Ambulatory Visit (INDEPENDENT_AMBULATORY_CARE_PROVIDER_SITE_OTHER): Payer: MEDICARE

## 2015-02-17 ENCOUNTER — Ambulatory Visit: Payer: MEDICARE | Admitting: Family Medicine

## 2015-02-17 DIAGNOSIS — E782 Mixed hyperlipidemia: Secondary | ICD-10-CM

## 2015-02-17 DIAGNOSIS — E1169 Type 2 diabetes mellitus with other specified complication: Secondary | ICD-10-CM | POA: Diagnosis not present

## 2015-02-17 DIAGNOSIS — Z0000 Encounter for general adult medical examination without abnormal findings: Secondary | ICD-10-CM

## 2015-02-17 LAB — CBC
HEMATOCRIT: 34.9 % — AB (ref 36.0–46.0)
Hemoglobin: 11.4 g/dL — ABNORMAL LOW (ref 12.0–15.0)
MCHC: 32.6 g/dL (ref 30.0–36.0)
MCV: 86.4 fl (ref 78.0–100.0)
PLATELETS: 662 10*3/uL — AB (ref 150.0–400.0)
RBC: 4.04 Mil/uL (ref 3.87–5.11)
RDW: 14.6 % (ref 11.5–15.5)
WBC: 11.2 10*3/uL — ABNORMAL HIGH (ref 4.0–10.5)

## 2015-02-17 LAB — LIPID PANEL
Cholesterol: 197 mg/dL (ref 0–200)
HDL: 42.8 mg/dL (ref >39.00)
NONHDL: 154.2
TRIGLYCERIDES: 227 mg/dL — AB (ref 0.0–149.0)
Total CHOL/HDL Ratio: 5
VLDL: 45.4 mg/dL — ABNORMAL HIGH (ref 0.0–40.0)

## 2015-02-17 LAB — COMPLETE METABOLIC PANEL WITH GFR
ALT: 12 U/L (ref 0–35)
AST: 14 U/L (ref 0–37)
Albumin: 4.2 g/dL (ref 3.5–5.2)
Alkaline Phosphatase: 64 U/L (ref 39–117)
BILIRUBIN TOTAL: 0.8 mg/dL (ref 0.2–1.2)
BUN: 17 mg/dL (ref 6–23)
CO2: 28 meq/L (ref 19–32)
CREATININE: 1.3 mg/dL — AB (ref 0.50–1.10)
Calcium: 10.1 mg/dL (ref 8.4–10.5)
Chloride: 97 mEq/L (ref 96–112)
GFR, Est African American: 48 mL/min — ABNORMAL LOW
GFR, Est Non African American: 42 mL/min — ABNORMAL LOW
Glucose, Bld: 200 mg/dL — ABNORMAL HIGH (ref 70–99)
Potassium: 4.2 mEq/L (ref 3.5–5.3)
Sodium: 140 mEq/L (ref 135–145)
Total Protein: 7.3 g/dL (ref 6.0–8.3)

## 2015-02-17 LAB — LDL CHOLESTEROL, DIRECT: Direct LDL: 97 mg/dL

## 2015-02-17 LAB — TSH: TSH: 3.46 u[IU]/mL (ref 0.35–4.50)

## 2015-02-17 LAB — HEMOGLOBIN A1C: Hgb A1c MFr Bld: 7.9 % — ABNORMAL HIGH (ref 4.6–6.5)

## 2015-02-24 ENCOUNTER — Ambulatory Visit: Payer: MEDICARE | Admitting: Family Medicine

## 2015-02-27 ENCOUNTER — Encounter: Payer: Self-pay | Admitting: Family Medicine

## 2015-03-01 ENCOUNTER — Other Ambulatory Visit: Payer: Self-pay | Admitting: Family Medicine

## 2015-03-01 NOTE — Telephone Encounter (Signed)
Last OV- 4/22 7/22 A1C-7.9  Next appt 9/2  Please advise

## 2015-03-23 ENCOUNTER — Other Ambulatory Visit: Payer: Self-pay | Admitting: Family Medicine

## 2015-03-31 ENCOUNTER — Encounter: Payer: Self-pay | Admitting: Family Medicine

## 2015-03-31 ENCOUNTER — Ambulatory Visit (INDEPENDENT_AMBULATORY_CARE_PROVIDER_SITE_OTHER): Payer: MEDICARE | Admitting: Family Medicine

## 2015-03-31 VITALS — BP 130/72 | HR 88 | Temp 98.1°F | Ht 61.5 in | Wt 187.2 lb

## 2015-03-31 DIAGNOSIS — Z0000 Encounter for general adult medical examination without abnormal findings: Secondary | ICD-10-CM | POA: Diagnosis not present

## 2015-03-31 DIAGNOSIS — E785 Hyperlipidemia, unspecified: Secondary | ICD-10-CM

## 2015-03-31 DIAGNOSIS — E669 Obesity, unspecified: Secondary | ICD-10-CM

## 2015-03-31 DIAGNOSIS — I10 Essential (primary) hypertension: Secondary | ICD-10-CM | POA: Diagnosis not present

## 2015-03-31 DIAGNOSIS — M8580 Other specified disorders of bone density and structure, unspecified site: Secondary | ICD-10-CM

## 2015-03-31 DIAGNOSIS — E118 Type 2 diabetes mellitus with unspecified complications: Secondary | ICD-10-CM

## 2015-03-31 MED ORDER — ZOSTER VACCINE LIVE 19400 UNT/0.65ML ~~LOC~~ SOLR: 0.6500 mL | Freq: Once | SUBCUTANEOUS | Status: DC

## 2015-03-31 NOTE — Progress Notes (Signed)
Pre visit review using our clinic review tool, if applicable. No additional management support is needed unless otherwise documented below in the visit note. 

## 2015-03-31 NOTE — Progress Notes (Signed)
Subjective:    Patient ID: Beth Maldonado, female    DOB: April 25, 1946, 69 y.o.   MRN: [id number]  Chief Complaint  Patient presents with  . Follow-up    HPI Patient is in today for follow-up. Feels well today. Has not had any recent illness. Is requesting a reporting of her shingles shot vaccine so she can proceed. She reports she is not exercising or sleeping well. She denies any recent illness. Denies CP/palp/SOB/HA/congestion/fevers/GI or GU c/o. Taking meds as prescribed  Past Medical History  Diagnosis Date  . Osteopenia   . Diabetes mellitus   . Hypertension   . Acute bronchitis 03/29/2013  . Depression 06/27/2013  . Allergy   . Arthritis   . Depression with anxiety 06/27/2013  . Medicare annual wellness visit, subsequent 03/23/2014  . Neck pain on left side 05/29/2014  . Acute upper respiratory infection 11/27/2014    Past Surgical History  Procedure Laterality Date  . Cataract extraction, bilateral    . Laparoscopy      rt knee  . Breast surgery      Reduction  . Vaginal hysterectomy      partial  . Tubal ligation    . Hemorrhoid surgery      Family History  Problem Relation Age of Onset  . Diabetes Mother   . Heart attack Father   . Heart disease Father     55 and 71 MI  . Diabetes Sister   . Sickle cell anemia Sister   . Arthritis Sister   . Hypertension Sister   . Diabetes Sister   . Thyroid disease Sister   . Arthritis Sister   . Glaucoma Sister     Social History   Social History  . Marital Status: Married    Spouse Name: N/A  . Number of Children: 1  . Years of Education: N/A   Occupational History  . retired    Social History Main Topics  . Smoking status: Former Smoker -- 1.00 packs/day for 20 years    Types: Cigarettes    Start date: 10/03/1966    Quit date: 11/03/1986  . Smokeless tobacco: Never Used     Comment: quit 28 years ago  . Alcohol Use: Yes     Comment: Rare  . Drug Use: No  . Sexual Activity: No     Comment: lives  with husband, no dietary restrictions   Other Topics Concern  . Not on file   Social History Narrative    Outpatient Prescriptions Prior to Visit  Medication Sig Dispense Refill  . aspirin 81 MG tablet Take 81 mg by mouth daily.      Marland Kitchen escitalopram (LEXAPRO) 20 MG tablet Take 1 tablet by mouth  daily 30 tablet 3  . gabapentin (NEURONTIN) 300 MG capsule TAKE 2 CAPSULES BY MOUTH 3 TIMES A DAY 180 capsule 3  . hydrochlorothiazide (HYDRODIURIL) 25 MG tablet TAKE 1 TABLET BY MOUTH ONCE A DAY 30 tablet 6  . hydrOXYzine (ATARAX/VISTARIL) 25 MG tablet TAKE 1 TABLET BY MOUTH TWICE A DAY 60 tablet 3  . Insulin Lispro Prot & Lispro (HUMALOG 75/25 MIX) (75-25) 100 UNIT/ML Kwikpen Take 16 units in the morning and 24 units in the evening. 150 mL 1  . Insulin Pen Needle 32G X 4 MM MISC Use for insulin injection twice a day 250.00 100 each 2  . Krill Oil CAPS MegaRed caps daily    . losartan (COZAAR) 100 MG tablet TAKE 1 TABLET BY  MOUTH ONCE A DAY 30 tablet 3  . metFORMIN (GLUCOPHAGE) 500 MG tablet TAKE 2 TABLETS BY MOUTH TWICE DAILY WITH MEALS 120 tablet 3  . Multiple Vitamin (MULTIVITAMIN) tablet Take 1 tablet by mouth daily.    . Multiple Vitamins-Minerals (MULTIVITAMIN PO) Take by mouth.    . triamterene-hydrochlorothiazide (MAXZIDE-25) 37.5-25 MG per tablet     . zoster vaccine live, PF, (ZOSTAVAX) 47096 UNT/0.65ML injection Inject 19,400 Units into the skin once. 1 each 0  . doxycycline (VIBRA-TABS) 100 MG tablet Take 1 tablet (100 mg total) by mouth 2 (two) times daily. 20 tablet 0   No facility-administered medications prior to visit.    Allergies  Allergen Reactions  . Penicillins Swelling    Review of Systems  Constitutional: Negative for fever and malaise/fatigue.  HENT: Negative for congestion.   Eyes: Negative for discharge.  Respiratory: Negative for shortness of breath.   Cardiovascular: Negative for chest pain, palpitations and leg swelling.  Gastrointestinal: Negative for  nausea and abdominal pain.  Genitourinary: Negative for dysuria.  Musculoskeletal: Negative for falls.  Skin: Negative for rash.  Neurological: Negative for loss of consciousness and headaches.  Endo/Heme/Allergies: Negative for environmental allergies.  Psychiatric/Behavioral: Negative for depression. The patient is not nervous/anxious.        Objective:    Physical Exam  Constitutional: She is oriented to person, place, and time. She appears well-developed and well-nourished. No distress.  HENT:  Head: Normocephalic and atraumatic.  Nose: Nose normal.  Eyes: Right eye exhibits no discharge. Left eye exhibits no discharge.  Neck: Normal range of motion. Neck supple.  Cardiovascular: Normal rate and regular rhythm.   No murmur heard. Pulmonary/Chest: Effort normal and breath sounds normal.  Abdominal: Soft. Bowel sounds are normal. There is no tenderness.  Musculoskeletal: She exhibits no edema.  Neurological: She is alert and oriented to person, place, and time.  Skin: Skin is warm and dry.  Psychiatric: She has a normal mood and affect.  Nursing note and vitals reviewed.   BP 142/78 mmHg  Pulse 88  Temp(Src) 98.1 F (36.7 C) (Oral)  Ht 5' 1.5" (1.562 m)  Wt 187 lb 4 oz (84.936 kg)  BMI 34.81 kg/m2  SpO2 95% Wt Readings from Last 3 Encounters:  03/31/15 187 lb 4 oz (84.936 kg)  11/18/14 187 lb (84.823 kg)  08/16/14 193 lb 6.4 oz (87.726 kg)     Lab Results  Component Value Date   WBC 11.2* 02/17/2015   HGB 11.4* 02/17/2015   HCT 34.9* 02/17/2015   PLT 662.0* 02/17/2015   GLUCOSE 200* 02/17/2015   CHOL 197 02/17/2015   TRIG 227.0* 02/17/2015   HDL 42.80 02/17/2015   LDLDIRECT 97.0 02/17/2015   LDLCALC 97 09/27/2014   ALT 12 02/17/2015   AST 14 02/17/2015   NA 140 02/17/2015   K 4.2 02/17/2015   CL 97 02/17/2015   CREATININE 1.30* 02/17/2015   BUN 17 02/17/2015   CO2 28 02/17/2015   TSH 3.46 02/17/2015   HGBA1C 7.9* 02/17/2015   MICROALBUR 0.51  02/20/2011    Lab Results  Component Value Date   TSH 3.46 02/17/2015   Lab Results  Component Value Date   WBC 11.2* 02/17/2015   HGB 11.4* 02/17/2015   HCT 34.9* 02/17/2015   MCV 86.4 02/17/2015   PLT 662.0* 02/17/2015   Lab Results  Component Value Date   NA 140 02/17/2015   K 4.2 02/17/2015   CO2 28 02/17/2015   GLUCOSE 200* 02/17/2015  BUN 17 02/17/2015   CREATININE 1.30* 02/17/2015   BILITOT 0.8 02/17/2015   ALKPHOS 64 02/17/2015   AST 14 02/17/2015   ALT 12 02/17/2015   PROT 7.3 02/17/2015   ALBUMIN 4.2 02/17/2015   CALCIUM 10.1 02/17/2015   GFR 60.22 09/27/2014   Lab Results  Component Value Date   CHOL 197 02/17/2015   Lab Results  Component Value Date   HDL 42.80 02/17/2015   Lab Results  Component Value Date   LDLCALC 97 09/27/2014   Lab Results  Component Value Date   TRIG 227.0* 02/17/2015   Lab Results  Component Value Date   CHOLHDL 5 02/17/2015   Lab Results  Component Value Date   HGBA1C 7.9* 02/17/2015       Assessment & Plan:  Diabetes mellitus hgba1c acceptable, minimize simple carbs. Increase exercise as tolerated. Continue current meds  Obesity Encouraged DASH diet, decrease po intake and increase exercise as tolerated. Needs 7-8 hours of sleep nightly. Avoid trans fats, eat small, frequent meals every 4-5 hours with lean proteins, complex carbs and healthy fats. Minimize simple carbs, GMO foods.  Hyperlipidemia Encouraged heart healthy diet, increase exercise, avoid trans fats, consider a krill oil cap daily  Hypertension Well controlled, no changes to meds. Encouraged heart healthy diet such as the DASH diet and exercise as tolerated.   Osteopenia Encouraged vitamin d supplement daily and calcium tid. Increase exercise    I have discontinued Ms. Hargens's doxycycline. I am also having her maintain her aspirin, Krill Oil, Insulin Pen Needle, multivitamin, losartan, Multiple Vitamins-Minerals (MULTIVITAMIN PO),  escitalopram, Insulin Lispro Prot & Lispro, triamterene-hydrochlorothiazide, zoster vaccine live (PF), hydrOXYzine, gabapentin, metFORMIN, and hydrochlorothiazide.  No orders of the defined types were placed in this encounter.     Penni Homans, MD

## 2015-03-31 NOTE — Patient Instructions (Signed)
Encouraged good sleep hygiene such as dark, quiet room. No blue/green glowing lights such as computer screens in bedroom. No alcohol or stimulants in evening. Cut down on caffeine as able. Regular exercise is helpful but not just prior to bed time.   Melatonin 2 to 10 mg 1/2 hours before bed   Insomnia Insomnia is frequent trouble falling and/or staying asleep. Insomnia can be a long term problem or a short term problem. Both are common. Insomnia can be a short term problem when the wakefulness is related to a certain stress or worry. Long term insomnia is often related to ongoing stress during waking hours and/or poor sleeping habits. Overtime, sleep deprivation itself can make the problem worse. Every little thing feels more severe because you are overtired and your ability to cope is decreased. CAUSES   Stress, anxiety, and depression.  Poor sleeping habits.  Distractions such as TV in the bedroom.  Naps close to bedtime.  Engaging in emotionally charged conversations before bed.  Technical reading before sleep.  Alcohol and other sedatives. They may make the problem worse. They can hurt normal sleep patterns and normal dream activity.  Stimulants such as caffeine for several hours prior to bedtime.  Pain syndromes and shortness of breath can cause insomnia.  Exercise late at night.  Changing time zones may cause sleeping problems (jet lag). It is sometimes helpful to have someone observe your sleeping patterns. They should look for periods of not breathing during the night (sleep apnea). They should also look to see how long those periods last. If you live alone or observers are uncertain, you can also be observed at a sleep clinic where your sleep patterns will be professionally monitored. Sleep apnea requires a checkup and treatment. Give your caregivers your medical history. Give your caregivers observations your family has made about your sleep.  SYMPTOMS   Not feeling rested  in the morning.  Anxiety and restlessness at bedtime.  Difficulty falling and staying asleep. TREATMENT   Your caregiver may prescribe treatment for an underlying medical disorders. Your caregiver can give advice or help if you are using alcohol or other drugs for self-medication. Treatment of underlying problems will usually eliminate insomnia problems.  Medications can be prescribed for short time use. They are generally not recommended for lengthy use.  Over-the-counter sleep medicines are not recommended for lengthy use. They can be habit forming.  You can promote easier sleeping by making lifestyle changes such as:  Using relaxation techniques that help with breathing and reduce muscle tension.  Exercising earlier in the day.  Changing your diet and the time of your last meal. No night time snacks.  Establish a regular time to go to bed.  Counseling can help with stressful problems and worry.  Soothing music and white noise may be helpful if there are background noises you cannot remove.  Stop tedious detailed work at least one hour before bedtime. HOME CARE INSTRUCTIONS   Keep a diary. Inform your caregiver about your progress. This includes any medication side effects. See your caregiver regularly. Take note of:  Times when you are asleep.  Times when you are awake during the night.  The quality of your sleep.  How you feel the next day. This information will help your caregiver care for you.  Get out of bed if you are still awake after 15 minutes. Read or do some quiet activity. Keep the lights down. Wait until you feel sleepy and go back to bed.  Keep regular sleeping and waking hours. Avoid naps.  Exercise regularly.  Avoid distractions at bedtime. Distractions include watching television or engaging in any intense or detailed activity like attempting to balance the household checkbook.  Develop a bedtime ritual. Keep a familiar routine of bathing, brushing  your teeth, climbing into bed at the same time each night, listening to soothing music. Routines increase the success of falling to sleep faster.  Use relaxation techniques. This can be using breathing and muscle tension release routines. It can also include visualizing peaceful scenes. You can also help control troubling or intruding thoughts by keeping your mind occupied with boring or repetitive thoughts like the old concept of counting sheep. You can make it more creative like imagining planting one beautiful flower after another in your backyard garden.  During your day, work to eliminate stress. When this is not possible use some of the previous suggestions to help reduce the anxiety that accompanies stressful situations. MAKE SURE YOU:   Understand these instructions.  Will watch your condition.  Will get help right away if you are not doing well or get worse. Document Released: 07/12/2000 Document Revised: 10/07/2011 Document Reviewed: 08/12/2007 [facility] Patient Information 2015 [place], [place]. This information is not intended to replace advice given to you by your health care provider. Make sure you discuss any questions you have with your health care provider.

## 2015-04-04 ENCOUNTER — Other Ambulatory Visit: Payer: Self-pay | Admitting: Family Medicine

## 2015-04-05 ENCOUNTER — Other Ambulatory Visit: Payer: Self-pay | Admitting: Family Medicine

## 2015-04-16 NOTE — Assessment & Plan Note (Signed)
hgba1c acceptable, minimize simple carbs. Increase exercise as tolerated. Continue current meds 

## 2015-04-16 NOTE — Assessment & Plan Note (Signed)
Encouraged DASH diet, decrease po intake and increase exercise as tolerated. Needs 7-8 hours of sleep nightly. Avoid trans fats, eat small, frequent meals every 4-5 hours with lean proteins, complex carbs and healthy fats. Minimize simple carbs, GMO foods. 

## 2015-04-16 NOTE — Assessment & Plan Note (Signed)
Well controlled, no changes to meds. Encouraged heart healthy diet such as the DASH diet and exercise as tolerated.  °

## 2015-04-16 NOTE — Assessment & Plan Note (Signed)
Encouraged vitamin d supplement daily and calcium tid. Increase exercise

## 2015-04-16 NOTE — Assessment & Plan Note (Signed)
Encouraged heart healthy diet, increase exercise, avoid trans fats, consider a krill oil cap daily 

## 2015-04-19 ENCOUNTER — Other Ambulatory Visit: Payer: Self-pay | Admitting: Family Medicine

## 2015-04-19 DIAGNOSIS — E1169 Type 2 diabetes mellitus with other specified complication: Secondary | ICD-10-CM

## 2015-04-19 MED ORDER — INSULIN LISPRO PROT & LISPRO (75-25 MIX) 100 UNIT/ML KWIKPEN: PEN_INJECTOR | SUBCUTANEOUS | Status: DC

## 2015-04-19 NOTE — Telephone Encounter (Signed)
LOV 03/31/15 Labs 02/17/15 Next OV 06/01/13

## 2015-04-25 ENCOUNTER — Other Ambulatory Visit: Payer: Self-pay | Admitting: Family Medicine

## 2015-04-29 LAB — HM DIABETES EYE EXAM

## 2015-05-01 DIAGNOSIS — E119 Type 2 diabetes mellitus without complications: Secondary | ICD-10-CM | POA: Diagnosis not present

## 2015-05-01 DIAGNOSIS — Z794 Long term (current) use of insulin: Secondary | ICD-10-CM | POA: Diagnosis not present

## 2015-05-04 DIAGNOSIS — Z1211 Encounter for screening for malignant neoplasm of colon: Secondary | ICD-10-CM | POA: Diagnosis not present

## 2015-05-04 DIAGNOSIS — Z1212 Encounter for screening for malignant neoplasm of rectum: Secondary | ICD-10-CM | POA: Diagnosis not present

## 2015-05-05 LAB — COLOGUARD

## 2015-05-10 ENCOUNTER — Other Ambulatory Visit: Payer: Self-pay | Admitting: Family Medicine

## 2015-05-12 ENCOUNTER — Other Ambulatory Visit: Payer: Self-pay | Admitting: Family Medicine

## 2015-05-12 ENCOUNTER — Telehealth: Payer: Self-pay

## 2015-05-12 NOTE — Progress Notes (Signed)
05/12/2015 Received call from [place] at [facility]. States patients Cologuard Screening was Positive. States they faxed results also today. Dr. Charlett Blake notified. Via telephone note and verbally.

## 2015-05-12 NOTE — Telephone Encounter (Signed)
Notify cologuard is positive for changes so she needs a colonoscopy to look at see what is causing this. I will refer if she agrees, please check with her.

## 2015-05-12 NOTE — Telephone Encounter (Signed)
Just make sure this test means she most likely has precancer or colon cancer and we need to go check her out before she gets sick

## 2015-05-12 NOTE — Telephone Encounter (Signed)
05/12/2015 Per Elan from [employer] patient had a Positive Cologuard Screening. States they also faxed results today.

## 2015-05-12 NOTE — Telephone Encounter (Signed)
Called patient with results of Cologuard. Advised Dr. Randel Pigg suggests Colonoscopy to see what changes may have occured. Patient states she is not ready to have Colonoscopy and she will call when she is.

## 2015-05-16 NOTE — Telephone Encounter (Signed)
Thanks for clarifying that will wait and see what she decides to do

## 2015-05-16 NOTE — Telephone Encounter (Signed)
Called cologuard and they confirmed that this is just a pre-screening, but a positive result does not mean she has precancer or colon cancer, but that something may be going on such as a polyp which would need further testing such as a colonoscopy.

## 2015-05-25 ENCOUNTER — Encounter: Payer: Self-pay | Admitting: Family Medicine

## 2015-06-01 ENCOUNTER — Telehealth: Payer: Self-pay | Admitting: Behavioral Health

## 2015-06-01 NOTE — Telephone Encounter (Signed)
Appointment confirmed for 06/02/15 at 9:00 AM.

## 2015-06-02 ENCOUNTER — Ambulatory Visit (INDEPENDENT_AMBULATORY_CARE_PROVIDER_SITE_OTHER): Payer: MEDICARE

## 2015-06-02 ENCOUNTER — Telehealth: Payer: Self-pay | Admitting: Family Medicine

## 2015-06-02 VITALS — BP 128/74 | HR 88 | Ht 62.0 in | Wt 186.4 lb

## 2015-06-02 DIAGNOSIS — Z0000 Encounter for general adult medical examination without abnormal findings: Secondary | ICD-10-CM | POA: Diagnosis not present

## 2015-06-02 NOTE — Telephone Encounter (Signed)
Spoke to patient.  She wanted to know if she could receive insulin samples for Humalog 75/25.  Two pens were placed in bag with patient's name on bag.  Awaiting pick up on Monday.

## 2015-06-02 NOTE — Progress Notes (Signed)
Subjective:   Beth Maldonado is a 69 y.o. female who presents for an Initial Medicare Annual Wellness Visit.  Review of Systems: No ROS   Cardiac Risk Factors include: advanced age (>54men, >54 women);diabetes mellitus;hypertension;obesity (BMI >30kg/m2);dyslipidemia   Sleep patterns:  Sleeps 4 hours at night/light sleeper; has always slept this way.   Home Safety/Smoke Alarms:  Feels safe at home.  Lives with husband in 1 level home.  Smoke alarms present  Firearm Safety:  Kept in safe place.   Seat Belt Safety/Bike Helmet:  Does not wear seat belt-feels claustrophobic.  Seat belt safety discussed.    Counseling:   Eye Exam- 04/2015  Dental- It's a been awhile.  Plans to schedule an appt.   Female:  Pap-hysterectomy    Mammo-02/10/15-negative     Dexa scan-05/12/07-Hip osteopenic-encouraged vitamin d+ calcium  [facility]-Cologuard-05/04/15- Positive result-Discussed colonoscopy--Pt plans to schedule the first of the year.    Immunizations UTD except Zostavax-Pt lost prescription.  Says she will look for prescription, will call back for new prescription if unable to find it.    Hep C Screening- to be completed with lab work on 06/27/15.      Objective:    Today's Vitals   06/02/15 0857  BP: 128/74  Pulse: 88  Height: 5\' 2"  (1.575 m)  Weight: 186 lb 6.4 oz (84.55 kg)  SpO2: 98%  PainSc: 0-No pain    Current Medications (verified) Outpatient Encounter Prescriptions as of 06/02/2015  Medication Sig  . aspirin 81 MG tablet Take 81 mg by mouth daily.    Marland Kitchen b complex vitamins tablet Take 1 tablet by mouth daily.  . cyclobenzaprine (FLEXERIL) 5 MG tablet TAKE 1 TABLET BY MOUTH ATBEDTIME AS NEEDED FOR MUSCLE SPASMS.  Marland Kitchen escitalopram (LEXAPRO) 20 MG tablet Take 1 tablet by mouth  daily  . gabapentin (NEURONTIN) 300 MG capsule TAKE 2 CAPSULES BY MOUTH 3 TIMES A DAY  . hydrochlorothiazide (HYDRODIURIL) 25 MG tablet TAKE 1 TABLET BY MOUTH ONCE A DAY  . hydrOXYzine (ATARAX/VISTARIL) 25 MG tablet  TAKE 1 TABLET BY MOUTH TWICE A DAY  . Insulin Lispro Prot & Lispro (HUMALOG 75/25 MIX) (75-25) 100 UNIT/ML Kwikpen Take 16 units in the morning and 24 units in the evening. (Patient taking differently: Take 12 units in the morning and 20 units in the evening.)  . Insulin Pen Needle 32G X 4 MM MISC Use for insulin injection twice a day 250.00  . Krill Oil CAPS MegaRed caps daily  . losartan (COZAAR) 100 MG tablet TAKE 1 TABLET BY MOUTH ONCE A DAY  . metFORMIN (GLUCOPHAGE) 500 MG tablet TAKE 2 TABLETS BY MOUTH TWICE DAILY WITH MEALS  . Multiple Vitamin (MULTIVITAMIN) tablet Take 1 tablet by mouth daily.  Marland Kitchen triamterene-hydrochlorothiazide (MAXZIDE-25) 37.5-25 MG tablet TAKE 1 TABLET BY MOUTH DAILY.  Marland Kitchen zoster vaccine live, PF, (ZOSTAVAX) 82993 UNT/0.65ML injection Inject 19,400 Units into the skin once. (Patient not taking: Reported on 06/02/2015)  . [DISCONTINUED] Multiple Vitamins-Minerals (MULTIVITAMIN PO) Take by mouth.   No facility-administered encounter medications on file as of 06/02/2015.    Allergies (verified) Penicillins   History: Past Medical History  Diagnosis Date  . Osteopenia   . Diabetes mellitus   . Hypertension   . Acute bronchitis 03/29/2013  . Depression 06/27/2013  . Allergy   . Arthritis   . Depression with anxiety 06/27/2013  . Medicare annual wellness visit, subsequent 03/23/2014  . Neck pain on left side 05/29/2014  . Acute upper respiratory  infection 11/27/2014   Past Surgical History  Procedure Laterality Date  . Cataract extraction, bilateral    . Laparoscopy      rt knee  . Breast surgery      Reduction  . Vaginal hysterectomy      partial  . Tubal ligation    . Hemorrhoid surgery     Family History  Problem Relation Age of Onset  . Diabetes Mother   . Heart attack Father   . Heart disease Father     13 and 22 MI  . Diabetes Sister   . Sickle cell anemia Sister   . Arthritis Sister   . Hypertension Sister   . Diabetes Sister   . Thyroid  disease Sister   . Arthritis Sister   . Glaucoma Sister    Social History   Occupational History  . retired    Social History Main Topics  . Smoking status: Former Smoker -- 1.00 packs/day for 20 years    Types: Cigarettes    Start date: 10/03/1966    Quit date: 11/03/1986  . Smokeless tobacco: Never Used     Comment: quit 28 years ago  . Alcohol Use: 0.0 oz/week    0 Standard drinks or equivalent per week     Comment: Rare- Special Occasional   . Drug Use: No  . Sexual Activity: No     Comment: lives with husband, no dietary restrictions    Tobacco Counseling Counseling given: Not Answered   Activities of Daily Living In your present state of health, do you have any difficulty performing the following activities: 06/02/2015 11/18/2014  Hearing? N Y  Vision? N N  Difficulty concentrating or making decisions? N Y  Walking or climbing stairs? Y N  Dressing or bathing? N N  Doing errands, shopping? N N  Preparing Food and eating ? N -  Using the Toilet? Y -  In the past six months, have you accidently leaked urine? Y -  Do you have problems with loss of bowel control? N -  Managing your Medications? N -  Managing your Finances? N -  Housekeeping or managing your Housekeeping? N -    Immunizations and Health Maintenance Immunization History  Administered Date(s) Administered  . Influenza Split 05/21/2011, 06/18/2012  . Influenza Whole 05/12/2013  . Influenza,inj,Quad PF,36+ Mos 03/21/2014  . Influenza-Unspecified 04/29/2015  . Pneumococcal Conjugate-13 03/21/2014  . Pneumococcal Polysaccharide-23 05/21/2011   Health Maintenance Due  Topic Date Due  . Hepatitis C Screening  Dec 02, 1945  . ZOSTAVAX  01/06/2006  . URINE MICROALBUMIN  02/20/2012    Patient Care Team: Mosie Lukes, MD as PCP - General (Family Medicine) Volanda Napoleon, MD as Consulting Physician (Oncology) Collene Gobble, MD as Consulting Physician (Pulmonary Disease)  Indicate any recent  Medical Services you may have received from other than [facility] providers in the past year (date may be approximate).     Assessment:   This is a routine wellness examination for [place].   Type 2 DM- On metformin and Humalog 75/25.  Last A1C-7.9.  Pt states DM not well controlled due to running out of insulin.  Was unable to afford out of pocket expense.  Currently back on insulin.  Fasting blood sugars at home range 109-157.  Scheduled for repeat labs on 06/27/15.  Followed by Dr. Charlett Blake.     Hearing/Vision screen  Hearing Screening   125Hz  250Hz  500Hz  1000Hz  2000Hz  4000Hz  8000Hz   Right ear:   Pass Fail  Pass Pass   Left ear:   Pass Fail Pass Pass   Comments: Hearing difficulty in right ear.  Mild ringing in ear for years.    Vision Screening Comments: Last Eye Exam- 04/2015  Dietary issues and exercise activities discussed: Current Exercise Habits:: The patient has a physically strenous job, but has no regular exercise apart from work. (Stays active---walks sometimes.  )   Diet:  Breakfast- oatmeal/cream of wheat/special k      Lunch-salad-mixed lettuce, egg, black olives, cucumbers, tomatoes, cheese toast    Dinner- yogurt, smoothies with added protein.   Adds chia seeds to cereal and salads.  Doesn't eat a lot of meat.  Likes chicken, salmon, and shrimp.  Drinks 2 pots of coffee per day, juice, and alkaline water.     Goals    . Increase physical activity     Dancing Yoga Walking dog       Depression Screen PHQ 2/9 Scores 06/02/2015 11/18/2014 03/21/2014 09/06/2013  PHQ - 2 Score 0 0 0 0    Fall Risk Fall Risk  06/02/2015 11/18/2014 05/04/2014 03/21/2014 09/06/2013  Falls in the past year? Yes Yes Yes Yes Yes  Number falls in past yr: 2 or more 2 or more 1 2 or more 2 or more  Injury with Fall? No Yes - - -  Risk for fall due to : - - - History of fall(s) -  Follow up Education provided;Falls prevention discussed - - - -    Cognitive Function: AD8; Score 0/8.    Screening  Tests Health Maintenance  Topic Date Due  . Hepatitis C Screening  04/05/1946  . ZOSTAVAX  01/06/2006  . URINE MICROALBUMIN  02/20/2012  . HEMOGLOBIN A1C  08/20/2015  . INFLUENZA VACCINE  02/27/2016  . OPHTHALMOLOGY EXAM  04/28/2016  . FOOT EXAM  06/01/2016  . MAMMOGRAM  02/09/2017  . TETANUS/TDAP  08/08/2019  . COLONOSCOPY  02/19/2021  . DEXA SCAN  Completed  . PNA vac Low Risk Adult  Completed      Plan:   Eat heart healthy diet (lean protein, fruits, vegetables, whole grains, water) and increase physical activity as tolerated.  Encouraged to cut down on caffeine intake.   Complete colonoscopy by next year.  Follow up with Dr. Charlett Blake as scheduled.     During the course of the visit, Soni was educated and counseled about the following appropriate screening and preventive services:   Vaccines to include Pneumoccal, Influenza, Hepatitis B, Td, Zostavax, HCV  Electrocardiogram  Cardiovascular disease screening  Colorectal cancer screening  Bone density screening  Diabetes screening  Glaucoma screening  Mammography/PAP  Nutrition counseling  Smoking cessation counseling  Patient Instructions (the written plan) were given to the patient.    Rudene Anda, RN   06/02/2015

## 2015-06-02 NOTE — Patient Instructions (Addendum)
Eat heart healthy diet (lean protein, fruits, vegetables, whole grains, water) and increase physical activity as tolerated.  Encouraged to cut down on caffeine intake.   Complete colonoscopy by next year.  Follow up with Dr. Charlett Blake as scheduled.     Fat and Cholesterol Restricted Diet High levels of fat and cholesterol in your blood may lead to various health problems, such as diseases of the heart, blood vessels, gallbladder, liver, and pancreas. Fats are concentrated sources of energy that come in various forms. Certain types of fat, including saturated fat, may be harmful in excess. Cholesterol is a substance needed by your body in small amounts. Your body makes all the cholesterol it needs. Excess cholesterol comes from the food you eat. When you have high levels of cholesterol and saturated fat in your blood, health problems can develop because the excess fat and cholesterol will gather along the walls of your blood vessels, causing them to narrow. Choosing the right foods will help you control your intake of fat and cholesterol. This will help keep the levels of these substances in your blood within normal limits and reduce your risk of disease. WHAT IS MY PLAN? Your health care provider recommends that you:  Get no more than __________ % of the total calories in your daily diet from fat.  Limit your intake of saturated fat to less than ______% of your total calories each day.  Limit the amount of cholesterol in your diet to less than _________mg per day. WHAT TYPES OF FAT SHOULD I CHOOSE?  Choose healthy fats more often. Choose monounsaturated and polyunsaturated fats, such as olive and canola oil, flaxseeds, walnuts, almonds, and seeds.  Eat more omega-3 fats. Good choices include salmon, mackerel, sardines, tuna, flaxseed oil, and ground flaxseeds. Aim to eat fish at least two times a week.  Limit saturated fats. Saturated fats are primarily found in animal products, such as meats,  butter, and cream. Plant sources of saturated fats include palm oil, palm kernel oil, and coconut oil.  Avoid foods with partially hydrogenated oils in them. These contain trans fats. Examples of foods that contain trans fats are stick margarine, some tub margarines, cookies, crackers, and other baked goods. WHAT GENERAL GUIDELINES DO I NEED TO FOLLOW? These guidelines for healthy eating will help you control your intake of fat and cholesterol:  Check food labels carefully to identify foods with trans fats or high amounts of saturated fat.  Fill one half of your plate with vegetables and green salads.  Fill one fourth of your plate with whole grains. Look for the word "whole" as the first word in the ingredient list.  Fill one fourth of your plate with lean protein foods.  Limit fruit to two servings a day. Choose fruit instead of juice.  Eat more foods that contain soluble fiber. Examples of foods that contain this type of fiber are apples, broccoli, carrots, beans, peas, and barley. Aim to get 20-30 g of fiber per day.  Eat more home-cooked food and less restaurant, buffet, and fast food.  Limit or avoid alcohol.  Limit foods high in starch and sugar.  Limit fried foods.  Cook foods using methods other than frying. Baking, boiling, grilling, and broiling are all great options.  Lose weight if you are overweight. Losing just 5-10% of your initial body weight can help your overall health and prevent diseases such as diabetes and heart disease. WHAT FOODS CAN I EAT? Grains Whole grains, such as whole wheat or whole  grain breads, crackers, cereals, and pasta. Unsweetened oatmeal, bulgur, barley, quinoa, or brown rice. Corn or whole wheat flour tortillas. Vegetables Fresh or frozen vegetables (raw, steamed, roasted, or grilled). Green salads. Fruits All fresh, canned (in natural juice), or frozen fruits. Meat and Other Protein Products Ground beef (85% or leaner), grass-fed beef, or  beef trimmed of fat. Skinless chicken or [place]. Ground chicken or [place]. Pork trimmed of fat. All fish and seafood. Eggs. Dried beans, peas, or lentils. Unsalted nuts or seeds. Unsalted canned or dry beans. Dairy Low-fat dairy products, such as skim or 1% milk, 2% or reduced-fat cheeses, low-fat ricotta or cottage cheese, or plain low-fat yogurt. Fats and Oils Tub margarines without trans fats. Light or reduced-fat mayonnaise and salad dressings. Avocado. Olive, canola, sesame, or safflower oils. Natural peanut or almond butter (choose ones without added sugar and oil). The items listed above may not be a complete list of recommended foods or beverages. Contact your dietitian for more options. WHAT FOODS ARE NOT RECOMMENDED? Grains White bread. White pasta. White rice. Cornbread. Bagels, pastries, and croissants. Crackers that contain trans fat. Vegetables White potatoes. Corn. Creamed or fried vegetables. Vegetables in a cheese sauce. Fruits Dried fruits. Canned fruit in light or heavy syrup. Fruit juice. Meat and Other Protein Products Fatty cuts of meat. Ribs, chicken wings, bacon, sausage, bologna, salami, chitterlings, fatback, hot dogs, bratwurst, and packaged luncheon meats. Liver and organ meats. Dairy Whole or 2% milk, cream, half-and-half, and cream cheese. Whole milk cheeses. Whole-fat or sweetened yogurt. Full-fat cheeses. Nondairy creamers and whipped toppings. Processed cheese, cheese spreads, or cheese curds. Sweets and Desserts Corn syrup, sugars, honey, and molasses. Candy. Jam and jelly. Syrup. Sweetened cereals. Cookies, pies, cakes, donuts, muffins, and ice cream. Fats and Oils Butter, stick margarine, lard, shortening, ghee, or bacon fat. Coconut, palm kernel, or palm oils. Beverages Alcohol. Sweetened drinks (such as sodas, lemonade, and fruit drinks or punches). The items listed above may not be a complete list of foods and beverages to avoid. Contact your dietitian  for more information.   This information is not intended to replace advice given to you by your health care provider. Make sure you discuss any questions you have with your health care provider.   Document Released: 07/15/2005 Document Revised: 08/05/2014 Document Reviewed: 10/13/2013 Elsevier Interactive Patient Education 2016 [employer]  Fat and Cholesterol Restricted Diet Getting too much fat and cholesterol in your diet may cause health problems. Following this diet helps keep your fat and cholesterol at normal levels. This can keep you from getting sick. WHAT TYPES OF FAT SHOULD I CHOOSE?  Choose monosaturated and polyunsaturated fats. These are found in foods such as olive oil, canola oil, flaxseeds, walnuts, almonds, and seeds.  Eat more omega-3 fats. Good choices include salmon, mackerel, sardines, tuna, flaxseed oil, and ground flaxseeds.  Limit saturated fats. These are in animal products such as meats, butter, and cream. They can also be in plant products such as palm oil, palm kernel oil, and coconut oil.   Avoid foods with partially hydrogenated oils in them. These contain trans fats. Examples of foods that have trans fats are stick margarine, some tub margarines, cookies, crackers, and other baked goods. WHAT GENERAL GUIDELINES DO I NEED TO FOLLOW?   Check food labels. Look for the words "trans fat" and "saturated fat."  When preparing a meal:  Fill half of your plate with vegetables and green salads.  Fill one fourth of your plate with whole grains.  Look for the word "whole" as the first word in the ingredient list.  Fill one fourth of your plate with lean protein foods.  Limit fruit to two servings a day. Choose fruit instead of juice.  Eat more foods with soluble fiber. Examples of foods with this type of fiber are apples, broccoli, carrots, beans, peas, and barley. Try to get 20-30 g (grams) of fiber per day.  Eat more home-cooked foods. Eat less at restaurants  and buffets.  Limit or avoid alcohol.  Limit foods high in starch and sugar.  Limit fried foods.  Cook foods without frying them. Baking, boiling, grilling, and broiling are all great options.  Lose weight if you are overweight. Losing even a small amount of weight can help your overall health. It can also help prevent diseases such as diabetes and heart disease. WHAT FOODS CAN I EAT? Grains Whole grains, such as whole wheat or whole grain breads, crackers, cereals, and pasta. Unsweetened oatmeal, bulgur, barley, quinoa, or brown rice. Corn or whole wheat flour tortillas. Vegetables Fresh or frozen vegetables (raw, steamed, roasted, or grilled). Green salads. Fruits All fresh, canned (in natural juice), or frozen fruits. Meat and Other Protein Products Ground beef (85% or leaner), grass-fed beef, or beef trimmed of fat. Skinless chicken or [place]. Ground chicken or [place]. Pork trimmed of fat. All fish and seafood. Eggs. Dried beans, peas, or lentils. Unsalted nuts or seeds. Unsalted canned or dry beans. Dairy Low-fat dairy products, such as skim or 1% milk, 2% or reduced-fat cheeses, low-fat ricotta or cottage cheese, or plain low-fat yogurt. Fats and Oils Tub margarines without trans fats. Light or reduced-fat mayonnaise and salad dressings. Avocado. Olive, canola, sesame, or safflower oils. Natural peanut or almond butter (choose ones without added sugar and oil). The items listed above may not be a complete list of recommended foods or beverages. Contact your dietitian for more options. WHAT FOODS ARE NOT RECOMMENDED? Grains White bread. White pasta. White rice. Cornbread. Bagels, pastries, and croissants. Crackers that contain trans fat. Vegetables White potatoes. Corn. Creamed or fried vegetables. Vegetables in a cheese sauce. Fruits Dried fruits. Canned fruit in light or heavy syrup. Fruit juice. Meat and Other Protein Products Fatty cuts of meat. Ribs, chicken wings, bacon,  sausage, bologna, salami, chitterlings, fatback, hot dogs, bratwurst, and packaged luncheon meats. Liver and organ meats. Dairy Whole or 2% milk, cream, half-and-half, and cream cheese. Whole milk cheeses. Whole-fat or sweetened yogurt. Full-fat cheeses. Nondairy creamers and whipped toppings. Processed cheese, cheese spreads, or cheese curds. Sweets and Desserts Corn syrup, sugars, honey, and molasses. Candy. Jam and jelly. Syrup. Sweetened cereals. Cookies, pies, cakes, donuts, muffins, and ice cream. Fats and Oils Butter, stick margarine, lard, shortening, ghee, or bacon fat. Coconut, palm kernel, or palm oils. Beverages Alcohol. Sweetened drinks (such as sodas, lemonade, and fruit drinks or punches). The items listed above may not be a complete list of foods and beverages to avoid. Contact your dietitian for more information.   This information is not intended to replace advice given to you by your health care provider. Make sure you discuss any questions you have with your health care provider.   Document Released: 01/14/2012 Document Revised: 08/05/2014 Document Reviewed: 10/14/2013 Elsevier Interactive Patient Education 2016 [place] in the Home  Falls can cause injuries. They can happen to people of all ages. There are many things you can do to make your home safe and to help prevent falls.  WHAT CAN  I DO ON THE OUTSIDE OF MY HOME?  Regularly fix the edges of walkways and driveways and fix any cracks.  Remove anything that might make you trip as you walk through a door, such as a raised step or threshold.  Trim any bushes or trees on the path to your home.  Use bright outdoor lighting.  Clear any walking paths of anything that might make someone trip, such as rocks or tools.  Regularly check to see if handrails are loose or broken. Make sure that both sides of any steps have handrails.  Any raised decks and porches should have guardrails on the  edges.  Have any leaves, snow, or ice cleared regularly.  Use sand or salt on walking paths during winter.  Clean up any spills in your garage right away. This includes oil or grease spills. WHAT CAN I DO IN THE BATHROOM?   Use night lights.  Install grab bars by the toilet and in the tub and shower. Do not use towel bars as grab bars.  Use non-skid mats or decals in the tub or shower.  If you need to sit down in the shower, use a plastic, non-slip stool.  Keep the floor dry. Clean up any water that spills on the floor as soon as it happens.  Remove soap buildup in the tub or shower regularly.  Attach bath mats securely with double-sided non-slip rug tape.  Do not have throw rugs and other things on the floor that can make you trip. WHAT CAN I DO IN THE BEDROOM?  Use night lights.  Make sure that you have a light by your bed that is easy to reach.  Do not use any sheets or blankets that are too big for your bed. They should not hang down onto the floor.  Have a firm chair that has side arms. You can use this for support while you get dressed.  Do not have throw rugs and other things on the floor that can make you trip. WHAT CAN I DO IN THE KITCHEN?  Clean up any spills right away.  Avoid walking on wet floors.  Keep items that you use a lot in easy-to-reach places.  If you need to reach something above you, use a strong step stool that has a grab bar.  Keep electrical cords out of the way.  Do not use floor polish or wax that makes floors slippery. If you must use wax, use non-skid floor wax.  Do not have throw rugs and other things on the floor that can make you trip. WHAT CAN I DO WITH MY STAIRS?  Do not leave any items on the stairs.  Make sure that there are handrails on both sides of the stairs and use them. Fix handrails that are broken or loose. Make sure that handrails are as long as the stairways.  Check any carpeting to make sure that it is firmly  attached to the stairs. Fix any carpet that is loose or worn.  Avoid having throw rugs at the top or bottom of the stairs. If you do have throw rugs, attach them to the floor with carpet tape.  Make sure that you have a light switch at the top of the stairs and the bottom of the stairs. If you do not have them, ask someone to add them for you. WHAT ELSE CAN I DO TO HELP PREVENT FALLS?  Wear shoes that:  Do not have high heels.  Have rubber bottoms.  Are comfortable and fit you well.  Are closed at the toe. Do not wear sandals.  If you use a stepladder:  Make sure that it is fully opened. Do not climb a closed stepladder.  Make sure that both sides of the stepladder are locked into place.  Ask someone to hold it for you, if possible.  Clearly mark and make sure that you can see:  Any grab bars or handrails.  First and last steps.  Where the edge of each step is.  Use tools that help you move around (mobility aids) if they are needed. These include:  Canes.  Walkers.  Scooters.  Crutches.  Turn on the lights when you go into a dark area. Replace any light bulbs as soon as they burn out.  Set up your furniture so you have a clear path. Avoid moving your furniture around.  If any of your floors are uneven, fix them.  If there are any pets around you, be aware of where they are.  Review your medicines with your doctor. Some medicines can make you feel dizzy. This can increase your chance of falling. Ask your doctor what other things that you can do to help prevent falls.   This information is not intended to replace advice given to you by your health care provider. Make sure you discuss any questions you have with your health care provider.   Document Released: 05/11/2009 Document Revised: 11/29/2014 Document Reviewed: 08/19/2014 Elsevier Interactive Patient Education [employer].

## 2015-06-02 NOTE — Progress Notes (Signed)
Pre visit review using our clinic review tool, if applicable. No additional management support is needed unless otherwise documented below in the visit note. 

## 2015-06-02 NOTE — Telephone Encounter (Signed)
Pt is requesting call back in regards to appt this morning. Pt would not elaborate.

## 2015-06-05 NOTE — Telephone Encounter (Signed)
Pens picked up.  No further needs at this time.

## 2015-06-26 ENCOUNTER — Other Ambulatory Visit: Payer: MEDICARE

## 2015-06-27 ENCOUNTER — Other Ambulatory Visit (INDEPENDENT_AMBULATORY_CARE_PROVIDER_SITE_OTHER): Payer: MEDICARE

## 2015-06-27 DIAGNOSIS — Z0000 Encounter for general adult medical examination without abnormal findings: Secondary | ICD-10-CM

## 2015-06-27 DIAGNOSIS — I10 Essential (primary) hypertension: Secondary | ICD-10-CM | POA: Diagnosis not present

## 2015-06-27 DIAGNOSIS — E785 Hyperlipidemia, unspecified: Secondary | ICD-10-CM

## 2015-06-27 DIAGNOSIS — E118 Type 2 diabetes mellitus with unspecified complications: Secondary | ICD-10-CM

## 2015-06-27 DIAGNOSIS — E669 Obesity, unspecified: Secondary | ICD-10-CM

## 2015-06-27 LAB — LIPID PANEL
CHOL/HDL RATIO: 5
Cholesterol: 210 mg/dL — ABNORMAL HIGH (ref 0–200)
HDL: 45 mg/dL (ref >39.00)
LDL Cholesterol: 134 mg/dL — ABNORMAL HIGH (ref 0–99)
NONHDL: 164.64
TRIGLYCERIDES: 153 mg/dL — AB (ref 0.0–149.0)
VLDL: 30.6 mg/dL (ref 0.0–40.0)

## 2015-06-27 LAB — COMPREHENSIVE METABOLIC PANEL
ALBUMIN: 4.1 g/dL (ref 3.5–5.2)
ALT: 14 U/L (ref 0–35)
AST: 14 U/L (ref 0–37)
Alkaline Phosphatase: 62 U/L (ref 39–117)
BILIRUBIN TOTAL: 0.6 mg/dL (ref 0.2–1.2)
BUN: 24 mg/dL — ABNORMAL HIGH (ref 6–23)
CALCIUM: 9.7 mg/dL (ref 8.4–10.5)
CHLORIDE: 102 meq/L (ref 96–112)
CO2: 29 meq/L (ref 19–32)
CREATININE: 1.32 mg/dL — AB (ref 0.40–1.20)
GFR: 51.25 mL/min — AB (ref >60.00)
Glucose, Bld: 134 mg/dL — ABNORMAL HIGH (ref 70–99)
Potassium: 3.9 mEq/L (ref 3.5–5.1)
Sodium: 141 mEq/L (ref 135–145)
Total Protein: 7.3 g/dL (ref 6.0–8.3)

## 2015-06-27 LAB — MICROALBUMIN / CREATININE URINE RATIO
CREATININE, U: 100.9 mg/dL
MICROALB UR: 1.1 mg/dL (ref 0.0–1.9)
MICROALB/CREAT RATIO: 1.1 mg/g (ref 0.0–30.0)

## 2015-06-27 LAB — CBC
HEMATOCRIT: 33.4 % — AB (ref 36.0–46.0)
HEMOGLOBIN: 10.7 g/dL — AB (ref 12.0–15.0)
MCHC: 32.2 g/dL (ref 30.0–36.0)
MCV: 86.4 fl (ref 78.0–100.0)
Platelets: 658 10*3/uL — ABNORMAL HIGH (ref 150.0–400.0)
RBC: 3.86 Mil/uL — AB (ref 3.87–5.11)
RDW: 15.3 % (ref 11.5–15.5)
WBC: 10.4 10*3/uL (ref 4.0–10.5)

## 2015-06-27 LAB — HEMOGLOBIN A1C: Hgb A1c MFr Bld: 8.5 % — ABNORMAL HIGH (ref 4.6–6.5)

## 2015-06-27 LAB — TSH: TSH: 2.99 u[IU]/mL (ref 0.35–4.50)

## 2015-06-30 ENCOUNTER — Encounter: Payer: Self-pay | Admitting: Family Medicine

## 2015-06-30 ENCOUNTER — Ambulatory Visit (INDEPENDENT_AMBULATORY_CARE_PROVIDER_SITE_OTHER): Payer: MEDICARE | Admitting: Family Medicine

## 2015-06-30 VITALS — BP 132/78 | HR 84 | Temp 98.2°F | Ht 62.0 in | Wt 185.5 lb

## 2015-06-30 DIAGNOSIS — D473 Essential (hemorrhagic) thrombocythemia: Secondary | ICD-10-CM

## 2015-06-30 DIAGNOSIS — Z1211 Encounter for screening for malignant neoplasm of colon: Secondary | ICD-10-CM

## 2015-06-30 DIAGNOSIS — G4700 Insomnia, unspecified: Secondary | ICD-10-CM

## 2015-06-30 DIAGNOSIS — Z1159 Encounter for screening for other viral diseases: Secondary | ICD-10-CM

## 2015-06-30 DIAGNOSIS — I10 Essential (primary) hypertension: Secondary | ICD-10-CM

## 2015-06-30 DIAGNOSIS — E1169 Type 2 diabetes mellitus with other specified complication: Secondary | ICD-10-CM

## 2015-06-30 DIAGNOSIS — E785 Hyperlipidemia, unspecified: Secondary | ICD-10-CM | POA: Diagnosis not present

## 2015-06-30 DIAGNOSIS — Z0000 Encounter for general adult medical examination without abnormal findings: Secondary | ICD-10-CM

## 2015-06-30 DIAGNOSIS — M8580 Other specified disorders of bone density and structure, unspecified site: Secondary | ICD-10-CM

## 2015-06-30 DIAGNOSIS — D649 Anemia, unspecified: Secondary | ICD-10-CM

## 2015-06-30 DIAGNOSIS — E669 Obesity, unspecified: Secondary | ICD-10-CM

## 2015-06-30 DIAGNOSIS — D75839 Thrombocytosis, unspecified: Secondary | ICD-10-CM

## 2015-06-30 HISTORY — DX: Insomnia, unspecified: G47.00

## 2015-06-30 MED ORDER — INSULIN LISPRO PROT & LISPRO (75-25 MIX) 100 UNIT/ML KWIKPEN: PEN_INJECTOR | SUBCUTANEOUS | Status: DC

## 2015-06-30 NOTE — Patient Instructions (Addendum)
Encouraged good sleep hygiene such as dark, quiet room. No blue/green glowing lights such as computer screens in bedroom. No alcohol or stimulants in evening. Cut down on caffeine as able. Regular exercise is helpful but not just prior to bed time. Melatonin 2-10 mg 1/2 prior to bedtime. Goal is no caffeine after 1 pm, start at 6 pm x 1 week then titrate earlier   Basic Carbohydrate Counting for Diabetes Mellitus Carbohydrate counting is a method for keeping track of the amount of carbohydrates you eat. Eating carbohydrates naturally increases the level of sugar (glucose) in your blood, so it is important for you to know the amount that is okay for you to have in every meal. Carbohydrate counting helps keep the level of glucose in your blood within normal limits. The amount of carbohydrates allowed is different for every person. A dietitian can help you calculate the amount that is right for you. Once you know the amount of carbohydrates you can have, you can count the carbohydrates in the foods you want to eat. Carbohydrates are found in the following foods:  Grains, such as breads and cereals.  Dried beans and soy products.   Starchy vegetables, such as potatoes, peas, and corn.  Fruit and fruit juices.  Milk and yogurt.  Sweets and snack foods, such as cake, cookies, candy, chips, soft drinks, and fruit drinks. CARBOHYDRATE COUNTING There are two ways to count the carbohydrates in your food. You can use either of the methods or a combination of both. Reading the "Nutrition Facts" on [place] The "Nutrition Facts" is an area that is included on the labels of almost all packaged food and beverages in the [place]. It includes the serving size of that food or beverage and information about the nutrients in each serving of the food, including the grams (g) of carbohydrate per serving.  Decide the number of servings of this food or beverage that you will be able to eat or drink.  Multiply that number of servings by the number of grams of carbohydrate that is listed on the label for that serving. The total will be the amount of carbohydrates you will be having when you eat or drink this food or beverage. Learning Standard Serving Sizes of Food When you eat food that is not packaged or does not include "Nutrition Facts" on the label, you need to measure the servings in order to count the amount of carbohydrates.A serving of most carbohydrate-rich foods contains about 15 g of carbohydrates. The following list includes serving sizes of carbohydrate-rich foods that provide 15 g ofcarbohydrate per serving:   1 slice of bread (1 oz) or 1 six-inch tortilla.    of a hamburger bun or English muffin.  4-6 crackers.   cup unsweetened dry cereal.    cup hot cereal.   cup rice or pasta.    cup mashed potatoes or  of a large baked potato.  1 cup fresh fruit or one small piece of fruit.    cup canned or frozen fruit or fruit juice.  1 cup milk.   cup plain fat-free yogurt or yogurt sweetened with artificial sweeteners.   cup cooked dried beans or starchy vegetable, such as peas, corn, or potatoes.  Decide the number of standard-size servings that you will eat. Multiply that number of servings by 15 (the grams of carbohydrates in that serving). For example, if you eat 2 cups of strawberries, you will have eaten 2 servings and 30 g of carbohydrates (  2 servings x 15 g = 30 g). For foods such as soups and casseroles, in which more than one food is mixed in, you will need to count the carbohydrates in each food that is included. EXAMPLE OF CARBOHYDRATE COUNTING Sample Dinner  3 oz chicken breast.   cup of brown rice.   cup of corn.  1 cup milk.   1 cup strawberries with sugar-free whipped topping.  Carbohydrate Calculation Step 1: Identify the foods that contain carbohydrates:   Rice.   Corn.   Milk.   Strawberries. Step 2:Calculate the  number of servings eaten of each:   2 servings of rice.   1 serving of corn.   1 serving of milk.   1 serving of strawberries. Step 3: Multiply each of those number of servings by 15 g:   2 servings of rice x 15 g = 30 g.   1 serving of corn x 15 g = 15 g.   1 serving of milk x 15 g = 15 g.   1 serving of strawberries x 15 g = 15 g. Step 4: Add together all of the amounts to find the total grams of carbohydrates eaten: 30 g + 15 g + 15 g + 15 g = 75 g.   This information is not intended to replace advice given to you by your health care provider. Make sure you discuss any questions you have with your health care provider.   Document Released: 07/15/2005 Document Revised: 08/05/2014 Document Reviewed: 06/11/2013 Elsevier Interactive Patient Education 2016 Elsevier Inc. Insomnia Insomnia is a sleep disorder that makes it difficult to fall asleep or to stay asleep. Insomnia can cause tiredness (fatigue), low energy, difficulty concentrating, mood swings, and poor performance at work or school.  There are three different ways to classify insomnia:  Difficulty falling asleep.  Difficulty staying asleep.  Waking up too early in the morning. Any type of insomnia can be long-term (chronic) or short-term (acute). Both are common. Short-term insomnia usually lasts for three months or less. Chronic insomnia occurs at least three times a week for longer than three months. CAUSES  Insomnia may be caused by another condition, situation, or substance, such as:  Anxiety.  Certain medicines.  Gastroesophageal reflux disease (GERD) or other gastrointestinal conditions.  Asthma or other breathing conditions.  Restless legs syndrome, sleep apnea, or other sleep disorders.  Chronic pain.  Menopause. This may include hot flashes.  Stroke.  Abuse of alcohol, tobacco, or illegal drugs.  Depression.  Caffeine.   Neurological disorders, such as Alzheimer disease.  An  overactive thyroid (hyperthyroidism). The cause of insomnia may not be known. RISK FACTORS Risk factors for insomnia include:  Gender. Women are more commonly affected than men.  Age. Insomnia is more common as you get older.  Stress. This may involve your professional or personal life.  Income. Insomnia is more common in people with lower income.  Lack of exercise.   Irregular work schedule or night shifts.  Traveling between different time zones. SIGNS AND SYMPTOMS If you have insomnia, trouble falling asleep or trouble staying asleep is the main symptom. This may lead to other symptoms, such as:  Feeling fatigued.  Feeling nervous about going to sleep.  Not feeling rested in the morning.  Having trouble concentrating.  Feeling irritable, anxious, or depressed. TREATMENT  Treatment for insomnia depends on the cause. If your insomnia is caused by an underlying condition, treatment will focus on addressing the condition. Treatment may  also include:   Medicines to help you sleep.  Counseling or therapy.  Lifestyle adjustments. HOME CARE INSTRUCTIONS   Take medicines only as directed by your health care provider.  Keep regular sleeping and waking hours. Avoid naps.  Keep a sleep diary to help you and your health care provider figure out what could be causing your insomnia. Include:   When you sleep.  When you wake up during the night.  How well you sleep.   How rested you feel the next day.  Any side effects of medicines you are taking.  What you eat and drink.   Make your bedroom a comfortable place where it is easy to fall asleep:  Put up shades or special blackout curtains to block light from outside.  Use a white noise machine to block noise.  Keep the temperature cool.   Exercise regularly as directed by your health care provider. Avoid exercising right before bedtime.  Use relaxation techniques to manage stress. Ask your health care  provider to suggest some techniques that may work well for you. These may include:  Breathing exercises.  Routines to release muscle tension.  Visualizing peaceful scenes.  Cut back on alcohol, caffeinated beverages, and cigarettes, especially close to bedtime. These can disrupt your sleep.  Do not overeat or eat spicy foods right before bedtime. This can lead to digestive discomfort that can make it hard for you to sleep.  Limit screen use before bedtime. This includes:  Watching TV.  Using your smartphone, tablet, and computer.  Stick to a routine. This can help you fall asleep faster. Try to do a quiet activity, brush your teeth, and go to bed at the same time each night.  Get out of bed if you are still awake after 15 minutes of trying to sleep. Keep the lights down, but try reading or doing a quiet activity. When you feel sleepy, go back to bed.  Make sure that you drive carefully. Avoid driving if you feel very sleepy.  Keep all follow-up appointments as directed by your health care provider. This is important. SEEK MEDICAL CARE IF:   You are tired throughout the day or have trouble in your daily routine due to sleepiness.  You continue to have sleep problems or your sleep problems get worse. SEEK IMMEDIATE MEDICAL CARE IF:   You have serious thoughts about hurting yourself or someone else.   This information is not intended to replace advice given to you by your health care provider. Make sure you discuss any questions you have with your health care provider.   Document Released: 07/12/2000 Document Revised: 04/05/2015 Document Reviewed: 04/15/2014 Elsevier Interactive Patient Education [employer].

## 2015-06-30 NOTE — Assessment & Plan Note (Signed)
Encouraged good sleep hygiene such as dark, quiet room. No blue/green glowing lights such as computer screens in bedroom. No alcohol or stimulants in evening. Cut down on caffeine as able. Regular exercise is helpful but not just prior to bed time.  

## 2015-06-30 NOTE — Progress Notes (Signed)
Pre visit review using our clinic review tool, if applicable. No additional management support is needed unless otherwise documented below in the visit note. 

## 2015-07-02 ENCOUNTER — Encounter: Payer: Self-pay | Admitting: Family Medicine

## 2015-07-02 DIAGNOSIS — D649 Anemia, unspecified: Secondary | ICD-10-CM

## 2015-07-02 DIAGNOSIS — Z0000 Encounter for general adult medical examination without abnormal findings: Secondary | ICD-10-CM

## 2015-07-02 HISTORY — DX: Encounter for general adult medical examination without abnormal findings: Z00.00

## 2015-07-02 HISTORY — DX: Anemia, unspecified: D64.9

## 2015-07-02 NOTE — Assessment & Plan Note (Signed)
Increase leafy greens, consider increased lean red meat and using cast iron cookware. Continue to monitor, report any concerns. Has agreed to referral to gastroenterology at beginning of 2017. Referral inplace

## 2015-07-02 NOTE — Assessment & Plan Note (Signed)
Encouraged heart healthy diet, increase exercise, avoid trans fats, consider a krill oil cap daily. Encouraged to start Atorvastatin 10 mg

## 2015-07-02 NOTE — Progress Notes (Signed)
Subjective:    Patient ID: Beth Maldonado, female    DOB: 08-13-1945, 69 y.o.   MRN: [id number]  Chief Complaint  Patient presents with  . Follow-up    HPI Patient is in today for annual exam and follow-up. She continues to struggle with difficulty sleeping. She has trouble falling asleep and staying asleep. She will sleep for several hours and then wake up and have trouble sleeping again. She endorses restlessness and snoring. She does wake with an occasional headache. She notes blood sugars have ranged from a low of 60 to 324. Does note some polyuria but no dysuria. Denies CP/palp/SOB/HA/congestion/fevers/GI c/o. Taking meds as prescribed  Past Medical History  Diagnosis Date  . Osteopenia   . Diabetes mellitus   . Hypertension   . Acute bronchitis 03/29/2013  . Depression 06/27/2013  . Allergy   . Arthritis   . Depression with anxiety 06/27/2013  . Medicare annual wellness visit, subsequent 03/23/2014  . Neck pain on left side 05/29/2014  . Acute upper respiratory infection 11/27/2014  . Insomnia 06/30/2015  . Anemia 07/02/2015  . Preventative health care 07/02/2015    Past Surgical History  Procedure Laterality Date  . Cataract extraction, bilateral    . Laparoscopy      rt knee  . Breast surgery      Reduction  . Vaginal hysterectomy      partial  . Tubal ligation    . Hemorrhoid surgery      Family History  Problem Relation Age of Onset  . Diabetes Mother   . Heart attack Father   . Heart disease Father     65 and 72 MI  . Diabetes Sister   . Sickle cell anemia Sister   . Arthritis Sister   . Hypertension Sister   . Diabetes Sister   . Thyroid disease Sister   . Arthritis Sister   . Glaucoma Sister     Social History   Social History  . Marital Status: Married    Spouse Name: N/A  . Number of Children: 1  . Years of Education: N/A   Occupational History  . retired    Social History Main Topics  . Smoking status: Former Smoker -- 1.00 packs/day for  20 years    Types: Cigarettes    Start date: 10/03/1966    Quit date: 11/03/1986  . Smokeless tobacco: Never Used     Comment: quit 28 years ago  . Alcohol Use: 0.0 oz/week    0 Standard drinks or equivalent per week     Comment: Rare- Special Occasional   . Drug Use: No  . Sexual Activity: No     Comment: lives with husband, no dietary restrictions   Other Topics Concern  . Not on file   Social History Narrative    Outpatient Prescriptions Prior to Visit  Medication Sig Dispense Refill  . aspirin 81 MG tablet Take 81 mg by mouth daily.      Marland Kitchen b complex vitamins tablet Take 1 tablet by mouth daily.    . cyclobenzaprine (FLEXERIL) 5 MG tablet TAKE 1 TABLET BY MOUTH ATBEDTIME AS NEEDED FOR MUSCLE SPASMS. 30 tablet 1  . escitalopram (LEXAPRO) 20 MG tablet Take 1 tablet by mouth  daily 30 tablet 5  . gabapentin (NEURONTIN) 300 MG capsule TAKE 2 CAPSULES BY MOUTH 3 TIMES A DAY 180 capsule 3  . hydrOXYzine (ATARAX/VISTARIL) 25 MG tablet TAKE 1 TABLET BY MOUTH TWICE A DAY 60 tablet  3  . Insulin Pen Needle 32G X 4 MM MISC Use for insulin injection twice a day 250.00 100 each 2  . Krill Oil CAPS MegaRed caps daily    . losartan (COZAAR) 100 MG tablet TAKE 1 TABLET BY MOUTH ONCE A DAY 30 tablet 3  . metFORMIN (GLUCOPHAGE) 500 MG tablet TAKE 2 TABLETS BY MOUTH TWICE DAILY WITH MEALS 120 tablet 3  . Multiple Vitamin (MULTIVITAMIN) tablet Take 1 tablet by mouth daily.    Marland Kitchen zoster vaccine live, PF, (ZOSTAVAX) 82956 UNT/0.65ML injection Inject 19,400 Units into the skin once. 1 each 0  . hydrochlorothiazide (HYDRODIURIL) 25 MG tablet TAKE 1 TABLET BY MOUTH ONCE A DAY 30 tablet 6  . Insulin Lispro Prot & Lispro (HUMALOG 75/25 MIX) (75-25) 100 UNIT/ML Kwikpen Take 16 units in the morning and 24 units in the evening. (Patient taking differently: Take 12 units in the morning and 20 units in the evening.) 150 mL 1  . triamterene-hydrochlorothiazide (MAXZIDE-25) 37.5-25 MG tablet TAKE 1 TABLET BY MOUTH  DAILY. (Patient not taking: Reported on 06/30/2015) 30 tablet 6   No facility-administered medications prior to visit.    Allergies  Allergen Reactions  . Penicillins Swelling    Review of Systems  Constitutional: Positive for malaise/fatigue. Negative for fever and chills.  HENT: Negative for congestion and hearing loss.   Eyes: Negative for discharge.  Respiratory: Negative for cough, sputum production and shortness of breath.   Cardiovascular: Negative for chest pain, palpitations and leg swelling.  Gastrointestinal: Negative for heartburn, nausea, vomiting, abdominal pain, diarrhea, constipation and blood in stool.  Genitourinary: Negative for dysuria, urgency, frequency and hematuria.  Musculoskeletal: Negative for myalgias, back pain and falls.  Skin: Negative for rash.  Neurological: Negative for dizziness, sensory change, loss of consciousness, weakness and headaches.  Endo/Heme/Allergies: Negative for environmental allergies. Does not bruise/bleed easily.  Psychiatric/Behavioral: Negative for depression and suicidal ideas. The patient has insomnia. The patient is not nervous/anxious.        Objective:    Physical Exam  Constitutional: She is oriented to person, place, and time. She appears well-developed and well-nourished. No distress.  HENT:  Head: Normocephalic and atraumatic.  Eyes: Conjunctivae are normal.  Neck: Neck supple. No thyromegaly present.  Cardiovascular: Normal rate, regular rhythm and normal heart sounds.   No murmur heard. Pulmonary/Chest: Effort normal and breath sounds normal. No respiratory distress.  Abdominal: Soft. Bowel sounds are normal. She exhibits no distension and no mass. There is no tenderness.  Musculoskeletal: She exhibits no edema.  Lymphadenopathy:    She has no cervical adenopathy.  Neurological: She is alert and oriented to person, place, and time.  Skin: Skin is warm and dry.  Psychiatric: She has a normal mood and affect. Her  behavior is normal.    BP 132/78 mmHg  Pulse 84  Temp(Src) 98.2 F (36.8 C) (Oral)  Ht 5\' 2"  (1.575 m)  Wt 185 lb 8 oz (84.142 kg)  BMI 33.92 kg/m2  SpO2 100% Wt Readings from Last 3 Encounters:  06/30/15 185 lb 8 oz (84.142 kg)  06/02/15 186 lb 6.4 oz (84.55 kg)  03/31/15 187 lb 4 oz (84.936 kg)     Lab Results  Component Value Date   WBC 10.4 06/27/2015   HGB 10.7* 06/27/2015   HCT 33.4* 06/27/2015   PLT 658.0* 06/27/2015   GLUCOSE 134* 06/27/2015   CHOL 210* 06/27/2015   TRIG 153.0* 06/27/2015   HDL 45.00 06/27/2015   LDLDIRECT 97.0 02/17/2015  LDLCALC 134* 06/27/2015   ALT 14 06/27/2015   AST 14 06/27/2015   NA 141 06/27/2015   K 3.9 06/27/2015   CL 102 06/27/2015   CREATININE 1.32* 06/27/2015   BUN 24* 06/27/2015   CO2 29 06/27/2015   TSH 2.99 06/27/2015   HGBA1C 8.5* 06/27/2015   MICROALBUR 1.1 06/27/2015    Lab Results  Component Value Date   TSH 2.99 06/27/2015   Lab Results  Component Value Date   WBC 10.4 06/27/2015   HGB 10.7* 06/27/2015   HCT 33.4* 06/27/2015   MCV 86.4 06/27/2015   PLT 658.0* 06/27/2015   Lab Results  Component Value Date   NA 141 06/27/2015   K 3.9 06/27/2015   CO2 29 06/27/2015   GLUCOSE 134* 06/27/2015   BUN 24* 06/27/2015   CREATININE 1.32* 06/27/2015   BILITOT 0.6 06/27/2015   ALKPHOS 62 06/27/2015   AST 14 06/27/2015   ALT 14 06/27/2015   PROT 7.3 06/27/2015   ALBUMIN 4.1 06/27/2015   CALCIUM 9.7 06/27/2015   GFR 51.25* 06/27/2015   Lab Results  Component Value Date   CHOL 210* 06/27/2015   Lab Results  Component Value Date   HDL 45.00 06/27/2015   Lab Results  Component Value Date   LDLCALC 134* 06/27/2015   Lab Results  Component Value Date   TRIG 153.0* 06/27/2015   Lab Results  Component Value Date   CHOLHDL 5 06/27/2015   Lab Results  Component Value Date   HGBA1C 8.5* 06/27/2015       Assessment & Plan:   Problem List Items Addressed This Visit    Anemia    Increase leafy  greens, consider increased lean red meat and using cast iron cookware. Continue to monitor, report any concerns. Has agreed to referral to gastroenterology at beginning of 2017. Referral inplace      Diabetes mellitus ([place])    hgba1c trending up, increase insulin, referred to endocrinology and minimize simple carbs. Increase exercise as tolerated. Continue current meds      Relevant Medications   Insulin Lispro Prot & Lispro (HUMALOG 75/25 MIX) (75-25) 100 UNIT/ML Kwikpen   Other Relevant Orders   Ambulatory referral to Endocrinology   Hemoglobin A1c   Hyperlipidemia    Encouraged heart healthy diet, increase exercise, avoid trans fats, consider a krill oil cap daily. Encouraged to start Atorvastatin 10 mg      Hypertension    Well controlled, no changes to meds. Encouraged heart healthy diet such as the DASH diet and exercise as tolerated.       Insomnia    Encouraged good sleep hygiene such as dark, quiet room. No blue/green glowing lights such as computer screens in bedroom. No alcohol or stimulants in evening. Cut down on caffeine as able. Regular exercise is helpful but not just prior to bed time.       Obesity    Encouraged DASH diet, decrease po intake and increase exercise as tolerated. Needs 7-8 hours of sleep nightly. Avoid trans fats, eat small, frequent meals every 4-5 hours with lean proteins, complex carbs and healthy fats. Minimize simple carbs      Relevant Medications   Insulin Lispro Prot & Lispro (HUMALOG 75/25 MIX) (75-25) 100 UNIT/ML Kwikpen   Osteopenia    . Recommend calcium intake of 1200 to 1500 mg daily, divided into roughly 3 doses. Best source is the diet and a single dairy serving is about 500 mg, a supplement of calcium citrate once  or twice daily to balance diet is fine if not getting enough in diet. Also need Vitamin D 2000 IU caps, 1 cap daily if not already taking vitamin D. Also recommend weight baring exercise on hips and upper body to keep bones  strong      Preventative health care - Primary    Patient encouraged to maintain heart healthy diet, regular exercise, adequate sleep. Consider daily probiotics. Take medications as prescribed. Labs reviewed. Referred to gastroenterology for colonoscopy but she asks that it wait til January of 2017. Hep C is negative      Thrombocytosis ([facility])    resolved       Other Visit Diagnoses    Colon cancer screening        Relevant Orders    Ambulatory referral to Gastroenterology    Screening for viral disease        Relevant Orders    Hepatitis C Antibody    Benign essential HTN        Relevant Orders    TSH    CBC    Lipid panel    Comprehensive metabolic panel       I have discontinued Ms. Alessio's hydrochlorothiazide. I have also changed her Insulin Lispro Prot & Lispro. Additionally, I am having her maintain her aspirin, Krill Oil, Insulin Pen Needle, multivitamin, losartan, metFORMIN, zoster vaccine live (PF), hydrOXYzine, escitalopram, cyclobenzaprine, triamterene-hydrochlorothiazide, gabapentin, and b complex vitamins.  Meds ordered this encounter  Medications  . Insulin Lispro Prot & Lispro (HUMALOG 75/25 MIX) (75-25) 100 UNIT/ML Kwikpen    Sig: Take 16 units in the morning and 26 units in the evening.    Dispense:  150 mL    Refill:  1    DISCONTINUE ALL PREVIOUS REFILLS FOR THIS MEDICATION     Penni Homans, MD

## 2015-07-02 NOTE — Assessment & Plan Note (Signed)
Well controlled, no changes to meds. Encouraged heart healthy diet such as the DASH diet and exercise as tolerated.  °

## 2015-07-02 NOTE — Assessment & Plan Note (Signed)
resolved 

## 2015-07-02 NOTE — Assessment & Plan Note (Signed)
Recommend calcium intake of 1200 to 1500 mg daily, divided into roughly 3 doses. Best source is the diet and a single dairy serving is about 500 mg, a supplement of calcium citrate once or twice daily to balance diet is fine if not getting enough in diet. Also need Vitamin D 2000 IU caps, 1 cap daily if not already taking vitamin D. Also recommend weight baring exercise on hips and upper body to keep bones strong 

## 2015-07-02 NOTE — Assessment & Plan Note (Addendum)
Patient encouraged to maintain heart healthy diet, regular exercise, adequate sleep. Consider daily probiotics. Take medications as prescribed. Labs reviewed. Referred to gastroenterology for colonoscopy but she asks that it wait til January of 2017. Hep C is negative

## 2015-07-02 NOTE — Assessment & Plan Note (Signed)
hgba1c trending up, increase insulin, referred to endocrinology and minimize simple carbs. Increase exercise as tolerated. Continue current meds

## 2015-07-02 NOTE — Assessment & Plan Note (Signed)
Encouraged DASH diet, decrease po intake and increase exercise as tolerated. Needs 7-8 hours of sleep nightly. Avoid trans fats, eat small, frequent meals every 4-5 hours with lean proteins, complex carbs and healthy fats. Minimize simple carbs 

## 2015-07-03 ENCOUNTER — Encounter: Payer: Self-pay | Admitting: Internal Medicine

## 2015-07-03 ENCOUNTER — Telehealth: Payer: Self-pay | Admitting: Family Medicine

## 2015-07-03 MED ORDER — ATORVASTATIN CALCIUM 10 MG PO TABS: 10.0000 mg | ORAL_TABLET | Freq: Every day | ORAL | Status: DC

## 2015-07-03 NOTE — Telephone Encounter (Signed)
-----   Message from Mosie Lukes, MD sent at 07/02/2015  3:46 PM EST ----- After reviewing her chart I believe she should start a statin, her sugar and cholesterol are both up. I have referred to endocrinology but she should also start Atorvastatin 10 mg po qhs disp #30 with 3 rf if she is willing.

## 2015-07-03 NOTE — Telephone Encounter (Signed)
Patient was informed and ok with starting Atorvastatin.  Sent it in to [facility]

## 2015-07-10 ENCOUNTER — Encounter: Payer: Self-pay | Admitting: Endocrinology

## 2015-07-10 ENCOUNTER — Ambulatory Visit (INDEPENDENT_AMBULATORY_CARE_PROVIDER_SITE_OTHER): Payer: MEDICARE | Admitting: Endocrinology

## 2015-07-10 ENCOUNTER — Other Ambulatory Visit: Payer: Self-pay | Admitting: Family Medicine

## 2015-07-10 VITALS — BP 164/96 | HR 84 | Temp 98.8°F | Resp 14 | Ht 62.0 in | Wt 188.0 lb

## 2015-07-10 DIAGNOSIS — E1165 Type 2 diabetes mellitus with hyperglycemia: Secondary | ICD-10-CM

## 2015-07-10 DIAGNOSIS — Z794 Long term (current) use of insulin: Secondary | ICD-10-CM | POA: Diagnosis not present

## 2015-07-10 NOTE — Patient Instructions (Signed)
Check blood sugars on waking up 3-4  times a week Also check blood sugars about 2 hours after a meal and do this after different meals by rotation  Recommended blood sugar levels on waking up is 90-130 and about 2 hours after meal is 130-160  Please bring your blood sugar monitor to each visit, thank you  Take pm shot  Before supper

## 2015-07-10 NOTE — Progress Notes (Signed)
Patient ID: Beth Maldonado, female   DOB: June 18, 1946, 69 y.o.   MRN: [id number]           Reason for Appointment: Consultation for Type 2 Diabetes  History of Present Illness:          Date of diagnosis of type 2 diabetes mellitus: 1998        Background history:   She was initially started on metformin for treatment but records are not available about her initial management Several years ago she was started on insulin and most likely has been on a premixed insulin twice a day She does not think she has tried injections like Byetta or Victoza and does not remember any other oral agents she has tried with insulin Her blood sugar control has been inadequate with A1c generally around 7.5-8.5 range since at least 2013 and highest level of 9.2  Recent history:   She is now referred here for further management because of consistently poor control and recent A1c of 8.5  INSULIN regimen is:   Humalog Mix, 16units in the morning--2 units at 8 PM   Current blood sugar patterns and problems identified:    She did not bring her monitor and not clear how often she is checking her blood sugar  She thinks his blood sugars in the mornings are quite variable  She has occasional low blood sugars overnight although none recently  She usually does not check her blood sugars during the day but sometimes late in the evenings, does not think they are significantly high  More recently because of the cost of insulin she has been taking less than the prescribed dose  She may sometimes have snacks in the evenings which may not be healthy.  She thinks her blood sugars tend to go up with stress which she is having more of  Non-insulin hypoglycemic drugs the patient is taking are:  metformin 1 g twice a day      Side effects from medications have been:None  Compliance with the medical regimen: Poor  Hypoglycemia:  Occasionally at 3 AM  Glucose monitoring:  done  times a day         Glucometer: One  Touch Ultra.      Blood Glucose readings by time of day and averages from meter download:  PREMEAL Breakfast Lunch Dinner hs  Overall   Glucose range: 69-324 ?   150-180   Median:         Self-care: The diet that the patient has been following is: tries to limit fast food.     Meal times are:  Breakfast is at 7 am Lunch: 2 pm Dinner: 6-7 pm She is usually trying to eat healthy meals, and the evening has a lighter supper and may drink a smoothie but later has snacks, some of them not healthy                Dietician visit, most recent: at diagnosis                Exercise: very little, Limited by knee and back pain  Weight history: 163-190  Wt Readings from Last 3 Encounters:  07/10/15 188 lb (85.276 kg)  06/30/15 185 lb 8 oz (84.142 kg)  06/02/15 186 lb 6.4 oz (84.55 kg)    Glycemic control:   Lab Results  Component Value Date   HGBA1C 8.5* 06/27/2015   HGBA1C 7.9* 02/17/2015   HGBA1C 8.3* 09/27/2014   Lab Results  Component Value Date   MICROALBUR 1.1 06/27/2015   LDLCALC 134* 06/27/2015   CREATININE 1.32* 06/27/2015         Medication List       This list is accurate as of: 07/10/15  1:16 PM.  Always use your most recent med list.               aspirin 81 MG tablet  Take 81 mg by mouth daily.     atorvastatin 10 MG tablet  Commonly known as:  LIPITOR  Take 1 tablet (10 mg total) by mouth daily.     b complex vitamins tablet  Take 1 tablet by mouth daily.     cyclobenzaprine 5 MG tablet  Commonly known as:  FLEXERIL  TAKE 1 TABLET BY MOUTH ATBEDTIME AS NEEDED FOR MUSCLE SPASMS.     escitalopram 20 MG tablet  Commonly known as:  LEXAPRO  Take 1 tablet by mouth  daily     gabapentin 300 MG capsule  Commonly known as:  NEURONTIN  TAKE 2 CAPSULES BY MOUTH 3 TIMES A DAY     hydrOXYzine 25 MG tablet  Commonly known as:  ATARAX/VISTARIL  TAKE 1 TABLET BY MOUTH TWICE A DAY     Insulin Lispro Prot & Lispro (75-25) 100 UNIT/ML Kwikpen  Commonly  known as:  HUMALOG 75/25 MIX  Take 16 units in the morning and 26 units in the evening.     Insulin Pen Needle 32G X 4 MM Misc  Use for insulin injection twice a day 250.00     Krill Oil Caps  MegaRed caps daily     losartan 100 MG tablet  Commonly known as:  COZAAR  TAKE 1 TABLET BY MOUTH ONCE A DAY     metFORMIN 500 MG tablet  Commonly known as:  GLUCOPHAGE  TAKE 2 TABLETS BY MOUTH TWICE DAILY WITH MEALS     multivitamin tablet  Take 1 tablet by mouth daily.     triamterene-hydrochlorothiazide 37.5-25 MG tablet  Commonly known as:  MAXZIDE-25  TAKE 1 TABLET BY MOUTH DAILY.     zoster vaccine live (PF) 19400 UNT/0.65ML injection  Commonly known as:  ZOSTAVAX  Inject 19,400 Units into the skin once.        Allergies:  Allergies  Allergen Reactions  . Penicillins Swelling    Past Medical History  Diagnosis Date  . Osteopenia   . Diabetes mellitus   . Hypertension   . Acute bronchitis 03/29/2013  . Depression 06/27/2013  . Allergy   . Arthritis   . Depression with anxiety 06/27/2013  . Medicare annual wellness visit, subsequent 03/23/2014  . Neck pain on left side 05/29/2014  . Acute upper respiratory infection 11/27/2014  . Insomnia 06/30/2015  . Anemia 07/02/2015  . Preventative health care 07/02/2015    Past Surgical History  Procedure Laterality Date  . Cataract extraction, bilateral    . Laparoscopy      rt knee  . Breast surgery      Reduction  . Vaginal hysterectomy      partial  . Tubal ligation    . Hemorrhoid surgery      Family History  Problem Relation Age of Onset  . Diabetes Mother   . Heart attack Father   . Heart disease Father     26 and 21 MI  . Diabetes Sister   . Sickle cell anemia Sister   . Arthritis Sister   . Hypertension Sister   .  Diabetes Sister   . Thyroid disease Sister   . Arthritis Sister   . Glaucoma Sister     Social History:  reports that she quit smoking about 28 years ago. Her smoking use included Cigarettes.  She started smoking about 48 years ago. She has a 20 pack-year smoking history. She has never used smokeless tobacco. She reports that she drinks alcohol. She reports that she does not use illicit drugs.    Review of Systems    Lipid history: Has had variable lipid levels in the past, recently started on Lipitor 10 mg by PCP for LDL of 134, not due for follow-up.    Lab Results  Component Value Date   CHOL 210* 06/27/2015   HDL 45.00 06/27/2015   LDLCALC 134* 06/27/2015   LDLDIRECT 97.0 02/17/2015   TRIG 153.0* 06/27/2015   CHOLHDL 5 06/27/2015           Hypertension:long standing and followed by PCP  Most recent eye exam was 10/16, reportedly normal  Most recent foot exam: 12/16  THYROID: She thinks she had seen a thyroid specialist before but no reports of ultrasounds available in the system Does have a family history of thyroid disease treated surgically  Lab Results  Component Value Date   TSH 2.99 06/27/2015     Review of Systems  Constitutional: Negative for weight gain and malaise.  HENT: Negative for trouble swallowing.   Eyes: Negative for blurred vision.  Respiratory: Negative for shortness of breath.   Cardiovascular: Negative for leg swelling.  Gastrointestinal: Negative for diarrhea.  Endocrine: Negative for fatigue.  Genitourinary: Positive for nocturia.       1-2x at night  Musculoskeletal: Positive for joint pain and back pain.  Skin: Negative for rash and itching.  Neurological: Positive for numbness, tingling and balance difficulty.       Sharp pains and lower legs, improved with gabapentin 600 mg 3 times a day May feel of balance in the mornings on getting up but otherwise not  Psychiatric/Behavioral: Positive for nervousness.       Has had more stress      LABS:  No visits with results within 1 Week(s) from this visit. Latest known visit with results is:  Lab on 06/27/2015  Component Date Value Ref Range Status  . WBC 06/27/2015 10.4   4.0 - 10.5 K/uL Final  . RBC 06/27/2015 3.86* 3.87 - 5.11 Mil/uL Final  . Platelets 06/27/2015 658.0* 150.0 - 400.0 K/uL Final  . Hemoglobin 06/27/2015 10.7* 12.0 - 15.0 g/dL Final  . HCT 06/27/2015 33.4* 36.0 - 46.0 % Final  . MCV 06/27/2015 86.4  78.0 - 100.0 fl Final  . MCHC 06/27/2015 32.2  30.0 - 36.0 g/dL Final  . RDW 06/27/2015 15.3  11.5 - 15.5 % Final  . TSH 06/27/2015 2.99  0.35 - 4.50 uIU/mL Final  . Cholesterol 06/27/2015 210* 0 - 200 mg/dL Final   ATP III Classification       Desirable:  < 200 mg/dL               Borderline High:  200 - 239 mg/dL          High:  > = 240 mg/dL  . Triglycerides 06/27/2015 153.0* 0.0 - 149.0 mg/dL Final   Normal:  <150 mg/dLBorderline High:  150 - 199 mg/dL  . HDL 06/27/2015 45.00  >39.00 mg/dL Final  . VLDL 06/27/2015 30.6  0.0 - 40.0 mg/dL Final  . LDL Cholesterol 06/27/2015  134* 0 - 99 mg/dL Final  . Total CHOL/HDL Ratio 06/27/2015 5   Final                  Men          Women1/2 Average Risk     3.4          3.3Average Risk          5.0          4.42X Average Risk          9.6          7.13X Average Risk          15.0          11.0                      . NonHDL 06/27/2015 164.64   Final   NOTE:  Non-HDL goal should be 30 mg/dL higher than patient's LDL goal (i.e. LDL goal of < 70 mg/dL, would have non-HDL goal of < 100 mg/dL)  . Hgb A1c MFr Bld 06/27/2015 8.5* 4.6 - 6.5 % Final   Glycemic Control Guidelines for People with Diabetes:Non Diabetic:  <6%Goal of Therapy: <7%Additional Action Suggested:  >8%   . Sodium 06/27/2015 141  135 - 145 mEq/L Final  . Potassium 06/27/2015 3.9  3.5 - 5.1 mEq/L Final  . Chloride 06/27/2015 102  96 - 112 mEq/L Final  . CO2 06/27/2015 29  19 - 32 mEq/L Final  . Glucose, Bld 06/27/2015 134* 70 - 99 mg/dL Final  . BUN 06/27/2015 24* 6 - 23 mg/dL Final  . Creatinine, Ser 06/27/2015 1.32* 0.40 - 1.20 mg/dL Final  . Total Bilirubin 06/27/2015 0.6  0.2 - 1.2 mg/dL Final  . Alkaline Phosphatase 06/27/2015 62  39 -  117 U/L Final  . AST 06/27/2015 14  0 - 37 U/L Final  . ALT 06/27/2015 14  0 - 35 U/L Final  . Total Protein 06/27/2015 7.3  6.0 - 8.3 g/dL Final  . Albumin 06/27/2015 4.1  3.5 - 5.2 g/dL Final  . Calcium 06/27/2015 9.7  8.4 - 10.5 mg/dL Final  . GFR 06/27/2015 51.25* >60.00 mL/min Final  . Microalb, Ur 06/27/2015 1.1  0.0 - 1.9 mg/dL Final  . Creatinine,U 06/27/2015 100.9   Final  . Microalb Creat Ratio 06/27/2015 1.1  0.0 - 30.0 mg/g Final    Physical Examination:  BP 164/96 mmHg  Pulse 84  Temp(Src) 98.8 F (37.1 C)  Resp 14  Ht 5\' 2"  (1.575 m)  Wt 188 lb (85.276 kg)  BMI 34.38 kg/m2  SpO2 95%  GENERAL:         Patient has generalized obesity.   HEENT:         Eye exam shows normal external appearance. Fundus exam shows no retinopathy. Oral exam shows normal mucosa .  NECK:   There is no lymphadenopathy Thyroid is just palpable on swallowing on the right side, it is at least 2-1/2 times normal on the left side, firming nodular investment on swallowing Carotids are normal to palpation and no bruit heard LUNGS:         Chest is symmetrical. Lungs are clear to auscultation.Marland Kitchen   HEART:         Heart sounds:  S1 and S2 are normal. No murmur or click heard., no S3 or S4.   ABDOMEN:   There is no distention present. Liver and spleen are not  palpable. No other mass or tenderness present.   NEUROLOGICAL:   Vibration sense is markedly reduced on the right and moderately on the left, monofilament sensation normal  Ankle jerks are absent bilaterally.           MUSCULOSKELETAL:  There is no swelling or deformity of the peripheral joints. Spine is normal to inspection.   EXTREMITIES:     There is no edema. No skin lesions present.Marland Kitchen SKIN:       No rash or lesions of concern.        ASSESSMENT:  Diabetes type 2, uncontrolled   She has had persistently poor control with obesity Currently on premixed insulin twice a day She is taking her evening insulin  inappropriately close to bedtime  instead of before evening meal Does not know what her blood sugars are throughout the day and mostly checking readings in the mornings which are variable Variability in blood sugars is partly because of using premixed insulin as well as inconsistent diet, stress and timing of insulin.  Although ideally she should been on basal bolus insulin regimen with mealtime injections and basal insulin she is reluctant to take 4 injections a day Discussed and showed the patient the V-go pump and how this would help control her diabetes.  She appears to be interested in doing this instead of injectable insulin especially with her difficulty affording brand name insulin of any kind  Complications: No significant complications detected but has symptoms of neuropathy, currently relieved by gabapentin   HYPERTENSION: Long-standing, Followed by PCP, blood sugar sugars higher today but usually well-controlled  HYPERLIPIDEMIA: Has just started treatment with Lipitor   MULTINODULAR goiter: Not clear if this has been evaluated, she will check her previous records and let us know, no reports of thyroid ultrasound available in the record  PLAN:    She will start using the V-go pump once this is approved and will start with a 20 unit basal  She will also need to have 6 units of insulin to cover breakfast and lunch and 4 units at dinnertime  Start checking blood sugars at various times of the day including after meals rather than mostly in the morning, discussed blood sugar targets at various times.  She will bring her monitor for download on the next visit and consider upgrading her monitor if relatively older  Consultation with nurse educator both before starting the insulin pump as well as for general diabetes education including meal planning  Chair exercises given today  She will follow-up of week after starting the V-go pump  Continue metformin  Consider adding Invokana if blood sugar control is difficult  but.if she can afford this  Patient Instructions  Check blood sugars on waking up 3-4  times a week Also check blood sugars about 2 hours after a meal and do this after different meals by rotation  Recommended blood sugar levels on waking up is 90-130 and about 2 hours after meal is 130-160  Please bring your blood sugar monitor to each visit, thank you  Take pm shot  Before supper     Counseling time on subjects discussed above is over 50% of today's 60 minute visit   Jaun Galluzzo 07/10/2015, 1:16 PM   Note: This office note was prepared with Estate agent. Any transcriptional errors that result from this process are unintentional.

## 2015-07-12 ENCOUNTER — Encounter: Payer: MEDICARE | Attending: Endocrinology | Admitting: Nutrition

## 2015-07-12 ENCOUNTER — Other Ambulatory Visit: Payer: Self-pay | Admitting: *Deleted

## 2015-07-12 ENCOUNTER — Encounter: Payer: MEDICARE | Admitting: Nutrition

## 2015-07-12 DIAGNOSIS — Z713 Dietary counseling and surveillance: Secondary | ICD-10-CM | POA: Diagnosis not present

## 2015-07-12 DIAGNOSIS — IMO0002 Reserved for concepts with insufficient information to code with codable children: Secondary | ICD-10-CM

## 2015-07-12 DIAGNOSIS — E1165 Type 2 diabetes mellitus with hyperglycemia: Secondary | ICD-10-CM

## 2015-07-12 DIAGNOSIS — Z794 Long term (current) use of insulin: Secondary | ICD-10-CM

## 2015-07-12 DIAGNOSIS — E118 Type 2 diabetes mellitus with unspecified complications: Secondary | ICD-10-CM

## 2015-07-12 MED ORDER — GLUCOSE BLOOD VI STRP: ORAL_STRIP | Status: DC

## 2015-07-12 MED ORDER — ONETOUCH DELICA LANCETS 33G MISC: Status: DC

## 2015-07-12 NOTE — Progress Notes (Signed)
Patient is taking insulin as directed:  16u in AM, and 26u acS. Typical day: 5-7AM:  Up FBSs: 200s 170-240 Takes insulin at 7:30AM  Eats oatmeal with dried cranberries, and  12PM: salad with protein and 10 crackers and water to drink 8PM: shake of protein powder (1 scoop) blueberries., and banana 10PM Asleep MN: up and awake for 3-4 hours:  Eats, olives, pickles, popcorn.    Exercise: none Showed her the V-go and discussed how it works.  We discussed the advantages of taking the insulin when eating/snacking, and she is very interested in doing this.   She has not heard from them, and was given the telephone number to call to inquire about insurance coverage for this.  She agreed to call me when she hears from them.  Discussed the idea of balanced meals and limiting fats in her diet, like salad dressings, snacking on olives, and peanut butter, and gave suggestions for lower calories/sodium snacks during the night.

## 2015-07-18 ENCOUNTER — Telehealth: Payer: Self-pay | Admitting: Endocrinology

## 2015-07-18 NOTE — Telephone Encounter (Signed)
Pt needs [place] to call in vgo rx to [place] please

## 2015-07-19 ENCOUNTER — Other Ambulatory Visit: Payer: Self-pay | Admitting: *Deleted

## 2015-07-19 MED ORDER — V-GO 20 KIT: PACK | Status: DC

## 2015-07-19 NOTE — Telephone Encounter (Signed)
Patient is aware and will call back to reschedule her appointment for next week, rx sent.

## 2015-07-19 NOTE — Telephone Encounter (Signed)
What size V-go do you want sent in?

## 2015-07-19 NOTE — Telephone Encounter (Signed)
20 unit.  If she is going to start now would like to see her back in a week rather than on Friday

## 2015-07-21 ENCOUNTER — Ambulatory Visit: Payer: MEDICARE | Admitting: Endocrinology

## 2015-07-27 ENCOUNTER — Ambulatory Visit: Payer: MEDICARE | Admitting: Endocrinology

## 2015-07-30 HISTORY — PX: COLONOSCOPY: SHX174

## 2015-08-01 ENCOUNTER — Encounter: Payer: MEDICARE | Attending: Endocrinology | Admitting: Nutrition

## 2015-08-01 DIAGNOSIS — Z713 Dietary counseling and surveillance: Secondary | ICD-10-CM | POA: Insufficient documentation

## 2015-08-01 DIAGNOSIS — E1165 Type 2 diabetes mellitus with hyperglycemia: Secondary | ICD-10-CM

## 2015-08-02 ENCOUNTER — Other Ambulatory Visit: Payer: Self-pay | Admitting: *Deleted

## 2015-08-02 MED ORDER — GLUCOSE BLOOD VI STRP: ORAL_STRIP | Status: DC

## 2015-08-02 NOTE — Progress Notes (Signed)
   Expand All Collapse All   This patient was instructed on how to fill, apply and use the V-go 20 Written instructions were given for 3 clicks before breakfast, 3 before lunch, and 2 before supper. She reported good understanding of this. She filled a V-go 20 with Novolog insulin with little assistance from me. She will not attach it until tomorrow morning, because she took her Lantus this AM.  She re verbalized the need to stop the Lantus insulin, and take no more.  She was told to fill and attach a new V-go every morning, despite the fact that there may still be some insulin in the V-go. She reported good understanding of this. She was told to test her blood sugars before meals and at bedtime. She was given a sheet to record her blood sugars, and told to call if blood sugars drop before next week. She had no final questions.

## 2015-08-02 NOTE — Patient Instructions (Addendum)
Fill and apply a new V-go every morning Test blood sugars before meals and at bedtime. Call  Blood sugars in on Friday Take 3 clicks (6u) before breakfast and lunch, and 2 clicks (4u) before supper Call if blood sugars drop low

## 2015-08-03 ENCOUNTER — Telehealth: Payer: Self-pay | Admitting: Endocrinology

## 2015-08-03 ENCOUNTER — Ambulatory Visit: Payer: MEDICARE | Admitting: Endocrinology

## 2015-08-03 NOTE — Telephone Encounter (Signed)
Patient stated that she feel like she is not getting enough insulin, her b/s are in 200 hundreds, please advise

## 2015-08-03 NOTE — Telephone Encounter (Signed)
No answer on call back 

## 2015-08-07 ENCOUNTER — Other Ambulatory Visit: Payer: Self-pay | Admitting: Family Medicine

## 2015-08-08 ENCOUNTER — Ambulatory Visit: Payer: MEDICARE | Admitting: Endocrinology

## 2015-08-14 ENCOUNTER — Encounter: Payer: MEDICARE | Admitting: Nutrition

## 2015-08-14 DIAGNOSIS — E118 Type 2 diabetes mellitus with unspecified complications: Principal | ICD-10-CM

## 2015-08-14 DIAGNOSIS — E1165 Type 2 diabetes mellitus with hyperglycemia: Secondary | ICD-10-CM

## 2015-08-14 DIAGNOSIS — Z713 Dietary counseling and surveillance: Secondary | ICD-10-CM | POA: Diagnosis not present

## 2015-08-14 NOTE — Patient Instructions (Signed)
1.  Reduce PM dose of 75/25 from 26 to 24.  If more low blood sugars during the night, or early AM, then reduce the dose 2 more units.   2.  Test blood sugars acB and acS for 1 week and call me the results next Monday. 3.  Continue to exercise 5 days /wk, and work up to 30-40 min.

## 2015-08-14 NOTE — Progress Notes (Signed)
Patient is here because she wanted to notify me that she is not going to use the V-go.  She said that cost is too much, and she can not afford this.  She has been back on injections of 75/25  16uAM, and 26u PM She reports that she is following a "better diet" of more protein and less carbs and that her blood sugars are "looking much better".  FBSs: 62, 112, 148 today (she dropped to 59 last night at 1AM).   She is exercising after supper now on a small trampolline for 30 min.   Meter shows that blood sugars before supper was 85, and 103 for last 2 days.   Plan: 1.  Reduce PM dose of 75/25 from 26 to 24.  If more low blood sugars during the night, or early AM, then reduce the dose 2 more units.   2.  Test blood sugars acB and acS for 1 week and call me the results.

## 2015-09-06 ENCOUNTER — Encounter: Payer: MEDICARE | Admitting: Internal Medicine

## 2015-09-07 ENCOUNTER — Encounter: Payer: Self-pay | Admitting: Endocrinology

## 2015-09-07 ENCOUNTER — Ambulatory Visit (INDEPENDENT_AMBULATORY_CARE_PROVIDER_SITE_OTHER): Payer: MEDICARE | Admitting: Endocrinology

## 2015-09-07 VITALS — BP 148/84 | HR 104 | Temp 98.0°F | Ht 62.0 in | Wt 185.0 lb

## 2015-09-07 DIAGNOSIS — Z794 Long term (current) use of insulin: Secondary | ICD-10-CM

## 2015-09-07 DIAGNOSIS — E1165 Type 2 diabetes mellitus with hyperglycemia: Secondary | ICD-10-CM

## 2015-09-07 MED ORDER — INSULIN GLARGINE 300 UNIT/ML ~~LOC~~ SOPN: 22.0000 [IU] | PEN_INJECTOR | Freq: Every day | SUBCUTANEOUS | Status: DC

## 2015-09-07 MED ORDER — INSULIN LISPRO 100 UNIT/ML (KWIKPEN): PEN_INJECTOR | SUBCUTANEOUS | Status: DC

## 2015-09-07 NOTE — Patient Instructions (Signed)
Toujeo insulin: This insulin provides blood sugar control for up to 24 hours.  Start with 22 units at bedtime daily and increase by 2 units every 3 days until the waking up sugars are under 130. Then continue the same dose. If blood sugar is under 90 for 2 days in a row, reduce the dose by 2 units. Note that this insulin does not control the rise of blood sugar with meals    Humalog 5--7--5 before the meal  Check blood sugars on waking up   times a week Also check blood sugars about 2 hours after a meal and do this after different meals by rotation  Recommended blood sugar levels on waking up is 90-130 and about 2 hours after meal is 130-160  Please bring your blood sugar monitor to each visit, thank you

## 2015-09-07 NOTE — Progress Notes (Signed)
Patient ID: Beth Maldonado, female   DOB: 11-21-1945, 70 y.o.   MRN: [id number]           Reason for Appointment: Consultation for Type 2 Diabetes  History of Present Illness:          Date of diagnosis of type 2 diabetes mellitus: 1998        Background history:   She was initially started on metformin for treatment but records are not available about her initial management Several years ago she was started on insulin and most likely has been on a premixed insulin twice a day She does not think she has tried injections like Byetta or Victoza and does not remember any other oral agents she has tried with insulin Her blood sugar control has been inadequate with A1c generally around 7.5-8.5 range since at least 2013 and highest level of 9.2  Recent history:   She is now referred here for further management because of consistently poor control and recent A1c of 8.5  INSULIN regimen is:   Humalog Mix, 16 units in the morning--24 units at suppertime    Current blood sugar patterns and problems identified:  She was given a trial of the V-go pump which she used only the sample and did not continue this because of high  out-of-pocket expense  She did bring her monitor for download and is generally checking blood sugars twice a day but only BEFORE breakfast and supper usually  INSULIN doses: She is adjusting the dose at least at breakfast based on the pre-meal blood sugar  She has some markedly increased blood sugars both morning and evening which she thinks are from stress or eating starches like corn  Lowest blood sugars appear to be midday and early afternoon  FASTING blood sugars are quite variable recently but overall fairly well controlled, evening insulin was reduced by 2 units when she had a glucose of 62 early morning  Non-insulin hypoglycemic drugs the patient is taking are:  metformin 1 g twice a day      Side effects from medications have been:None  Compliance with the  medical regimen: Poor  Hypoglycemia:  Occasionally at 3 AM  Glucose monitoring:  done  times a day         Glucometer: One Touch Ultra.      Blood Glucose readings by time of day and averages from meter download:  Mean values apply above for all meters except median for One Touch  PRE-MEAL Fasting Lunch Dinner Bedtime Overall  Glucose range:  62-289   46-101   120-269     Mean/median:  129    186    142    POST-MEAL PC Breakfast PC Lunch PC Dinner  Glucose range:   92-251   103-400   Mean/median:   132       Self-care: The diet that the patient has been following is: tries to limit fast food.     Meal times are:  Breakfast is at 7 am Lunch: 2 pm Dinner: 6-7 pm She is usually trying to eat healthy meals, and the evening has a lighter supper and may drink a smoothie but later has snacks, some of them not healthy                Dietician visit, most recent: at diagnosis                Exercise: very little, Limited by knee and back pain  Weight  history: 163-190  Wt Readings from Last 3 Encounters:  09/07/15 185 lb (83.915 kg)  07/10/15 188 lb (85.276 kg)  06/30/15 185 lb 8 oz (84.142 kg)    Glycemic control:   Lab Results  Component Value Date   HGBA1C 8.5* 06/27/2015   HGBA1C 7.9* 02/17/2015   HGBA1C 8.3* 09/27/2014   Lab Results  Component Value Date   MICROALBUR 1.1 06/27/2015   LDLCALC 134* 06/27/2015   CREATININE 1.32* 06/27/2015         Medication List       This list is accurate as of: 09/07/15  2:35 PM.  Always use your most recent med list.               aspirin 81 MG tablet  Take 81 mg by mouth daily.     atorvastatin 10 MG tablet  Commonly known as:  LIPITOR  Take 1 tablet (10 mg total) by mouth daily.     b complex vitamins tablet  Take 1 tablet by mouth daily.     cyclobenzaprine 5 MG tablet  Commonly known as:  FLEXERIL  TAKE 1 TABLET BY MOUTH ATBEDTIME AS NEEDED FOR MUSCLE SPASMS.     escitalopram 20 MG tablet  Commonly known as:   LEXAPRO  Take 1 tablet by mouth  daily     gabapentin 300 MG capsule  Commonly known as:  NEURONTIN  TAKE 2 CAPSULES BY MOUTH 3 TIMES A DAY     glucose blood test strip  Commonly known as:  ONETOUCH VERIO  Use as instructed to check blood sugar 4 times per day dx code E11.9     hydrOXYzine 25 MG tablet  Commonly known as:  ATARAX/VISTARIL  TAKE 1 TABLET BY MOUTH TWICE A DAY     Insulin Glargine 300 UNIT/ML Sopn  Commonly known as:  TOUJEO SOLOSTAR  Inject 22 Units into the skin daily.     insulin lispro 100 UNIT/ML KiwkPen  Commonly known as:  HUMALOG KWIKPEN  5 units at breakfast and supper and 7 for lunch     Insulin Pen Needle 32G X 4 MM Misc  Use for insulin injection twice a day 250.00     Krill Oil Caps  MegaRed caps daily     losartan 100 MG tablet  Commonly known as:  COZAAR  TAKE 1 TABLET BY MOUTH ONCE A DAY     metFORMIN 500 MG tablet  Commonly known as:  GLUCOPHAGE  TAKE 2 TABLETS BY MOUTH TWICE DAILY WITH MEALS     multivitamin tablet  Take 1 tablet by mouth daily.     ONETOUCH DELICA LANCETS 72Z Misc  Use as directed to check blood sugar 4 times per day dx code E11.9     triamterene-hydrochlorothiazide 37.5-25 MG tablet  Commonly known as:  MAXZIDE-25  TAKE 1 TABLET BY MOUTH DAILY.     V-GO 20 Kit  Use 1 per day.     zoster vaccine live (PF) 19400 UNT/0.65ML injection  Commonly known as:  ZOSTAVAX  Inject 19,400 Units into the skin once.        Allergies:  Allergies  Allergen Reactions  . Penicillins Swelling    Past Medical History  Diagnosis Date  . Osteopenia   . Diabetes mellitus   . Hypertension   . Acute bronchitis 03/29/2013  . Depression 06/27/2013  . Allergy   . Arthritis   . Depression with anxiety 06/27/2013  . Medicare annual wellness visit, subsequent 03/23/2014  .  Neck pain on left side 05/29/2014  . Acute upper respiratory infection 11/27/2014  . Insomnia 06/30/2015  . Anemia 07/02/2015  . Preventative health care  07/02/2015    Past Surgical History  Procedure Laterality Date  . Cataract extraction, bilateral    . Laparoscopy      rt knee  . Breast surgery      Reduction  . Vaginal hysterectomy      partial  . Tubal ligation    . Hemorrhoid surgery      Family History  Problem Relation Age of Onset  . Diabetes Mother   . Heart attack Father   . Heart disease Father     30 and 57 MI  . Diabetes Sister   . Sickle cell anemia Sister   . Arthritis Sister   . Hypertension Sister   . Diabetes Sister   . Thyroid disease Sister   . Arthritis Sister   . Glaucoma Sister     Social History:  reports that she quit smoking about 28 years ago. Her smoking use included Cigarettes. She started smoking about 48 years ago. She has a 20 pack-year smoking history. She has never used smokeless tobacco. She reports that she drinks alcohol. She reports that she does not use illicit drugs.    Review of Systems    Lipid history: Has had variable lipid levels in the past, recently started on Lipitor 10 mg by PCP for LDL of 134, not due for follow-up.    Lab Results  Component Value Date   CHOL 210* 06/27/2015   HDL 45.00 06/27/2015   LDLCALC 134* 06/27/2015   LDLDIRECT 97.0 02/17/2015   TRIG 153.0* 06/27/2015   CHOLHDL 5 06/27/2015           Hypertension:long standing and followed by PCP  Most recent eye exam was 10/16, reportedly normal  Most recent foot exam: 12/16  THYROID: She thinks she had seen a thyroid specialist before but no reports of ultrasounds available in the system Does have a family history of thyroid disease treated surgically  Lab Results  Component Value Date   TSH 2.99 06/27/2015     Review of Systems  MULTINODULAR goiter: Not clear if this has been evaluated, she will check her previous records and let us know, no reports of thyroid ultrasound available in the record  Taking gabapentin for neuropathy  LABS:  No visits with results within 1 Week(s) from this  visit. Latest known visit with results is:  Lab on 06/27/2015  Component Date Value Ref Range Status  . WBC 06/27/2015 10.4  4.0 - 10.5 K/uL Final  . RBC 06/27/2015 3.86* 3.87 - 5.11 Mil/uL Final  . Platelets 06/27/2015 658.0* 150.0 - 400.0 K/uL Final  . Hemoglobin 06/27/2015 10.7* 12.0 - 15.0 g/dL Final  . HCT 06/27/2015 33.4* 36.0 - 46.0 % Final  . MCV 06/27/2015 86.4  78.0 - 100.0 fl Final  . MCHC 06/27/2015 32.2  30.0 - 36.0 g/dL Final  . RDW 06/27/2015 15.3  11.5 - 15.5 % Final  . TSH 06/27/2015 2.99  0.35 - 4.50 uIU/mL Final  . Cholesterol 06/27/2015 210* 0 - 200 mg/dL Final   ATP III Classification       Desirable:  < 200 mg/dL               Borderline High:  200 - 239 mg/dL          High:  > = 240 mg/dL  . Triglycerides 06/27/2015  153.0* 0.0 - 149.0 mg/dL Final   Normal:  <150 mg/dLBorderline High:  150 - 199 mg/dL  . HDL 06/27/2015 45.00  >39.00 mg/dL Final  . VLDL 06/27/2015 30.6  0.0 - 40.0 mg/dL Final  . LDL Cholesterol 06/27/2015 134* 0 - 99 mg/dL Final  . Total CHOL/HDL Ratio 06/27/2015 5   Final                  Men          Women1/2 Average Risk     3.4          3.3Average Risk          5.0          4.42X Average Risk          9.6          7.13X Average Risk          15.0          11.0                      . NonHDL 06/27/2015 164.64   Final   NOTE:  Non-HDL goal should be 30 mg/dL higher than patient's LDL goal (i.e. LDL goal of < 70 mg/dL, would have non-HDL goal of < 100 mg/dL)  . Hgb A1c MFr Bld 06/27/2015 8.5* 4.6 - 6.5 % Final   Glycemic Control Guidelines for People with Diabetes:Non Diabetic:  <6%Goal of Therapy: <7%Additional Action Suggested:  >8%   . Sodium 06/27/2015 141  135 - 145 mEq/L Final  . Potassium 06/27/2015 3.9  3.5 - 5.1 mEq/L Final  . Chloride 06/27/2015 102  96 - 112 mEq/L Final  . CO2 06/27/2015 29  19 - 32 mEq/L Final  . Glucose, Bld 06/27/2015 134* 70 - 99 mg/dL Final  . BUN 06/27/2015 24* 6 - 23 mg/dL Final  . Creatinine, Ser 06/27/2015  1.32* 0.40 - 1.20 mg/dL Final  . Total Bilirubin 06/27/2015 0.6  0.2 - 1.2 mg/dL Final  . Alkaline Phosphatase 06/27/2015 62  39 - 117 U/L Final  . AST 06/27/2015 14  0 - 37 U/L Final  . ALT 06/27/2015 14  0 - 35 U/L Final  . Total Protein 06/27/2015 7.3  6.0 - 8.3 g/dL Final  . Albumin 06/27/2015 4.1  3.5 - 5.2 g/dL Final  . Calcium 06/27/2015 9.7  8.4 - 10.5 mg/dL Final  . GFR 06/27/2015 51.25* >60.00 mL/min Final  . Microalb, Ur 06/27/2015 1.1  0.0 - 1.9 mg/dL Final  . Creatinine,U 06/27/2015 100.9   Final  . Microalb Creat Ratio 06/27/2015 1.1  0.0 - 30.0 mg/g Final    Physical Examination:  BP 148/84 mmHg  Pulse 104  Temp(Src) 98 F (36.7 C) (Oral)  Ht _0  (1.575 m)  Wt 185 lb (83.915 kg)  BMI 33.83 kg/m2  SpO2 92%   ASSESSMENT:  Diabetes type 2, uncontrolled  with BMI 34 See history of present illness for detailed discussion of his current management, blood sugar patterns and problems identified Currently on premixed insulin twice a day with variable control She is not getting consistent blood sugars Also has tendency to sporadic high readings based on her meal size She has little understanding about adjusting insulin and meal planning  Today had a glucose of 46 midday because she had a high reading before breakfast and practically skipped her breakfast He often does not check readings after supper but does have some significantly high  readings also at night  Discussed with the patient that she is a good candidate for a basal bolus insulin regimen with allowing flexibility of full-time doses and more consistent action of basal insulin and fasting blood sugars She could not afford the V-go pump   PLAN:    She will start using [place] for basal insulin starting with 22 units  Discussed in detail how basal insulin works and timing of injection.  Also discussed titration every 3 days based on fasting blood sugars and was given a flowsheet to do this  She will take  5-7 units of Humalog at mealtimes based on meal size  Discussed that if her postprandial readings are high she may need to increase her Humalog dose  Patient information booklets and co-pay card given  Continue metformin  Consider adding Invokana if blood sugar control is difficult but.if she can afford this  Patient Instructions  Toujeo insulin: This insulin provides blood sugar control for up to 24 hours.  Start with 22 units at bedtime daily and increase by 2 units every 3 days until the waking up sugars are under 130. Then continue the same dose. If blood sugar is under 90 for 2 days in a row, reduce the dose by 2 units. Note that this insulin does not control the rise of blood sugar with meals    Humalog 5--7--5 before the meal  Check blood sugars on waking up   times a week Also check blood sugars about 2 hours after a meal and do this after different meals by rotation  Recommended blood sugar levels on waking up is 90-130 and about 2 hours after meal is 130-160  Please bring your blood sugar monitor to each visit, thank you    Counseling time on subjects discussed above is over 50% of today's 25 minute visit   Toniqua Melamed 09/07/2015, 2:35 PM   Note: This office note was prepared with Dragon voice recognition system technology. Any transcriptional errors that result from this process are unintentional.

## 2015-09-12 ENCOUNTER — Other Ambulatory Visit: Payer: Self-pay | Admitting: Family Medicine

## 2015-09-19 ENCOUNTER — Other Ambulatory Visit: Payer: MEDICARE

## 2015-09-20 ENCOUNTER — Other Ambulatory Visit (INDEPENDENT_AMBULATORY_CARE_PROVIDER_SITE_OTHER): Payer: MEDICARE

## 2015-09-20 DIAGNOSIS — E1169 Type 2 diabetes mellitus with other specified complication: Secondary | ICD-10-CM | POA: Diagnosis not present

## 2015-09-20 DIAGNOSIS — Z1159 Encounter for screening for other viral diseases: Secondary | ICD-10-CM | POA: Diagnosis not present

## 2015-09-20 DIAGNOSIS — I10 Essential (primary) hypertension: Secondary | ICD-10-CM | POA: Diagnosis not present

## 2015-09-20 LAB — CBC
HCT: 33.4 % — ABNORMAL LOW (ref 36.0–46.0)
HEMOGLOBIN: 10.7 g/dL — AB (ref 12.0–15.0)
MCHC: 32 g/dL (ref 30.0–36.0)
MCV: 86.4 fl (ref 78.0–100.0)
PLATELETS: 696 10*3/uL — AB (ref 150.0–400.0)
RBC: 3.87 Mil/uL (ref 3.87–5.11)
RDW: 15 % (ref 11.5–15.5)
WBC: 10.7 10*3/uL — ABNORMAL HIGH (ref 4.0–10.5)

## 2015-09-20 LAB — LIPID PANEL
Cholesterol: 214 mg/dL — ABNORMAL HIGH (ref 0–200)
HDL: 54.5 mg/dL (ref >39.00)
NONHDL: 159.96
Total CHOL/HDL Ratio: 4
Triglycerides: 207 mg/dL — ABNORMAL HIGH (ref 0.0–149.0)
VLDL: 41.4 mg/dL — ABNORMAL HIGH (ref 0.0–40.0)

## 2015-09-20 LAB — COMPREHENSIVE METABOLIC PANEL
ALBUMIN: 4.3 g/dL (ref 3.5–5.2)
ALK PHOS: 63 U/L (ref 39–117)
ALT: 13 U/L (ref 0–35)
AST: 15 U/L (ref 0–37)
BUN: 18 mg/dL (ref 6–23)
CALCIUM: 9.6 mg/dL (ref 8.4–10.5)
CO2: 31 mEq/L (ref 19–32)
Chloride: 100 mEq/L (ref 96–112)
Creatinine, Ser: 1.29 mg/dL — ABNORMAL HIGH (ref 0.40–1.20)
GFR: 52.59 mL/min — AB (ref >60.00)
GLUCOSE: 126 mg/dL — AB (ref 70–99)
POTASSIUM: 3.8 meq/L (ref 3.5–5.1)
SODIUM: 139 meq/L (ref 135–145)
TOTAL PROTEIN: 7.4 g/dL (ref 6.0–8.3)
Total Bilirubin: 0.7 mg/dL (ref 0.2–1.2)

## 2015-09-20 LAB — LDL CHOLESTEROL, DIRECT: LDL DIRECT: 109 mg/dL

## 2015-09-20 LAB — HEPATITIS C ANTIBODY: HCV AB: NEGATIVE

## 2015-09-20 LAB — TSH: TSH: 2.33 u[IU]/mL (ref 0.35–4.50)

## 2015-09-20 LAB — HEMOGLOBIN A1C: HEMOGLOBIN A1C: 7.5 % — AB (ref 4.6–6.5)

## 2015-09-25 ENCOUNTER — Other Ambulatory Visit: Payer: Self-pay | Admitting: Family Medicine

## 2015-09-25 ENCOUNTER — Other Ambulatory Visit: Payer: MEDICARE

## 2015-09-28 ENCOUNTER — Ambulatory Visit: Payer: MEDICARE | Admitting: Endocrinology

## 2015-09-29 ENCOUNTER — Ambulatory Visit (INDEPENDENT_AMBULATORY_CARE_PROVIDER_SITE_OTHER): Payer: MEDICARE | Admitting: Family Medicine

## 2015-09-29 VITALS — BP 140/82 | HR 102 | Temp 98.6°F | Ht 61.5 in | Wt 184.1 lb

## 2015-09-29 DIAGNOSIS — E785 Hyperlipidemia, unspecified: Secondary | ICD-10-CM

## 2015-09-29 DIAGNOSIS — I10 Essential (primary) hypertension: Secondary | ICD-10-CM | POA: Diagnosis not present

## 2015-09-29 DIAGNOSIS — M8580 Other specified disorders of bone density and structure, unspecified site: Secondary | ICD-10-CM

## 2015-09-29 DIAGNOSIS — E0810 Diabetes mellitus due to underlying condition with ketoacidosis without coma: Secondary | ICD-10-CM | POA: Diagnosis not present

## 2015-09-29 DIAGNOSIS — Z0000 Encounter for general adult medical examination without abnormal findings: Secondary | ICD-10-CM

## 2015-09-29 DIAGNOSIS — E782 Mixed hyperlipidemia: Secondary | ICD-10-CM

## 2015-09-29 DIAGNOSIS — F418 Other specified anxiety disorders: Secondary | ICD-10-CM

## 2015-09-29 NOTE — Assessment & Plan Note (Signed)
hgba1c acceptable, minimize simple carbs. Increase exercise as tolerated. Continue current meds, has improved her eating habits and exercising

## 2015-09-29 NOTE — Assessment & Plan Note (Signed)
Well controlled, no changes to meds. Encouraged heart healthy diet such as the DASH diet and exercise as tolerated.  °

## 2015-09-29 NOTE — Patient Instructions (Signed)

## 2015-09-29 NOTE — Progress Notes (Signed)
Pre visit review using our clinic review tool, if applicable. No additional management support is needed unless otherwise documented below in the visit note. 

## 2015-10-08 NOTE — Assessment & Plan Note (Signed)
Encouraged to get adequate exercise, calcium and vitamin d intake 

## 2015-10-08 NOTE — Assessment & Plan Note (Signed)
Doing well on Lexapro, continue the same 

## 2015-10-08 NOTE — Progress Notes (Signed)
Subjective:    Patient ID: Beth Maldonado, female    DOB: 1946/06/25, 70 y.o.   MRN: [id number]  Chief Complaint  Patient presents with  . Follow-up    HPI Patient is in today for follow up. She feels well today. She acknowledges increased stressors recently but she is managing with Lexapro. Notes some anhedonia but denies suicidal ideation. Stopped Lipitor for no particular reason. Denies CP/palp/SOB/HA/congestion/fevers/GI or GU c/o. Taking meds as prescribed  Past Medical History  Diagnosis Date  . Osteopenia   . Diabetes mellitus   . Hypertension   . Acute bronchitis 03/29/2013  . Depression 06/27/2013  . Allergy   . Arthritis   . Depression with anxiety 06/27/2013  . Medicare annual wellness visit, subsequent 03/23/2014  . Neck pain on left side 05/29/2014  . Acute upper respiratory infection 11/27/2014  . Insomnia 06/30/2015  . Anemia 07/02/2015  . Preventative health care 07/02/2015    Past Surgical History  Procedure Laterality Date  . Cataract extraction, bilateral    . Laparoscopy      rt knee  . Breast surgery      Reduction  . Vaginal hysterectomy      partial  . Tubal ligation    . Hemorrhoid surgery      Family History  Problem Relation Age of Onset  . Diabetes Mother   . Heart attack Father   . Heart disease Father     59 and 22 MI  . Diabetes Sister   . Sickle cell anemia Sister   . Arthritis Sister   . Hypertension Sister   . Diabetes Sister   . Thyroid disease Sister   . Arthritis Sister   . Glaucoma Sister     Social History   Social History  . Marital Status: Married    Spouse Name: N/A  . Number of Children: 1  . Years of Education: N/A   Occupational History  . retired    Social History Main Topics  . Smoking status: Former Smoker -- 1.00 packs/day for 20 years    Types: Cigarettes    Start date: 10/03/1966    Quit date: 11/03/1986  . Smokeless tobacco: Never Used     Comment: quit 28 years ago  . Alcohol Use: 0.0 oz/week   0 Standard drinks or equivalent per week     Comment: Rare- Special Occasional   . Drug Use: No  . Sexual Activity: No     Comment: lives with husband, no dietary restrictions   Other Topics Concern  . Not on file   Social History Narrative    Outpatient Prescriptions Prior to Visit  Medication Sig Dispense Refill  . aspirin 81 MG tablet Take 81 mg by mouth daily.      Marland Kitchen b complex vitamins tablet Take 1 tablet by mouth daily.    . cyclobenzaprine (FLEXERIL) 5 MG tablet TAKE 1 TABLET BY MOUTH ATBEDTIME AS NEEDED FOR MUSCLE SPASMS. 30 tablet 1  . escitalopram (LEXAPRO) 20 MG tablet Take 1 tablet by mouth  daily 30 tablet 5  . gabapentin (NEURONTIN) 300 MG capsule TAKE 2 CAPSULES BY MOUTH 3 TIMES A DAY 180 capsule 3  . glucose blood (ONETOUCH VERIO) test strip Use as instructed to check blood sugar 4 times per day dx code E11.9 125 each 3  . hydrOXYzine (ATARAX/VISTARIL) 25 MG tablet TAKE 1 TABLET BY MOUTH TWICE A DAY 60 tablet 0  . Insulin Glargine (TOUJEO SOLOSTAR) 300 UNIT/ML SOPN Inject  22 Units into the skin daily. 3 pen 3  . insulin lispro (HUMALOG KWIKPEN) 100 UNIT/ML KiwkPen 5 units at breakfast and supper and 7 for lunch 15 mL 1  . Insulin Pen Needle 32G X 4 MM MISC Use for insulin injection twice a day 250.00 100 each 2  . Krill Oil CAPS MegaRed caps daily    . losartan (COZAAR) 100 MG tablet TAKE 1 TABLET BY MOUTH ONCE A DAY 30 tablet 3  . metFORMIN (GLUCOPHAGE) 500 MG tablet TAKE 2 TABLETS BY MOUTH TWICE DAILY WITH MEALS 120 tablet 3  . Multiple Vitamin (MULTIVITAMIN) tablet Take 1 tablet by mouth daily.    [name] DELICA LANCETS 99991111 MISC Use as directed to check blood sugar 4 times per day dx code E11.9 200 each 3  . triamterene-hydrochlorothiazide (MAXZIDE-25) 37.5-25 MG tablet TAKE 1 TABLET BY MOUTH DAILY. 30 tablet 6  . zoster vaccine live, PF, (ZOSTAVAX) 16109 UNT/0.65ML injection Inject 19,400 Units into the skin once. 1 each 0  . atorvastatin (LIPITOR) 10 MG tablet  Take 1 tablet (10 mg total) by mouth daily. (Patient not taking: Reported on 09/29/2015) 30 tablet 5   No facility-administered medications prior to visit.    Allergies  Allergen Reactions  . Penicillins Swelling    Review of Systems  Constitutional: Positive for malaise/fatigue. Negative for fever.  HENT: Negative for congestion.   Eyes: Negative for blurred vision.  Respiratory: Negative for shortness of breath.   Cardiovascular: Negative for chest pain, palpitations and leg swelling.  Gastrointestinal: Negative for nausea, abdominal pain and blood in stool.  Genitourinary: Negative for dysuria and frequency.  Musculoskeletal: Negative for falls.  Skin: Negative for rash.  Neurological: Negative for dizziness, loss of consciousness and headaches.  Endo/Heme/Allergies: Negative for environmental allergies.  Psychiatric/Behavioral: Negative for depression. The patient is not nervous/anxious.        Objective:    Physical Exam  Constitutional: She is oriented to person, place, and time. She appears well-developed and well-nourished. No distress.  HENT:  Head: Normocephalic and atraumatic.  Nose: Nose normal.  Eyes: Right eye exhibits no discharge. Left eye exhibits no discharge.  Neck: Normal range of motion. Neck supple.  Cardiovascular: Normal rate and regular rhythm.   No murmur heard. Pulmonary/Chest: Effort normal and breath sounds normal.  Abdominal: Soft. Bowel sounds are normal. There is no tenderness.  Musculoskeletal: She exhibits no edema.  Neurological: She is alert and oriented to person, place, and time.  Skin: Skin is warm and dry.  Psychiatric: She has a normal mood and affect.  Nursing note and vitals reviewed.   BP 140/82 mmHg  Pulse 102  Temp(Src) 98.6 F (37 C) (Oral)  Ht 5' 1.5" (1.562 m)  Wt 184 lb 2 oz (83.519 kg)  BMI 34.23 kg/m2  SpO2 96% Wt Readings from Last 3 Encounters:  09/29/15 184 lb 2 oz (83.519 kg)  09/07/15 185 lb (83.915 kg)    07/10/15 188 lb (85.276 kg)     Lab Results  Component Value Date   WBC 10.7* 09/20/2015   HGB 10.7* 09/20/2015   HCT 33.4* 09/20/2015   PLT 696.0* 09/20/2015   GLUCOSE 126* 09/20/2015   CHOL 214* 09/20/2015   TRIG 207.0* 09/20/2015   HDL 54.50 09/20/2015   LDLDIRECT 109.0 09/20/2015   LDLCALC 134* 06/27/2015   ALT 13 09/20/2015   AST 15 09/20/2015   NA 139 09/20/2015   K 3.8 09/20/2015   CL 100 09/20/2015   CREATININE 1.29*  09/20/2015   BUN 18 09/20/2015   CO2 31 09/20/2015   TSH 2.33 09/20/2015   HGBA1C 7.5* 09/20/2015   MICROALBUR 1.1 06/27/2015    Lab Results  Component Value Date   TSH 2.33 09/20/2015   Lab Results  Component Value Date   WBC 10.7* 09/20/2015   HGB 10.7* 09/20/2015   HCT 33.4* 09/20/2015   MCV 86.4 09/20/2015   PLT 696.0* 09/20/2015   Lab Results  Component Value Date   NA 139 09/20/2015   K 3.8 09/20/2015   CO2 31 09/20/2015   GLUCOSE 126* 09/20/2015   BUN 18 09/20/2015   CREATININE 1.29* 09/20/2015   BILITOT 0.7 09/20/2015   ALKPHOS 63 09/20/2015   AST 15 09/20/2015   ALT 13 09/20/2015   PROT 7.4 09/20/2015   ALBUMIN 4.3 09/20/2015   CALCIUM 9.6 09/20/2015   GFR 52.59* 09/20/2015   Lab Results  Component Value Date   CHOL 214* 09/20/2015   Lab Results  Component Value Date   HDL 54.50 09/20/2015   Lab Results  Component Value Date   LDLCALC 134* 06/27/2015   Lab Results  Component Value Date   TRIG 207.0* 09/20/2015   Lab Results  Component Value Date   CHOLHDL 4 09/20/2015   Lab Results  Component Value Date   HGBA1C 7.5* 09/20/2015       Assessment & Plan:   Problem List Items Addressed This Visit    Depression with anxiety    Doing well on Lexapro, continue the same      Diabetes mellitus ([place])    hgba1c acceptable, minimize simple carbs. Increase exercise as tolerated. Continue current meds, has improved her eating habits and exercising      Relevant Orders   TSH   CBC   Comprehensive  metabolic panel   Lipid panel   Hemoglobin A1c   Hyperlipidemia    Stopped her lipitor for no specific reason but agrees to restart it after discussion      Hypertension - Primary    Well controlled, no changes to meds. Encouraged heart healthy diet such as the DASH diet and exercise as tolerated.       Relevant Orders   TSH   CBC   Comprehensive metabolic panel   Lipid panel   Hemoglobin A1c   Osteopenia    Encouraged to get adequate exercise, calcium and vitamin d intake      Preventative health care   Relevant Orders   Hepatitis C antibody    Other Visit Diagnoses    Hyperlipidemia, mixed        Relevant Orders    TSH    CBC    Comprehensive metabolic panel    Lipid panel    Hemoglobin A1c       I am having Beth Maldonado maintain her aspirin, Krill Oil, Insulin Pen Needle, multivitamin, losartan, zoster vaccine live (PF), escitalopram, cyclobenzaprine, triamterene-hydrochlorothiazide, b complex vitamins, atorvastatin, metFORMIN, ONETOUCH DELICA LANCETS [id number], glucose blood, Insulin Glargine, insulin lispro, hydrOXYzine, and gabapentin.  No orders of the defined types were placed in this encounter.     Penni Homans, MD

## 2015-10-08 NOTE — Assessment & Plan Note (Signed)
Stopped her lipitor for no specific reason but agrees to restart it after discussion

## 2015-10-12 ENCOUNTER — Other Ambulatory Visit: Payer: Self-pay | Admitting: Family Medicine

## 2015-10-13 ENCOUNTER — Ambulatory Visit (AMBULATORY_SURGERY_CENTER): Payer: Self-pay | Admitting: *Deleted

## 2015-10-13 VITALS — Ht 62.0 in | Wt 186.6 lb

## 2015-10-13 DIAGNOSIS — Z1211 Encounter for screening for malignant neoplasm of colon: Secondary | ICD-10-CM

## 2015-10-13 NOTE — Progress Notes (Signed)
No allergies to eggs or soy. No problems with anesthesia.  Pt given Emmi instructions for colonoscopy  No oxygen use  No diet drug use  

## 2015-10-14 ENCOUNTER — Encounter (HOSPITAL_BASED_OUTPATIENT_CLINIC_OR_DEPARTMENT_OTHER): Payer: Self-pay | Admitting: *Deleted

## 2015-10-14 ENCOUNTER — Emergency Department (HOSPITAL_BASED_OUTPATIENT_CLINIC_OR_DEPARTMENT_OTHER)
Admission: EM | Admit: 2015-10-14 | Discharge: 2015-10-14 | Disposition: A | Discharge status: Home / Self Care | Payer: MEDICARE | Attending: Emergency Medicine | Admitting: Emergency Medicine

## 2015-10-14 DIAGNOSIS — Z7984 Long term (current) use of oral hypoglycemic drugs: Secondary | ICD-10-CM | POA: Diagnosis not present

## 2015-10-14 DIAGNOSIS — Z79899 Other long term (current) drug therapy: Secondary | ICD-10-CM | POA: Insufficient documentation

## 2015-10-14 DIAGNOSIS — Z862 Personal history of diseases of the blood and blood-forming organs and certain disorders involving the immune mechanism: Secondary | ICD-10-CM | POA: Diagnosis not present

## 2015-10-14 DIAGNOSIS — M546 Pain in thoracic spine: Secondary | ICD-10-CM | POA: Diagnosis not present

## 2015-10-14 DIAGNOSIS — Z794 Long term (current) use of insulin: Secondary | ICD-10-CM | POA: Diagnosis not present

## 2015-10-14 DIAGNOSIS — Z8709 Personal history of other diseases of the respiratory system: Secondary | ICD-10-CM | POA: Diagnosis not present

## 2015-10-14 DIAGNOSIS — Z880 Allergy status to penicillin: Secondary | ICD-10-CM | POA: Insufficient documentation

## 2015-10-14 DIAGNOSIS — G629 Polyneuropathy, unspecified: Secondary | ICD-10-CM | POA: Insufficient documentation

## 2015-10-14 DIAGNOSIS — E785 Hyperlipidemia, unspecified: Secondary | ICD-10-CM | POA: Insufficient documentation

## 2015-10-14 DIAGNOSIS — F418 Other specified anxiety disorders: Secondary | ICD-10-CM | POA: Insufficient documentation

## 2015-10-14 DIAGNOSIS — Z87891 Personal history of nicotine dependence: Secondary | ICD-10-CM | POA: Insufficient documentation

## 2015-10-14 DIAGNOSIS — M545 Low back pain, unspecified: Secondary | ICD-10-CM

## 2015-10-14 DIAGNOSIS — M1990 Unspecified osteoarthritis, unspecified site: Secondary | ICD-10-CM | POA: Diagnosis not present

## 2015-10-14 DIAGNOSIS — G8929 Other chronic pain: Secondary | ICD-10-CM | POA: Insufficient documentation

## 2015-10-14 DIAGNOSIS — R252 Cramp and spasm: Secondary | ICD-10-CM | POA: Diagnosis present

## 2015-10-14 DIAGNOSIS — Z7982 Long term (current) use of aspirin: Secondary | ICD-10-CM | POA: Diagnosis not present

## 2015-10-14 DIAGNOSIS — E119 Type 2 diabetes mellitus without complications: Secondary | ICD-10-CM | POA: Diagnosis not present

## 2015-10-14 DIAGNOSIS — I10 Essential (primary) hypertension: Secondary | ICD-10-CM | POA: Insufficient documentation

## 2015-10-14 MED ORDER — METHOCARBAMOL 500 MG PO TABS: 500.0000 mg | ORAL_TABLET | Freq: Two times a day (BID) | ORAL | Status: DC

## 2015-10-14 MED ORDER — OXYCODONE-ACETAMINOPHEN 5-325 MG PO TABS
1.0000 | ORAL_TABLET | Freq: Once | ORAL | Status: AC
  Administered 2015-10-14: 1 via ORAL
  Filled 2015-10-14: qty 1

## 2015-10-14 MED ORDER — LIDOCAINE 5 % EX PTCH
1.0000 | MEDICATED_PATCH | CUTANEOUS | Status: DC
  Filled 2015-10-14: qty 1

## 2015-10-14 MED ORDER — METHOCARBAMOL 500 MG PO TABS
1000.0000 mg | ORAL_TABLET | Freq: Once | ORAL | Status: AC
  Administered 2015-10-14: 1000 mg via ORAL
  Filled 2015-10-14: qty 2

## 2015-10-14 MED ORDER — KETOROLAC TROMETHAMINE 60 MG/2ML IM SOLN
30.0000 mg | Freq: Once | INTRAMUSCULAR | Status: AC
  Administered 2015-10-14: 30 mg via INTRAMUSCULAR
  Filled 2015-10-14: qty 2

## 2015-10-14 MED ORDER — OXYCODONE-ACETAMINOPHEN 5-325 MG PO TABS: 1.0000 | ORAL_TABLET | ORAL | Status: DC | PRN

## 2015-10-14 MED ORDER — NAPROXEN 500 MG PO TABS: 500.0000 mg | ORAL_TABLET | Freq: Two times a day (BID) | ORAL | Status: DC

## 2015-10-14 MED ORDER — LIDOCAINE 5 % EX PTCH: 1.0000 | MEDICATED_PATCH | CUTANEOUS | Status: DC

## 2015-10-14 NOTE — ED Provider Notes (Signed)
CSN: [id number]     Arrival date & time 10/14/15  1254 History   First MD Initiated Contact with Patient 10/14/15 1322     Chief Complaint  Patient presents with  . Spasms     (Consider location/radiation/quality/duration/timing/severity/associated sxs/prior Treatment) HPI   Beth Maldonado is a 70 y.o. female, with a history of lower back pain, DM, and hypertension, presenting to the ED with acute on chronic lower back pain that recurred around 5 days ago. Pt rates her pain at 10/10, tight/throbbing,  Pt states that she has been lifting a lot of heavy furniture over the past couple weeks. Pt takes gabapentin and flexeril for her back pain, which usually helps, but has not helped like it usually does. Pt states she would have gone to her PCP, but didn't feel like she could wait till Monday. Patient denies fever/chills, nausea/vomiting, abdominal pain, urinary complaints, abnormal vaginal discharge, bowel or bladder abnormalities, or any other complaints.    Past Medical History  Diagnosis Date  . Osteopenia   . Diabetes mellitus   . Hypertension   . Acute bronchitis 03/29/2013  . Depression 06/27/2013  . Allergy   . Arthritis   . Depression with anxiety 06/27/2013  . Medicare annual wellness visit, subsequent 03/23/2014  . Neck pain on left side 05/29/2014  . Acute upper respiratory infection 11/27/2014  . Insomnia 06/30/2015  . Anemia 07/02/2015  . Preventative health care 07/02/2015  . Hyperlipidemia   . Neuropathy ([place])   . Neuromuscular disorder ([place])     neuropathy feet   Past Surgical History  Procedure Laterality Date  . Cataract extraction, bilateral  2011  . Knee arthroscopy Right 2003  . Breast enhancement surgery Bilateral 1987    Reduction  . Vaginal hysterectomy  1974    partial  . Tubal ligation  1970  . Hemorrhoid surgery  1970   Family History  Problem Relation Age of Onset  . Diabetes Mother   . Heart attack Father   . Heart disease Father     56 and 81 MI   . Diabetes Sister   . Sickle cell anemia Sister   . Arthritis Sister   . Hypertension Sister   . Diabetes Sister   . Thyroid disease Sister   . Arthritis Sister   . Glaucoma Sister   . Colon cancer Neg Hx    Social History  Substance Use Topics  . Smoking status: Former Smoker -- 1.00 packs/day for 20 years    Types: Cigarettes    Start date: 10/03/1966    Quit date: 11/03/1986  . Smokeless tobacco: Never Used     Comment: quit 28 years ago  . Alcohol Use: 0.0 oz/week    0 Standard drinks or equivalent per week     Comment: Rare- Special Occasional    OB History    Gravida Para Term Preterm AB TAB SAB Ectopic Multiple Living   1 1 1       1      Review of Systems  Constitutional: Negative for fever, chills and diaphoresis.  Respiratory: Negative for shortness of breath.   Cardiovascular: Negative for chest pain.  Gastrointestinal: Negative for nausea, vomiting, abdominal pain, diarrhea and constipation.  Genitourinary: Negative for dysuria.  Musculoskeletal: Positive for back pain.  Skin: Negative for color change and pallor.  Neurological: Negative for dizziness, syncope, weakness, light-headedness, numbness and headaches.  All other systems reviewed and are negative.     Allergies  Penicillins  Home  Medications   Prior to Admission medications   Medication Sig Start Date End Date Taking? Authorizing Provider  aspirin 81 MG tablet Take 81 mg by mouth daily.      Historical Provider, MD  atorvastatin (LIPITOR) 10 MG tablet Take 1 tablet (10 mg total) by mouth daily. 07/03/15   Mosie Lukes, MD  Bisacodyl (DULCOLAX PO) Take by mouth as directed. Dulcolax as directed for colonoscopy prep    Historical Provider, MD  cyclobenzaprine (FLEXERIL) 5 MG tablet TAKE 1 TABLET BY MOUTH ATBEDTIME AS NEEDED FOR MUSCLE SPASMS. 10/12/15   Mosie Lukes, MD  escitalopram (LEXAPRO) 20 MG tablet Take 1 tablet by mouth  daily 04/05/15   Mosie Lukes, MD  gabapentin (NEURONTIN) 300  MG capsule TAKE 2 CAPSULES BY MOUTH 3 TIMES A DAY 09/25/15   Mosie Lukes, MD  glucose blood (ONETOUCH VERIO) test strip Use as instructed to check blood sugar 4 times per day dx code E11.9 08/02/15   Elayne Snare, MD  hydrOXYzine (ATARAX/VISTARIL) 25 MG tablet TAKE 1 TABLET BY MOUTH TWICE A DAY 10/12/15   Mosie Lukes, MD  Insulin Glargine (TOUJEO SOLOSTAR) 300 UNIT/ML SOPN Inject 22 Units into the skin daily. 09/07/15   Elayne Snare, MD  insulin lispro (HUMALOG KWIKPEN) 100 UNIT/ML KiwkPen 5 units at breakfast and supper and 7 for lunch Patient taking differently: 5 units at breakfast and supper and 5 for lunch 09/07/15   Elayne Snare, MD  Insulin Pen Needle 32G X 4 MM MISC Use for insulin injection twice a day 250.00 08/25/13   Mosie Lukes, MD  lidocaine (LIDODERM) 5 % Place 1 patch onto the skin daily. Remove & Discard patch within 12 hours or as directed by MD 10/14/15   Helane Gunther Jaymarie Yeakel, PA-C  losartan (COZAAR) 100 MG tablet TAKE 1 TABLET BY MOUTH ONCE A DAY 02/23/14   Mosie Lukes, MD  metFORMIN (GLUCOPHAGE) 500 MG tablet TAKE 2 TABLETS BY MOUTH TWICE DAILY WITH MEALS 07/10/15   Mosie Lukes, MD  methocarbamol (ROBAXIN) 500 MG tablet Take 1 tablet (500 mg total) by mouth 2 (two) times daily. 10/14/15   Jamine Highfill C Hillarie Harrigan, PA-C  Multiple Vitamin (MULTIVITAMIN) tablet Take 1 tablet by mouth daily.    Historical Provider, MD  naproxen (NAPROSYN) 500 MG tablet Take 1 tablet (500 mg total) by mouth 2 (two) times daily. 10/14/15   Johnthomas Lader C Monica Zahler, PA-C  ONETOUCH DELICA LANCETS [id number] MISC Use as directed to check blood sugar 4 times per day dx code E11.9 07/12/15   Elayne Snare, MD  oxyCODONE-acetaminophen (PERCOCET/ROXICET) 5-325 MG tablet Take 1-2 tablets by mouth every 4 (four) hours as needed for severe pain. 10/14/15   Arley Salamone C Maijor Hornig, PA-C  Polyethylene Glycol 3350 (MIRALAX PO) Take by mouth as directed. Miralax 238 Grams as directed for colonoscopy prep    Historical Provider, MD  triamterene-hydrochlorothiazide (MAXZIDE-25)  37.5-25 MG tablet TAKE 1 TABLET BY MOUTH DAILY. 05/10/15   Mosie Lukes, MD  zoster vaccine live, PF, (ZOSTAVAX) 13086 UNT/0.65ML injection Inject 19,400 Units into the skin once. 03/31/15   Mosie Lukes, MD   BP 142/83 mmHg  Pulse 94  Temp(Src) 98.5 F (36.9 C) (Oral)  Resp 18  Ht 5\' 2"  (1.575 m)  Wt 84.369 kg  BMI 34.01 kg/m2  SpO2 98% Physical Exam  Constitutional: She is oriented to person, place, and time. She appears well-developed and well-nourished. No distress.  HENT:  Head: Normocephalic and atraumatic.  Eyes: Conjunctivae are normal. Pupils are equal, round, and reactive to light.  Neck: Normal range of motion. Neck supple.  Cardiovascular: Normal rate, regular rhythm, normal heart sounds and intact distal pulses.   Pulmonary/Chest: Effort normal and breath sounds normal. No respiratory distress.  Abdominal: Soft. Bowel sounds are normal. There is no tenderness. There is no guarding.  Musculoskeletal: She exhibits no edema or tenderness.  Tenderness to the right thoracic and lumbar musculature. Patient's pain is reproducible with movement and palpation. Full ROM in all extremities and spine. No paraspinal tenderness.   Lymphadenopathy:    She has no cervical adenopathy.  Neurological: She is alert and oriented to person, place, and time. She has normal reflexes.  No sensory deficits. Strength 5/5 in all extremities. No gait disturbance. Coordination intact. Cranial nerves III-XII grossly intact.   Skin: Skin is warm and dry. She is not diaphoretic.  Psychiatric: She has a normal mood and affect. Her behavior is normal.  Nursing note and vitals reviewed.   ED Course  Procedures (including critical care time)   Medications  ketorolac (TORADOL) injection 30 mg (30 mg Intramuscular Given 10/14/15 1348)  oxyCODONE-acetaminophen (PERCOCET/ROXICET) 5-325 MG per tablet 1 tablet (1 tablet Oral Given 10/14/15 1347)  methocarbamol (ROBAXIN) tablet 1,000 mg (1,000 mg Oral Given  10/14/15 1347)    MDM   Final diagnoses:  Right-sided low back pain without sciatica    [name] presents with lower and mid back pain following heavy lifting, beginning 5 days ago.  Patient's pain is reproducible with palpation and movement. No neurologic or functional deficits. No red flag symptoms. No symptoms of cauda equina. No indication for imaging at this time. Patient to follow up with PCP on Monday. Home care and return precautions discussed. Patient voiced understanding of these instructions and is comfortable with discharge. Patient appears safe for discharge at this time.  Filed Vitals:   10/14/15 1307  BP: 142/83  Pulse: 94  Temp: 98.5 F (36.9 C)  TempSrc: Oral  Resp: 18  Height: 5\' 2"  (1.575 m)  Weight: 84.369 kg  SpO2: 98%     Lorayne Bender, PA-C 10/15/15 0831  Alfonzo Beers, MD 10/15/15 [phone]

## 2015-10-14 NOTE — Discharge Instructions (Signed)
You have been seen today for back pain. Follow up with PCP as soon as possible for reevaluation and chronic management of this issue. Return to ED should symptoms worsen. Do not drive or perform other dangerous tasks while using the Percocet or the Robaxin.

## 2015-10-14 NOTE — ED Notes (Signed)
Pt reports mid back spasms since Tuesday-hx of same.  Reports moving furniture.

## 2015-10-17 ENCOUNTER — Other Ambulatory Visit: Payer: Self-pay | Admitting: Family Medicine

## 2015-10-27 ENCOUNTER — Encounter: Payer: Self-pay | Admitting: Internal Medicine

## 2015-10-27 ENCOUNTER — Other Ambulatory Visit (INDEPENDENT_AMBULATORY_CARE_PROVIDER_SITE_OTHER): Payer: MEDICARE

## 2015-10-27 ENCOUNTER — Ambulatory Visit (AMBULATORY_SURGERY_CENTER): Payer: MEDICARE | Admitting: Internal Medicine

## 2015-10-27 VITALS — BP 120/78 | HR 71 | Temp 99.3°F | Resp 15 | Ht 62.0 in | Wt 186.0 lb

## 2015-10-27 DIAGNOSIS — D124 Benign neoplasm of descending colon: Secondary | ICD-10-CM

## 2015-10-27 DIAGNOSIS — D125 Benign neoplasm of sigmoid colon: Secondary | ICD-10-CM

## 2015-10-27 DIAGNOSIS — E119 Type 2 diabetes mellitus without complications: Secondary | ICD-10-CM | POA: Diagnosis not present

## 2015-10-27 DIAGNOSIS — Z1211 Encounter for screening for malignant neoplasm of colon: Secondary | ICD-10-CM | POA: Diagnosis not present

## 2015-10-27 DIAGNOSIS — I10 Essential (primary) hypertension: Secondary | ICD-10-CM | POA: Diagnosis not present

## 2015-10-27 DIAGNOSIS — Z86010 Personal history of colonic polyps: Secondary | ICD-10-CM

## 2015-10-27 DIAGNOSIS — C187 Malignant neoplasm of sigmoid colon: Secondary | ICD-10-CM

## 2015-10-27 DIAGNOSIS — Z860101 Personal history of adenomatous and serrated colon polyps: Secondary | ICD-10-CM

## 2015-10-27 DIAGNOSIS — F329 Major depressive disorder, single episode, unspecified: Secondary | ICD-10-CM | POA: Diagnosis not present

## 2015-10-27 HISTORY — DX: Personal history of adenomatous and serrated colon polyps: Z86.0101

## 2015-10-27 HISTORY — DX: Personal history of colonic polyps: Z86.010

## 2015-10-27 LAB — CBC WITH DIFFERENTIAL/PLATELET
BASOS PCT: 0.6 % (ref 0.0–3.0)
Basophils Absolute: 0.1 10*3/uL (ref 0.0–0.1)
EOS ABS: 0.8 10*3/uL — AB (ref 0.0–0.7)
EOS PCT: 7.9 % — AB (ref 0.0–5.0)
HEMATOCRIT: 33.3 % — AB (ref 36.0–46.0)
HEMOGLOBIN: 10.7 g/dL — AB (ref 12.0–15.0)
LYMPHS PCT: 26.5 % (ref 12.0–46.0)
Lymphs Abs: 2.7 10*3/uL (ref 0.7–4.0)
MCHC: 32.3 g/dL (ref 30.0–36.0)
MCV: 84.9 fl (ref 78.0–100.0)
MONOS PCT: 6.5 % (ref 3.0–12.0)
Monocytes Absolute: 0.7 10*3/uL (ref 0.1–1.0)
Neutro Abs: 5.9 10*3/uL (ref 1.4–7.7)
Neutrophils Relative %: 58.5 % (ref 43.0–77.0)
Platelets: 555 10*3/uL — ABNORMAL HIGH (ref 150.0–400.0)
RBC: 3.92 Mil/uL (ref 3.87–5.11)
RDW: 14.7 % (ref 11.5–15.5)
WBC: 10 10*3/uL (ref 4.0–10.5)

## 2015-10-27 LAB — COMPREHENSIVE METABOLIC PANEL
ALT: 11 U/L (ref 0–35)
AST: 14 U/L (ref 0–37)
Albumin: 4.2 g/dL (ref 3.5–5.2)
Alkaline Phosphatase: 57 U/L (ref 39–117)
BUN: 11 mg/dL (ref 6–23)
CALCIUM: 9.4 mg/dL (ref 8.4–10.5)
CO2: 29 meq/L (ref 19–32)
Chloride: 104 mEq/L (ref 96–112)
Creatinine, Ser: 1.17 mg/dL (ref 0.40–1.20)
GFR: 58.84 mL/min — AB (ref >60.00)
GLUCOSE: 88 mg/dL (ref 70–99)
POTASSIUM: 3.2 meq/L — AB (ref 3.5–5.1)
Sodium: 141 mEq/L (ref 135–145)
Total Bilirubin: 0.7 mg/dL (ref 0.2–1.2)
Total Protein: 7.4 g/dL (ref 6.0–8.3)

## 2015-10-27 LAB — GLUCOSE, CAPILLARY
Glucose-Capillary: 125 mg/dL — ABNORMAL HIGH (ref 65–99)
Glucose-Capillary: 93 mg/dL (ref 65–99)

## 2015-10-27 MED ORDER — SODIUM CHLORIDE 0.9 % IV SOLN: 500.0000 mL | INTRAVENOUS | Status: DC

## 2015-10-27 NOTE — Op Note (Signed)
[facility] Patient Name: Beth Maldonado Procedure Date: 10/27/2015 12:02 PM MRN: [id number] Endoscopist: Gatha Mayer , MD Age: 70 Referring MD:  Date of Birth: 1946/04/06 Gender: Female Procedure:                Colonoscopy Indications:              Screening for colorectal malignant neoplasm Medicines:                Propofol per Anesthesia, Monitored Anesthesia Care Procedure:                Pre-Anesthesia Assessment:                           - Prior to the procedure, a History and Physical                            was performed, and patient medications and                            allergies were reviewed. [name] tolerance of                            previous anesthesia was also reviewed. The risks                            and benefits of the procedure and the sedation                            options and risks were discussed with the patient.                            All questions were answered, and informed consent                            was obtained. Prior Anticoagulants: The patient has                            taken no previous anticoagulant or antiplatelet                            agents. ASA Grade Assessment: III - A patient with                            severe systemic disease. After reviewing the risks                            and benefits, the patient was deemed in                            satisfactory condition to undergo the procedure.                           After obtaining informed consent, the colonoscope  was passed under direct vision. Throughout the                            procedure, the patient's blood pressure, pulse, and                            oxygen saturations were monitored continuously. The                            Model CF-HQ190L [id number]) scope was introduced                            through the anus and advanced to the the cecum,                            identified  by appendiceal orifice and ileocecal                            valve. The colonoscopy was performed without                            difficulty. The patient tolerated the procedure                            fairly well. The quality of the bowel preparation                            was adequate to identify polyps 6 mm and larger in                            size. The ileocecal valve, appendiceal orifice, and                            rectum were photographed. Scope In: 12:13:14 PM Scope Out: 12:40:49 PM Scope Withdrawal Time: 0 hours 20 minutes 31 seconds  Total Procedure Duration: 0 hours 27 minutes 35 seconds  Findings:      The perianal exam was abnormal.      The digital rectal exam findings include decreased sphincter tone.      An ulcerated non-obstructing large mass was found in the proximal       sigmoid colon. The mass was non-circumferential. No bleeding was       present. Biopsies were taken with a cold forceps for histology.       Estimated blood loss was minimal. Area was tattooed with an injection of       5 mL of Spot (carbon black).      Two sessile polyps were found in the mid descending colon. The polyps       were 10 to 14 mm in size. These polyps were removed with a hot snare.       Resection and retrieval were complete. Verification of patient       identification for the specimen was done. Estimated blood loss was       minimal.      Diverticula were found in the left colon.  The exam was otherwise without abnormality on direct and retroflexion       views. Complications:            No immediate complications. Estimated Blood Loss:     Estimated blood loss was minimal. Impression:               - Abnormal perianal exam.                           - Decreased sphincter tone found on digital rectal                            exam.                           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                            Biopsied. Tattooed.                            - Two 10 to 14 mm polyps in the mid descending                            colon, removed with a hot snare. Resected and                            retrieved.                           - Severe diverticulosis in the left colon.                           - The examination was otherwise normal on direct                            and retroflexion views. Recommendation:           - Patient has a contact number available for                            emergencies. The signs and symptoms of potential                            delayed complications were discussed with the                            patient. Return to normal activities tomorrow.                            Written discharge instructions were provided to the                            patient.                           - Resume previous diet.                           -  Continue present medications.                           - Await pathology results.                           - No aspirin, ibuprofen, naproxen, or other                            non-steroidal anti-inflammatory drugs for 2 weeks                            after polyp removal.                           - Check liver enzymes (AST, ALT, alkaline                            phosphatase, bilirubin), hemogram with white blood                            cell count and platelets and alpha-fetoprotein                            today.                           - Perform CT scan (computed tomography) of the                            chest with contrast at appointment to be scheduled.                           - Perform CT scan (computed tomography) of the                            abdomen with contrast at appointment to be                            scheduled.                           - Perform CT scan (computed tomography) of the                            pelvis at appointment to be scheduled.                           - Repeat colonoscopy is  recommended. The                            colonoscopy date will be determined after pathology                            results from today's exam become available for  review. Procedure Code(s):        --- Professional ---                           [phone], Colonoscopy, flexible; with removal of                            tumor(s), polyp(s), or other lesion(s) by snare                            technique                           45381, Colonoscopy, flexible; with directed                            submucosal injection(s), any substance                           [id number], 59, Colonoscopy, flexible; with biopsy,                            single or multiple CPT copyright 2016 [employer]. All rights reserved. Gatha Mayer, MD 10/27/2015 12:56:35 PM This report has been signed electronically. Number of Addenda: 0 CC Letter to:             Stacey A. Charlett Blake Referring MD:      Bonnita Levan. Charlett Blake

## 2015-10-27 NOTE — Progress Notes (Signed)
Called to room to assist during endoscopic procedure.  Patient ID and intended procedure confirmed with present staff. Received instructions for my participation in the procedure from the performing physician.  

## 2015-10-27 NOTE — Progress Notes (Signed)
Report to PACU, RN, vss, BBS= Clear.  

## 2015-10-27 NOTE — Patient Instructions (Addendum)
Unfortunately it looks like you have a colon cancer.  I took biopsies - we will have you get labs today and also arrange CT scans.  You will also be scheduled to see a surgeon about this.  I appreciate the opportunity to care for you. Gatha Mayer, MD, FACG YOU HAD AN ENDOSCOPIC PROCEDURE TODAY AT [facility]:   Refer to the procedure report that was given to you for any specific questions about what was found during the examination.  If the procedure report does not answer your questions, please call your gastroenterologist to clarify.  If you requested that your care partner not be given the details of your procedure findings, then the procedure report has been included in a sealed envelope for you to review at your convenience later.  YOU SHOULD EXPECT: Some feelings of bloating in the abdomen. Passage of more gas than usual.  Walking can help get rid of the air that was put into your GI tract during the procedure and reduce the bloating. If you had a lower endoscopy (such as a colonoscopy or flexible sigmoidoscopy) you may notice spotting of blood in your stool or on the toilet paper. If you underwent a bowel prep for your procedure, you may not have a normal bowel movement for a few days.  Please Note:  You might notice some irritation and congestion in your nose or some drainage.  This is from the oxygen used during your procedure.  There is no need for concern and it should clear up in a day or so.  SYMPTOMS TO REPORT IMMEDIATELY:   Following lower endoscopy (colonoscopy or flexible sigmoidoscopy):  Excessive amounts of blood in the stool  Significant tenderness or worsening of abdominal pains  Swelling of the abdomen that is new, acute  Fever of 100F or higher   For urgent or emergent issues, a gastroenterologist can be reached at any hour by calling [phone].   DIET: Your first meal following the procedure should be a small meal and then it is  ok to progress to your normal diet. Heavy or fried foods are harder to digest and may make you feel nauseous or bloated.  Likewise, meals heavy in dairy and vegetables can increase bloating.  Drink plenty of fluids but you should avoid alcoholic beverages for 24 hours.  ACTIVITY:  You should plan to take it easy for the rest of today and you should NOT DRIVE or use heavy machinery until tomorrow (because of the sedation medicines used during the test).    FOLLOW UP: Our staff will call the number listed on your records the next business day following your procedure to check on you and address any questions or concerns that you may have regarding the information given to you following your procedure. If we do not reach you, we will leave a message.  However, if you are feeling well and you are not experiencing any problems, there is no need to return our call.  We will assume that you have returned to your regular daily activities without incident.  If any biopsies were taken you will be contacted by phone or by letter within the next 1-3 weeks.  Please call us at [phone] if you have not heard about the biopsies in 3 weeks.    SIGNATURES/CONFIDENTIALITY: You and/or your care partner have signed paperwork which will be entered into your electronic medical record.  These signatures attest to the fact that that the information  above on your After Visit Summary has been reviewed and is understood.  Full responsibility of the confidentiality of this discharge information lies with you and/or your care-partner.  Resume regular diet and medications Polyp/ Diverticulosis handout given Await pathology results

## 2015-10-28 LAB — CEA: CEA: 2.9 ng/mL — ABNORMAL HIGH

## 2015-10-30 ENCOUNTER — Other Ambulatory Visit: Payer: Self-pay

## 2015-10-30 ENCOUNTER — Telehealth: Payer: Self-pay

## 2015-10-30 DIAGNOSIS — K6389 Other specified diseases of intestine: Secondary | ICD-10-CM

## 2015-10-30 NOTE — Progress Notes (Signed)
Patient scheduled for CT scan Chest , abdomen, and pelvis for tomorrow at 10:00 per instructions on colonoscopy from 10/27/15.  I left detailed instructions for the patient on her voicemail.  [place] office phones are not working at this time.  I will continue to try and reach her today to confirm she received instructions.  I did ask that she call and let us know if she got my message.

## 2015-10-30 NOTE — Telephone Encounter (Signed)
  Follow up Call-  Call back number 10/27/2015  Post procedure Call Back phone  # [phone]  Permission to leave phone message Yes    Patient has been able to eat food without any problems. The answer was accidentally checked wrong.  Patient questions:  Do you have a fever, pain , or abdominal swelling? No. Pain Score  0 *  Have you tolerated food without any problems? No.  Have you been able to return to your normal activities? Yes.    Do you have any questions about your discharge instructions: Diet   No. Medications  No. Follow up visit  No.  Do you have questions or concerns about your Care? No.  Actions: * If pain score is 4 or above: No action needed, pain <4.

## 2015-10-31 ENCOUNTER — Inpatient Hospital Stay: Admission: RE | Admit: 2015-10-31 | Payer: MEDICARE | Source: Ambulatory Visit

## 2015-10-31 NOTE — Progress Notes (Signed)
Quick Note:  I called results to her - the mass biopsies did not show cancer - I think it was a sampling miss - at any rate size is such that surgery needed so refer to Dr. [name] please re: sigmoid mass that looks like cancer  [facility] - she needs a colon recall 1 year - no letter  Am ccing her PCP also   ______

## 2015-10-31 NOTE — Progress Notes (Signed)
Quick Note:  K low from prep I think - no treatment at this point Hgb stable - called her and advised ferrous sulfate 325 mg bid ______

## 2015-11-02 ENCOUNTER — Other Ambulatory Visit: Payer: Self-pay

## 2015-11-02 ENCOUNTER — Ambulatory Visit (INDEPENDENT_AMBULATORY_CARE_PROVIDER_SITE_OTHER)
Admission: RE | Admit: 2015-11-02 | Discharge: 2015-11-02 | Disposition: A | Discharge status: Home / Self Care | Payer: MEDICARE | Source: Ambulatory Visit | Attending: Internal Medicine | Admitting: Internal Medicine

## 2015-11-02 DIAGNOSIS — K6389 Other specified diseases of intestine: Secondary | ICD-10-CM | POA: Diagnosis not present

## 2015-11-02 DIAGNOSIS — R918 Other nonspecific abnormal finding of lung field: Secondary | ICD-10-CM | POA: Diagnosis not present

## 2015-11-02 DIAGNOSIS — K769 Liver disease, unspecified: Secondary | ICD-10-CM

## 2015-11-02 MED ORDER — IOPAMIDOL (ISOVUE-300) INJECTION 61%
100.0000 mL | Freq: Once | INTRAVENOUS | Status: AC | PRN
  Administered 2015-11-02: 100 mL via INTRAVENOUS

## 2015-11-02 NOTE — Progress Notes (Signed)
Quick Note:  2 lesions in liver - cannot tell what they are - most often benign but needs an MR liver with and witout contrast to get more info Please schedule ______

## 2015-11-09 ENCOUNTER — Encounter: Payer: Self-pay | Admitting: Family Medicine

## 2015-11-13 ENCOUNTER — Ambulatory Visit (HOSPITAL_COMMUNITY)
Admission: RE | Admit: 2015-11-13 | Discharge: 2015-11-13 | Disposition: A | Discharge status: Home / Self Care | Payer: MEDICARE | Source: Ambulatory Visit | Attending: Internal Medicine | Admitting: Internal Medicine

## 2015-11-13 DIAGNOSIS — K769 Liver disease, unspecified: Secondary | ICD-10-CM | POA: Diagnosis not present

## 2015-11-13 DIAGNOSIS — K7689 Other specified diseases of liver: Secondary | ICD-10-CM | POA: Diagnosis not present

## 2015-11-13 DIAGNOSIS — C189 Malignant neoplasm of colon, unspecified: Secondary | ICD-10-CM | POA: Diagnosis not present

## 2015-11-13 DIAGNOSIS — K6389 Other specified diseases of intestine: Secondary | ICD-10-CM | POA: Insufficient documentation

## 2015-11-13 MED ORDER — GADOBENATE DIMEGLUMINE 529 MG/ML IV SOLN
10.0000 mL | Freq: Once | INTRAVENOUS | Status: AC | PRN
  Administered 2015-11-13: 10 mL via INTRAVENOUS

## 2015-11-13 NOTE — Progress Notes (Signed)
Quick Note:  The liver lesions are tiny and nonspecific x 2 and other one looks like hemangioma - benign cluster of blood vessels.  Cannot know for sure re: what the two tiny lesions are but so far this is good news.  Proceed with surgical consult   ______

## 2015-11-14 ENCOUNTER — Other Ambulatory Visit: Payer: Self-pay | Admitting: Family Medicine

## 2015-11-16 ENCOUNTER — Other Ambulatory Visit: Payer: Self-pay | Admitting: Family Medicine

## 2015-11-17 ENCOUNTER — Other Ambulatory Visit: Payer: Self-pay | Admitting: Family Medicine

## 2015-11-17 MED ORDER — ESCITALOPRAM OXALATE 20 MG PO TABS: ORAL_TABLET | ORAL | Status: DC

## 2015-11-20 ENCOUNTER — Other Ambulatory Visit: Payer: Self-pay | Admitting: General Surgery

## 2015-11-20 ENCOUNTER — Telehealth: Payer: Self-pay | Admitting: Internal Medicine

## 2015-11-20 DIAGNOSIS — D125 Benign neoplasm of sigmoid colon: Secondary | ICD-10-CM | POA: Diagnosis not present

## 2015-11-20 NOTE — H&P (Signed)
Beth Maldonado 11/20/2015 9:13 AM Location: [facility] Patient #: [id number] DOB: [birth year] Married / Language: English / Race: Black or African American Female  History of Present Illness Beth Ruff MD; [id number] 12:03 PM) Patient words: colon mass.  The patient is a 70 year old female who presents with a colonic mass. 70 year old female who recently underwent her first screening colonoscopy with Dr. Carlean Maldonado. She was found to have a sigmoid colon mass concerning for carcinoma. Biopsies show only adenomatous tissue with no dysplasia or malignancy. The area was tattooed. She denies any blood per rectum. She denies any weight loss. She is having regular bowel function but does have some intermittent pelvic pain. Patient has a history of a open hysterectomy.   Other Problems Beth Lorenzo, LPN; 075-GRM [id number] AM) Back Pain Colon Cancer Diabetes Mellitus Hemorrhoids  Past Surgical History Beth Lorenzo, LPN; 075-GRM [id number] AM) Colon Polyp Removal - Colonoscopy Hemorrhoidectomy Hysterectomy (not due to cancer) - Partial Knee Surgery Bilateral. Mammoplasty; Reduction Bilateral.  Diagnostic Studies History Beth Lorenzo, LPN; 075-GRM [id number] AM) Colonoscopy within last year Mammogram within last year Pap Smear 1-5 years ago  Allergies Beth Lorenzo, LPN; 075-GRM [id number] AM) Penicillins Swelling. tongue  Medication History Beth Lorenzo, LPN; 075-GRM 579FGE AM) Atorvastatin Calcium (10MG  Tablet, Oral) Active. Cyclobenzaprine HCl (5MG  Tablet, Oral) Active. Escitalopram Oxalate (20MG  Tablet, Oral) Active. Gabapentin (300MG  Capsule, Oral) Active. HumaLOG KwikPen (100UNIT/ML Soln Pen-inj, Subcutaneous) Active. HydroCHLOROthiazide (25MG  Tablet, Oral) Active. HydrOXYzine HCl (25MG  Tablet, Oral) Active. MetFORMIN HCl (500MG  Tablet, Oral) Active. Naproxen (500MG  Tablet, Oral) Active. OneTouch Verio (In Vitro) Active. Toujeo SoloStar  (300UNIT/ML Soln Pen-inj, Subcutaneous) Active. Triamterene-HCTZ (37.5-25MG  Tablet, Oral) Active. Aspirin (81MG  Tablet DR, Oral) Active. Oxycodone-Acetaminophen (5-325MG  Tablet, Oral) Active. Zostavax (19400UNT/0.65ML For Solution, Subcutaneous) Active. Multivitamin Adult (Oral) Active. Losartan Potassium (100MG  Tablet, Oral) Active. Medications Reconciled  Social History Beth Lorenzo, LPN; 075-GRM [id number] AM) Alcohol use Occasional alcohol use. No drug use Tobacco use Former smoker.  Family History Beth Lorenzo, LPN; 075-GRM [id number] AM) Cancer Sister. Diabetes Mellitus Sister. Heart Disease Father. Heart disease in female family member before age 66 Kidney Disease Sister. Migraine Headache Sister. Thyroid problems Sister.  Pregnancy / Birth History Beth Lorenzo, LPN; 075-GRM [id number] AM) Age at menarche 88 years. Gravida 1 Irregular periods Maternal age 78-25 Para 1     Review of Systems Beth Lorenzo LPN; 075-GRM [id number] AM) General Present- Fatigue and Night Sweats. Not Present- Appetite Loss, Chills, Fever, Weight Gain and Weight Loss. Skin Present- Dryness. Not Present- Change in Wart/Mole, Hives, Jaundice, New Lesions, Non-Healing Wounds, Rash and Ulcer. HEENT Present- Ringing in the Ears and Seasonal Allergies. Not Present- Earache, Hearing Loss, Hoarseness, Nose Bleed, Oral Ulcers, Sinus Pain, Sore Throat, Visual Disturbances, Wears glasses/contact lenses and Yellow Eyes. Respiratory Present- Wheezing. Not Present- Bloody sputum, Chronic Cough, Difficulty Breathing and Snoring. Breast Not Present- Breast Mass, Breast Pain, Nipple Discharge and Skin Changes. Cardiovascular Present- Shortness of Breath. Not Present- Chest Pain, Difficulty Breathing Lying Down, Leg Cramps, Palpitations, Rapid Heart Rate and Swelling of Extremities. Gastrointestinal Present- Abdominal Pain and Hemorrhoids. Not Present- Bloating, Bloody Stool, Change in Bowel Habits,  Chronic diarrhea, Constipation, Difficulty Swallowing, Excessive gas, Gets full quickly at meals, Indigestion, Nausea, Rectal Pain and Vomiting. Female Genitourinary Not Present- Frequency, Nocturia, Painful Urination, Pelvic Pain and Urgency. Musculoskeletal Present- Back Pain and Muscle Pain. Not Present- Joint Pain, Joint Stiffness, Muscle Weakness and Swelling of Extremities. Endocrine Present- Hot flashes. Not Present- Cold Intolerance, Excessive Hunger, Hair Changes, Heat Intolerance  and New Diabetes.  Vitals Beth Billings Dockery LPN; 075-GRM [id number] AM) 11/20/2015 9:13 AM Weight: 190.8 lb Height: 61.5in Body Surface Area: 1.86 m Body Mass Index: 35.47 kg/m  Temp.: 98.5F(Oral)  Pulse: 82 (Regular)  BP: 140/86 (Sitting, Left Arm, Standard)      Physical Exam Beth Ruff MD; [id number] 12:04 PM)  General Mental Status-Alert. General Appearance-Not in acute distress. Build & Nutrition-Well nourished. Posture-Normal posture. Gait-Normal.  Head and Neck Head-normocephalic, atraumatic with no lesions or palpable masses. Trachea-midline.  Chest and Lung Exam Chest and lung exam reveals -on auscultation, normal breath sounds, no adventitious sounds and normal vocal resonance.  Cardiovascular Cardiovascular examination reveals -normal heart sounds, regular rate and rhythm with no murmurs.  Abdomen Inspection Inspection of the abdomen reveals - No Hernias. Incisional scars - Abdominal hysterectomy scar. Palpation/Percussion Palpation and Percussion of the abdomen reveal - Soft, Non Tender, No Rigidity (guarding), No hepatosplenomegaly and No Palpable abdominal masses.  Neurologic Neurologic evaluation reveals -alert and oriented x 3 with no impairment of recent or remote memory, normal attention span and ability to concentrate, normal sensation and normal coordination.  Musculoskeletal Normal Exam - Bilateral-Upper Extremity Strength Normal and  Lower Extremity Strength Normal.    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 CT scan shows some small liver lesions as well. On MRI these are relatively benign appearing. Follow-up was recommended.  CEA was 2.9.   I have recommended a robotic partial colectomy. It appears the lesion is in her proximal sigmoid on CT scan. We have discussed this in detail. All questions were answered. The surgery and anatomy were described to the patient as well as the risks of surgery and the possible complications. These include: Bleeding, deep abdominal infections and possible wound complications such as hernia and infection, damage to adjacent structures, leak of surgical connections, which can lead to other surgeries and possibly an ostomy, possible need for other procedures, such as abscess drains in radiology, possible prolonged hospital stay, possible diarrhea from removal of part of the colon, possible constipation from narcotics, possible bowel, bladder or sexual dysfunction if having rectal surgery, prolonged fatigue/weakness or appetite loss, possible early recurrence of of disease, possible complications of their medical problems such as heart disease or arrhythmias or lung problems, death (less than 1%). I believe the patient understands and wishes to proceed with the surgery.  Current Plans Pt Education - [facility] Colon Bowel Prep 2015 Miralax/Antibiotics Started Neomycin Sulfate 500MG , 2 (two) Tablet SEE NOTE, #6, 11/20/2015, No Refill. Local Order: TAKE TWO TABLETS AT 2 PM, 3 PM, AND 10 PM THE DAY PRIOR TO SURGERY Started Flagyl 500MG , 2 (two) Tablet SEE NOTE, #6, 11/20/2015, No Refill. Local Order: Take at 2pm, 3pm, and 10pm the day prior to your colon operation

## 2015-11-20 NOTE — Telephone Encounter (Signed)
Patient had an appt with [facility] Dr. Marcello Moores this am.  She wants to see another surgeon in the group or outside.  She doesn't feel Dr. Marcello Moores had reviewed any of her records prior to seeing her and did not have any bedside manner.  Patient does not feel comfortable returning to her and would like to know who Dr. Carlean Purl would recommend that she see as an alternative.

## 2015-11-22 NOTE — Telephone Encounter (Signed)
I spoke to her and will get her a different surgeon   also having abdominal soreness since colonoscopy I told her to come see me 315 Fr 4/28 - please open that up

## 2015-11-22 NOTE — Telephone Encounter (Signed)
Patient added on for 11/24/15 per Dr Carlean Purl.

## 2015-11-24 ENCOUNTER — Ambulatory Visit: Payer: MEDICARE | Admitting: Internal Medicine

## 2015-11-27 ENCOUNTER — Telehealth: Payer: Self-pay | Admitting: Internal Medicine

## 2015-11-27 NOTE — Telephone Encounter (Signed)
I have explained things to  Dr. Marcello Moores and would like to se if Dr. Alphonsa Overall could see her. Please call [facility] and see if we can arrange that. Let me know

## 2015-11-27 NOTE — Telephone Encounter (Signed)
Patient is seeing Dr Lucia Gaskins at 11:00AM tomorrow (May 2nd).

## 2015-11-27 NOTE — Telephone Encounter (Signed)
Dr Carlean Purl have you had a chance to work on this?

## 2015-11-28 DIAGNOSIS — D125 Benign neoplasm of sigmoid colon: Secondary | ICD-10-CM | POA: Diagnosis not present

## 2015-11-28 DIAGNOSIS — E109 Type 1 diabetes mellitus without complications: Secondary | ICD-10-CM | POA: Diagnosis not present

## 2015-12-07 ENCOUNTER — Other Ambulatory Visit: Payer: Self-pay | Admitting: Family Medicine

## 2015-12-08 ENCOUNTER — Other Ambulatory Visit: Payer: Self-pay | Admitting: Surgery

## 2015-12-08 NOTE — Patient Instructions (Addendum)
SANTANA MAYORQUIN  12/08/2015   Your procedure is scheduled on: 12-18-15  Report to [facility]  Entrance take [facility]  elevators to 3rd floor to  [place] at 815  AM.  Call this number if you have problems the morning of surgery [phone]   Remember: ONLY 1 PERSON MAY GO WITH YOU TO SHORT STAY TO GET  READY MORNING OF [place].  Do not eat food  :After Midnight Saturday night follow clear liquids all day Sunday 12-17-15 from dr Lucia Gaskins instructions, remember to drink plenty of clear liquids with bowel prep    Take these medicines the morning of surgery with A SIP OF WATER: ATORVASTATIN (LIPITOR), EXCITALOPRAM (LEXAPRO), GABAPENTIN (NEURONTIN), TAKE 1/2 DOSE OF TOUJEO INSULIN AT BEDTIME 12-17-15 DO NOT TAKE ANY DIABETIC MEDICATIONS DAY OF YOUR SURGERY                               You may not have any metal on your body including hair pins and              piercings  Do not wear jewelry, make-up, lotions, powders or perfumes, deodorant             Do not wear nail polish.  Do not shave  48 hours prior to surgery.              Men may shave face and neck.   Do not bring valuables to the hospital. [place].  Contacts, dentures or bridgework may not be worn into surgery.  Leave suitcase in the car. After surgery it may be brought to your room.     _____________________________________________________________________             [facility] - Preparing for Surgery Before surgery, you can play an important role.  Because skin is not sterile, your skin needs to be as free of germs as possible.  You can reduce the number of germs on your skin by washing with CHG (chlorahexidine gluconate) soap before surgery.  CHG is an antiseptic cleaner which kills germs and bonds with the skin to continue killing germs even after washing. Please DO NOT use if you have an allergy to CHG or antibacterial soaps.  If your  skin becomes reddened/irritated stop using the CHG and inform your nurse when you arrive at Short Stay. Do not shave (including legs and underarms) for at least 48 hours prior to the first CHG shower.  You may shave your face/neck. Please follow these instructions carefully:  1.  Shower with CHG Soap the night before surgery and the  morning of Surgery.  2.  If you choose to wash your hair, wash your hair first as usual with your  normal  shampoo.  3.  After you shampoo, rinse your hair and body thoroughly to remove the  shampoo.                           4.  Use CHG as you would any other liquid soap.  You can apply chg directly  to the skin and wash  Gently with a scrungie or clean washcloth.  5.  Apply the CHG Soap to your body ONLY FROM THE NECK DOWN.   Do not use on face/ open                           Wound or open sores. Avoid contact with eyes, ears mouth and genitals (private parts).                       Wash face,  Genitals (private parts) with your normal soap.             6.  Wash thoroughly, paying special attention to the area where your surgery  will be performed.  7.  Thoroughly rinse your body with warm water from the neck down.  8.  DO NOT shower/wash with your normal soap after using and rinsing off  the CHG Soap.                9.  Pat yourself dry with a clean towel.            10.  Wear clean pajamas.            11.  Place clean sheets on your bed the night of your first shower and do not  sleep with pets. Day of Surgery : Do not apply any lotions/deodorants the morning of surgery.  Please wear clean clothes to the hospital/surgery center.  FAILURE TO FOLLOW THESE INSTRUCTIONS MAY RESULT IN THE CANCELLATION OF YOUR SURGERY PATIENT SIGNATURE_________________________________  NURSE SIGNATURE__________________________________  ________________________________________________________________________    CLEAR LIQUID DIET   Foods Allowed                                                                      Foods Excluded  Coffee and tea, regular and decaf                             liquids that you cannot  Plain Jell-O in any flavor                                             see through such as: Fruit ices (not with fruit pulp)                                     milk, soups, orange juice  Iced Popsicles                                    All solid food Carbonated beverages, regular and diet                                    Cranberry, grape and apple juices Sports drinks like Gatorade Lightly seasoned clear broth or consume(fat free) Sugar, honey syrup  Sample Menu Breakfast                                Lunch                                     Supper Cranberry juice                    Beef broth                            Chicken broth Jell-O                                     Grape juice                           Apple juice Coffee or tea                        Jell-O                                      Popsicle                                                Coffee or tea                        Coffee or tea  _____________________________________________________________________

## 2015-12-12 ENCOUNTER — Encounter (HOSPITAL_COMMUNITY)
Admission: RE | Admit: 2015-12-12 | Discharge: 2015-12-12 | Disposition: A | Discharge status: Home / Self Care | Payer: MEDICARE | Source: Ambulatory Visit | Attending: Surgery | Admitting: Surgery

## 2015-12-12 ENCOUNTER — Encounter (HOSPITAL_COMMUNITY): Payer: Self-pay

## 2015-12-12 ENCOUNTER — Other Ambulatory Visit: Payer: Self-pay | Admitting: Family Medicine

## 2015-12-12 DIAGNOSIS — K6389 Other specified diseases of intestine: Secondary | ICD-10-CM | POA: Insufficient documentation

## 2015-12-12 DIAGNOSIS — Z01810 Encounter for preprocedural cardiovascular examination: Secondary | ICD-10-CM | POA: Insufficient documentation

## 2015-12-12 DIAGNOSIS — Z01812 Encounter for preprocedural laboratory examination: Secondary | ICD-10-CM | POA: Insufficient documentation

## 2015-12-12 HISTORY — DX: Tinnitus, unspecified ear: H93.19

## 2015-12-12 HISTORY — DX: Dorsalgia, unspecified: M54.9

## 2015-12-12 LAB — COMPREHENSIVE METABOLIC PANEL
ALT: 20 U/L (ref 14–54)
AST: 22 U/L (ref 15–41)
Albumin: 4.5 g/dL (ref 3.5–5.0)
Alkaline Phosphatase: 64 U/L (ref 38–126)
Anion gap: 10 (ref 5–15)
BUN: 24 mg/dL — AB (ref 6–20)
CHLORIDE: 103 mmol/L (ref 101–111)
CO2: 26 mmol/L (ref 22–32)
Calcium: 9.5 mg/dL (ref 8.9–10.3)
Creatinine, Ser: 1.32 mg/dL — ABNORMAL HIGH (ref 0.44–1.00)
GFR calc Af Amer: 47 mL/min — ABNORMAL LOW (ref >60)
GFR, EST NON AFRICAN AMERICAN: 40 mL/min — AB (ref >60)
Glucose, Bld: 99 mg/dL (ref 65–99)
Potassium: 3.5 mmol/L (ref 3.5–5.1)
Sodium: 139 mmol/L (ref 135–145)
Total Bilirubin: 0.9 mg/dL (ref 0.3–1.2)
Total Protein: 8.1 g/dL (ref 6.5–8.1)

## 2015-12-12 LAB — CBC WITH DIFFERENTIAL/PLATELET
BASOS ABS: 0.1 10*3/uL (ref 0.0–0.1)
BASOS PCT: 1 %
EOS ABS: 1.4 10*3/uL — AB (ref 0.0–0.7)
Eosinophils Relative: 13 %
HCT: 33.9 % — ABNORMAL LOW (ref 36.0–46.0)
Hemoglobin: 10.7 g/dL — ABNORMAL LOW (ref 12.0–15.0)
Lymphocytes Relative: 26 %
Lymphs Abs: 2.9 10*3/uL (ref 0.7–4.0)
MCH: 27.4 pg (ref 26.0–34.0)
MCHC: 31.6 g/dL (ref 30.0–36.0)
MCV: 86.7 fL (ref 78.0–100.0)
MONOS PCT: 7 %
Monocytes Absolute: 0.7 10*3/uL (ref 0.1–1.0)
NEUTROS PCT: 53 %
Neutro Abs: 6.2 10*3/uL (ref 1.7–7.7)
PLATELETS: 649 10*3/uL — AB (ref 150–400)
RBC: 3.91 MIL/uL (ref 3.87–5.11)
RDW: 15 % (ref 11.5–15.5)
WBC: 11.3 10*3/uL — AB (ref 4.0–10.5)

## 2015-12-12 LAB — ABO/RH: ABO/RH(D): AB POS

## 2015-12-12 NOTE — Progress Notes (Addendum)
SPOKE WITH DR Lissa Hoard ABOUT CHEST CT RESULTS 11-02-15, PT OK TO USE THESE CHEST CT RESULTS FOR SURGERY 12-18-15. PULMONARY FUNCTION TEST 04-27-14 EPIC

## 2015-12-12 NOTE — Progress Notes (Signed)
CMET RESULTS ROUTED TO DR NEWMAN INBASKET BY EPIC

## 2015-12-13 LAB — HEMOGLOBIN A1C
HEMOGLOBIN A1C: 7.5 % — AB (ref 4.8–5.6)
MEAN PLASMA GLUCOSE: 169 mg/dL

## 2015-12-17 NOTE — H&P (Signed)
Beth Maldonado Location: [facility] Patient #: [id number] DOB: [birth year] Married / Language: English / Race: Black or African American Female  History of Present Illness   Patient words: colon mass.   The patient is a 70 year old female who presents with a complaint of second opinion regarding colon mass.   Her PCP is dr. Frederik Pear  Her GI is Dr. Darci Current.  She is accompanied by her son, Hillard Danker.   Beth Maldonado had no prior history of gastrointestinal disease. Dr. Charlett Blake had pushed her to get a colonoscopy. The patient apparently had a positive Cologuard and this prompted her to go through the procedure. She denies a history of stomach disease, liver disease, pancreas disease. She's had no change in appetite, no change in bowels, no weight loss. She had no prior history of colonoscopy.  Dr. Carlean Purl did a colonoscopy on 27 October 2015 which revealed a proximal sigmoid lesion suspicious for cancer. Patient saw Dr. [name] for a mass found in the sigmoid colon on colonoscopy by Dr. Silvano Rusk. Biopsies showed an adenomatous tumor, no obvious cancer. The area was tattooed. The patient wanted a second opinion, so I agreed to see the patient to review her findings and surgical options.  She also had a CT scan of her abdomen on 6 of April 2017 which showed a 3 cm intraluminal mass in the mid sigmoid colon suspicious for colon cancer. There were 2 indeterminate liver lesions. An MRI on 13 November 2015 still showed nonspecific liver lesions and they suggested a 3 month follow-up. Her CEA is 2.9 on 10/27/2015. I gave the patient copies of her path report and CT and MRI scans.  Past Medical History: 1. DM x 15 years, on insulin the last 10  HgbA1c - 7.5 on 09/20/2015 2. HTN x 20 years 3. Hypercholesterolemia 4. 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 He back gives out at times 6. Hysterectomy in the 1970's 7. History of reduction  mammoplasty  Social History:  Married, husband at home. She is accompanied by her son, Hillard Danker. She is retired from [employer]   Allergies (Ammie Eversole, LPN; 579FGE D34-534 AM) Penicillins Swelling. tongue  Medication History (Ammie Eversole, LPN; 579FGE D34-534 AM) Neomycin Sulfate (500MG  Tablet, 2 (two) Tablet Oral SEE NOTE, Taken starting 11/20/2015) Active. (TAKE TWO TABLETS AT 2 PM, 3 PM, AND 10 PM THE DAY PRIOR TO SURGERY) Flagyl (500MG  Tablet, 2 (two) Tablet Oral SEE NOTE, Taken starting 11/20/2015) Active. (Take at 2pm, 3pm, and 10pm the day prior to your colon operation) Atorvastatin Calcium (10MG  Tablet, Oral) Active. Cyclobenzaprine HCl (5MG  Tablet, Oral) Active. Escitalopram Oxalate (20MG  Tablet, Oral) Active. Gabapentin (300MG  Capsule, Oral) Active. HumaLOG KwikPen (100UNIT/ML Soln Pen-inj, Subcutaneous) Active. HydroCHLOROthiazide (25MG  Tablet, Oral) Active. HydrOXYzine HCl (25MG  Tablet, Oral) Active. MetFORMIN HCl (500MG  Tablet, Oral) Active. Naproxen (500MG  Tablet, Oral) Active. OneTouch Verio (In Vitro) Active. Toujeo SoloStar (300UNIT/ML Soln Pen-inj, Subcutaneous) Active. Triamterene-HCTZ (37.5-25MG  Tablet, Oral) Active. Aspirin (81MG  Tablet DR, Oral) Active. Oxycodone-Acetaminophen (5-325MG  Tablet, Oral) Active. Zostavax (19400UNT/0.65ML For Solution, Subcutaneous) Active. Multivitamin Adult (Oral) Active. Losartan Potassium (100MG  Tablet, Oral) Active. Medications Reconciled  Vitals (Ammie Eversole LPN; 579FGE 624THL AM) 11/28/2015 11:17 AM Weight: 184.8 lb Height: 61.5in Body Surface Area: 1.84 m Body Mass Index: 34.35 kg/m  Temp.: 98.83F(Oral)  Pulse: 82 (Regular)  BP: 146/70 (Sitting, Left Arm, Standard)  Physical Exam  General: WN AA F alert and generally healthy appearing. HEENT: Normal. Pupils equal.  Neck:  Supple. No mass. No thyroid mass. Lymph Nodes: No supraclavicular or cervical  nodes.  Lungs: Clear to auscultation and symmetric breath sounds. Heart: RRR. No murmur or rub.  Abdomen: Soft. No mass. No tenderness. No hernia. Normal bowel sounds. Pfanansteil incision. She is more apple shaped that pear. Rectal: Not done.  Extremities: Good strength and ROM in upper and lower extremities.  Neurologic: Grossly intact to motor and sensory function. Psychiatric: Has normal mood and affect. Behavior is normal.  Assessment & Plan  1.  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 HTN x 20 years 4. Hypercholesterolemia 5. 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  BMI - 34   Alphonsa Overall, MD, [facility] Pager: [phone] Office phone:  [phone]

## 2015-12-18 ENCOUNTER — Inpatient Hospital Stay (HOSPITAL_COMMUNITY)
Admission: RE | Admit: 2015-12-18 | Discharge: 2015-12-21 | DRG: 330 | Disposition: A | Discharge status: Home / Self Care | Payer: MEDICARE | Source: Ambulatory Visit | Attending: Surgery | Admitting: Surgery

## 2015-12-18 ENCOUNTER — Inpatient Hospital Stay (HOSPITAL_COMMUNITY): Payer: MEDICARE | Admitting: Certified Registered"

## 2015-12-18 ENCOUNTER — Encounter (HOSPITAL_COMMUNITY)
Admission: RE | Disposition: A | Discharge status: Home / Self Care | Payer: Self-pay | Source: Ambulatory Visit | Attending: Surgery

## 2015-12-18 ENCOUNTER — Encounter (HOSPITAL_COMMUNITY): Payer: Self-pay

## 2015-12-18 DIAGNOSIS — D490 Neoplasm of unspecified behavior of digestive system: Secondary | ICD-10-CM | POA: Diagnosis not present

## 2015-12-18 DIAGNOSIS — I10 Essential (primary) hypertension: Secondary | ICD-10-CM | POA: Diagnosis present

## 2015-12-18 DIAGNOSIS — E7800 Pure hypercholesterolemia, unspecified: Secondary | ICD-10-CM | POA: Diagnosis present

## 2015-12-18 DIAGNOSIS — G629 Polyneuropathy, unspecified: Secondary | ICD-10-CM | POA: Diagnosis present

## 2015-12-18 DIAGNOSIS — Z794 Long term (current) use of insulin: Secondary | ICD-10-CM

## 2015-12-18 DIAGNOSIS — D62 Acute posthemorrhagic anemia: Secondary | ICD-10-CM | POA: Diagnosis not present

## 2015-12-18 DIAGNOSIS — C186 Malignant neoplasm of descending colon: Secondary | ICD-10-CM

## 2015-12-18 DIAGNOSIS — Z7982 Long term (current) use of aspirin: Secondary | ICD-10-CM | POA: Diagnosis not present

## 2015-12-18 DIAGNOSIS — K639 Disease of intestine, unspecified: Secondary | ICD-10-CM | POA: Diagnosis not present

## 2015-12-18 DIAGNOSIS — Z79899 Other long term (current) drug therapy: Secondary | ICD-10-CM

## 2015-12-18 DIAGNOSIS — E119 Type 2 diabetes mellitus without complications: Secondary | ICD-10-CM | POA: Diagnosis present

## 2015-12-18 DIAGNOSIS — C187 Malignant neoplasm of sigmoid colon: Secondary | ICD-10-CM | POA: Diagnosis not present

## 2015-12-18 HISTORY — DX: Malignant neoplasm of descending colon: C18.6

## 2015-12-18 HISTORY — PX: LAPAROSCOPIC SIGMOID COLECTOMY: SHX5928

## 2015-12-18 LAB — GLUCOSE, CAPILLARY
GLUCOSE-CAPILLARY: 188 mg/dL — AB (ref 65–99)
Glucose-Capillary: 114 mg/dL — ABNORMAL HIGH (ref 65–99)
Glucose-Capillary: 124 mg/dL — ABNORMAL HIGH (ref 65–99)
Glucose-Capillary: 193 mg/dL — ABNORMAL HIGH (ref 65–99)
Glucose-Capillary: 313 mg/dL — ABNORMAL HIGH (ref 65–99)

## 2015-12-18 SURGERY — COLECTOMY, SIGMOID, LAPAROSCOPIC
Anesthesia: General

## 2015-12-18 MED ORDER — LIP MEDEX EX OINT
TOPICAL_OINTMENT | CUTANEOUS | Status: DC | PRN
  Filled 2015-12-18: qty 7

## 2015-12-18 MED ORDER — POTASSIUM CHLORIDE IN NACL 20-0.45 MEQ/L-% IV SOLN
INTRAVENOUS | Status: DC
  Administered 2015-12-18: 16:00:00 via INTRAVENOUS
  Administered 2015-12-19: 125 mL/h via INTRAVENOUS
  Administered 2015-12-19 – 2015-12-21 (×4): via INTRAVENOUS
  Filled 2015-12-18 (×11): qty 1000

## 2015-12-18 MED ORDER — LACTATED RINGERS IV SOLN
INTRAVENOUS | Status: DC
  Administered 2015-12-18: 1000 mL via INTRAVENOUS
  Administered 2015-12-18: 11:00:00 via INTRAVENOUS

## 2015-12-18 MED ORDER — PROPOFOL 10 MG/ML IV BOLUS
INTRAVENOUS | Status: DC | PRN
  Administered 2015-12-18: 180 mg via INTRAVENOUS

## 2015-12-18 MED ORDER — MIDAZOLAM HCL 2 MG/2ML IJ SOLN
INTRAMUSCULAR | Status: AC
  Filled 2015-12-18: qty 2

## 2015-12-18 MED ORDER — DIPHENHYDRAMINE HCL 50 MG/ML IJ SOLN: 12.5000 mg | Freq: Four times a day (QID) | INTRAMUSCULAR | Status: DC | PRN

## 2015-12-18 MED ORDER — ENOXAPARIN SODIUM 40 MG/0.4ML ~~LOC~~ SOLN
40.0000 mg | Freq: Once | SUBCUTANEOUS | Status: AC
  Administered 2015-12-18: 40 mg via SUBCUTANEOUS
  Filled 2015-12-18: qty 0.4

## 2015-12-18 MED ORDER — CHLORHEXIDINE GLUCONATE 4 % EX LIQD: 60.0000 mL | Freq: Once | CUTANEOUS | Status: DC

## 2015-12-18 MED ORDER — EPHEDRINE SULFATE-NACL 50-0.9 MG/10ML-% IV SOSY
PREFILLED_SYRINGE | INTRAVENOUS | Status: DC | PRN
  Administered 2015-12-18: 5 mg via INTRAVENOUS
  Administered 2015-12-18: 10 mg via INTRAVENOUS

## 2015-12-18 MED ORDER — ROCURONIUM BROMIDE 50 MG/5ML IV SOLN
INTRAVENOUS | Status: AC
  Filled 2015-12-18: qty 1

## 2015-12-18 MED ORDER — DEXAMETHASONE SODIUM PHOSPHATE 10 MG/ML IJ SOLN
INTRAMUSCULAR | Status: DC | PRN
Start: 2015-12-18 — End: 2015-12-18
  Administered 2015-12-18: 10 mg via INTRAVENOUS

## 2015-12-18 MED ORDER — BUPIVACAINE-EPINEPHRINE 0.25% -1:200000 IJ SOLN
INTRAMUSCULAR | Status: AC
  Filled 2015-12-18: qty 1

## 2015-12-18 MED ORDER — DEXAMETHASONE SODIUM PHOSPHATE 10 MG/ML IJ SOLN
INTRAMUSCULAR | Status: AC
  Filled 2015-12-18: qty 1

## 2015-12-18 MED ORDER — EPHEDRINE SULFATE 50 MG/ML IJ SOLN: INTRAMUSCULAR | Status: DC | PRN

## 2015-12-18 MED ORDER — SODIUM CHLORIDE 0.9 % IJ SOLN
INTRAMUSCULAR | Status: AC
  Filled 2015-12-18: qty 20

## 2015-12-18 MED ORDER — SUGAMMADEX SODIUM 200 MG/2ML IV SOLN
INTRAVENOUS | Status: DC | PRN
  Administered 2015-12-18: 200 mg via INTRAVENOUS

## 2015-12-18 MED ORDER — BUPIVACAINE LIPOSOME 1.3 % IJ SUSP
20.0000 mL | Freq: Once | INTRAMUSCULAR | Status: AC
  Administered 2015-12-18: 20 mL
  Filled 2015-12-18: qty 20

## 2015-12-18 MED ORDER — BUPIVACAINE HCL 0.25 % IJ SOLN
INTRAMUSCULAR | Status: DC | PRN
  Administered 2015-12-18: 5 mL

## 2015-12-18 MED ORDER — ROCURONIUM BROMIDE 100 MG/10ML IV SOLN
INTRAVENOUS | Status: DC | PRN
  Administered 2015-12-18 (×3): 10 mg via INTRAVENOUS
  Administered 2015-12-18: 50 mg via INTRAVENOUS

## 2015-12-18 MED ORDER — CLINDAMYCIN PHOSPHATE 900 MG/50ML IV SOLN
INTRAVENOUS | Status: AC
  Filled 2015-12-18: qty 50

## 2015-12-18 MED ORDER — HYDROCODONE-ACETAMINOPHEN 5-325 MG PO TABS
1.0000 | ORAL_TABLET | ORAL | Status: DC | PRN
  Administered 2015-12-19 – 2015-12-20 (×3): 2 via ORAL
  Filled 2015-12-18: qty 1
  Filled 2015-12-18 (×2): qty 2
  Filled 2015-12-18: qty 1

## 2015-12-18 MED ORDER — ONDANSETRON HCL 4 MG/2ML IJ SOLN: 4.0000 mg | Freq: Four times a day (QID) | INTRAMUSCULAR | Status: DC | PRN

## 2015-12-18 MED ORDER — ONDANSETRON HCL 4 MG/2ML IJ SOLN
INTRAMUSCULAR | Status: AC
  Filled 2015-12-18: qty 2

## 2015-12-18 MED ORDER — PHENYLEPHRINE 40 MCG/ML (10ML) SYRINGE FOR IV PUSH (FOR BLOOD PRESSURE SUPPORT)
PREFILLED_SYRINGE | INTRAVENOUS | Status: DC | PRN
  Administered 2015-12-18 (×2): 80 ug via INTRAVENOUS
  Administered 2015-12-18: 40 ug via INTRAVENOUS

## 2015-12-18 MED ORDER — SODIUM CHLORIDE 0.9% FLUSH: 9.0000 mL | INTRAVENOUS | Status: DC | PRN

## 2015-12-18 MED ORDER — CEFOTETAN DISODIUM-DEXTROSE 2-2.08 GM-% IV SOLR
INTRAVENOUS | Status: AC
  Filled 2015-12-18: qty 50

## 2015-12-18 MED ORDER — SUGAMMADEX SODIUM 200 MG/2ML IV SOLN
INTRAVENOUS | Status: AC
  Filled 2015-12-18: qty 2

## 2015-12-18 MED ORDER — LIDOCAINE 2% (20 MG/ML) 5 ML SYRINGE
INTRAMUSCULAR | Status: DC | PRN
  Administered 2015-12-18: 80 mg via INTRAVENOUS

## 2015-12-18 MED ORDER — PROPOFOL 10 MG/ML IV BOLUS
INTRAVENOUS | Status: AC
  Filled 2015-12-18: qty 20

## 2015-12-18 MED ORDER — DEXTROSE 5 % IV SOLN: 2.0000 g | INTRAVENOUS | Status: DC

## 2015-12-18 MED ORDER — LACTATED RINGERS IR SOLN
Status: DC | PRN
  Administered 2015-12-18: 1000 mL

## 2015-12-18 MED ORDER — MIDAZOLAM HCL 5 MG/5ML IJ SOLN
INTRAMUSCULAR | Status: DC | PRN
  Administered 2015-12-18: 2 mg via INTRAVENOUS

## 2015-12-18 MED ORDER — HYDROMORPHONE HCL 1 MG/ML IJ SOLN
0.2500 mg | INTRAMUSCULAR | Status: DC | PRN
  Administered 2015-12-18 (×2): 0.25 mg via INTRAVENOUS
  Administered 2015-12-18: 0.5 mg via INTRAVENOUS

## 2015-12-18 MED ORDER — HEPARIN SODIUM (PORCINE) 5000 UNIT/ML IJ SOLN
5000.0000 [IU] | Freq: Three times a day (TID) | INTRAMUSCULAR | Status: DC
  Administered 2015-12-18 – 2015-12-21 (×8): 5000 [IU] via SUBCUTANEOUS
  Filled 2015-12-18 (×11): qty 1

## 2015-12-18 MED ORDER — FENTANYL CITRATE (PF) 100 MCG/2ML IJ SOLN
INTRAMUSCULAR | Status: DC | PRN
  Administered 2015-12-18 (×2): 50 ug via INTRAVENOUS
  Administered 2015-12-18: 100 ug via INTRAVENOUS
  Administered 2015-12-18: 50 ug via INTRAVENOUS

## 2015-12-18 MED ORDER — DEXTROSE 5 % IV SOLN
5.0000 mg/kg | INTRAVENOUS | Status: AC
  Administered 2015-12-18: 320 mg via INTRAVENOUS
  Filled 2015-12-18: qty 8

## 2015-12-18 MED ORDER — LIDOCAINE HCL (CARDIAC) 20 MG/ML IV SOLN
INTRAVENOUS | Status: AC
  Filled 2015-12-18: qty 5

## 2015-12-18 MED ORDER — SODIUM CHLORIDE 0.9 % IJ SOLN
INTRAMUSCULAR | Status: AC
  Filled 2015-12-18: qty 10

## 2015-12-18 MED ORDER — PHENYLEPHRINE 40 MCG/ML (10ML) SYRINGE FOR IV PUSH (FOR BLOOD PRESSURE SUPPORT)
PREFILLED_SYRINGE | INTRAVENOUS | Status: AC
  Filled 2015-12-18: qty 10

## 2015-12-18 MED ORDER — NALOXONE HCL 0.4 MG/ML IJ SOLN: 0.4000 mg | INTRAMUSCULAR | Status: DC | PRN

## 2015-12-18 MED ORDER — HYDROMORPHONE HCL 1 MG/ML IJ SOLN
INTRAMUSCULAR | Status: AC
  Filled 2015-12-18: qty 1

## 2015-12-18 MED ORDER — 0.9 % SODIUM CHLORIDE (POUR BTL) OPTIME
TOPICAL | Status: DC | PRN
  Administered 2015-12-18: 3000 mL

## 2015-12-18 MED ORDER — SODIUM CHLORIDE 0.9 % IJ SOLN
INTRAMUSCULAR | Status: AC
  Filled 2015-12-18: qty 30

## 2015-12-18 MED ORDER — DIPHENHYDRAMINE HCL 12.5 MG/5ML PO ELIX: 12.5000 mg | ORAL_SOLUTION | Freq: Four times a day (QID) | ORAL | Status: DC | PRN

## 2015-12-18 MED ORDER — MORPHINE SULFATE 2 MG/ML IV SOLN
INTRAVENOUS | Status: DC
  Administered 2015-12-18: 14:00:00 via INTRAVENOUS
  Administered 2015-12-18: 0 mg via INTRAVENOUS
  Administered 2015-12-19: 3 mg via INTRAVENOUS
  Administered 2015-12-19: 4.5 mg via INTRAVENOUS
  Administered 2015-12-19 – 2015-12-20 (×4): 1.5 mg via INTRAVENOUS
  Administered 2015-12-21: 3 mg via INTRAVENOUS
  Filled 2015-12-18: qty 25

## 2015-12-18 MED ORDER — CLINDAMYCIN PHOSPHATE 900 MG/50ML IV SOLN
900.0000 mg | INTRAVENOUS | Status: AC
  Administered 2015-12-18: 900 mg via INTRAVENOUS

## 2015-12-18 MED ORDER — EPHEDRINE SULFATE 50 MG/ML IJ SOLN
INTRAMUSCULAR | Status: AC
  Filled 2015-12-18: qty 1

## 2015-12-18 MED ORDER — ONDANSETRON HCL 4 MG PO TABS: 4.0000 mg | ORAL_TABLET | Freq: Four times a day (QID) | ORAL | Status: DC | PRN

## 2015-12-18 MED ORDER — SODIUM CHLORIDE 0.9 % IJ SOLN
INTRAMUSCULAR | Status: DC | PRN
  Administered 2015-12-18: 20 mL via INTRAVENOUS

## 2015-12-18 MED ORDER — FENTANYL CITRATE (PF) 250 MCG/5ML IJ SOLN
INTRAMUSCULAR | Status: AC
  Filled 2015-12-18: qty 5

## 2015-12-18 MED ORDER — ALVIMOPAN 12 MG PO CAPS
12.0000 mg | ORAL_CAPSULE | Freq: Two times a day (BID) | ORAL | Status: DC
  Administered 2015-12-19 – 2015-12-20 (×3): 12 mg via ORAL
  Filled 2015-12-18 (×6): qty 1

## 2015-12-18 MED ORDER — ALVIMOPAN 12 MG PO CAPS
12.0000 mg | ORAL_CAPSULE | Freq: Once | ORAL | Status: AC
  Administered 2015-12-18: 12 mg via ORAL
  Filled 2015-12-18: qty 1

## 2015-12-18 MED ORDER — INSULIN ASPART 100 UNIT/ML ~~LOC~~ SOLN
0.0000 [IU] | SUBCUTANEOUS | Status: DC
Start: 2015-12-18 — End: 2015-12-21
  Administered 2015-12-18: 15 [IU] via SUBCUTANEOUS
  Administered 2015-12-18: 3 [IU] via SUBCUTANEOUS
  Administered 2015-12-18: 4 [IU] via SUBCUTANEOUS
  Administered 2015-12-19: 3 [IU] via SUBCUTANEOUS
  Administered 2015-12-19 (×2): 4 [IU] via SUBCUTANEOUS
  Administered 2015-12-19 – 2015-12-20 (×2): 3 [IU] via SUBCUTANEOUS
  Administered 2015-12-20: 7 [IU] via SUBCUTANEOUS
  Administered 2015-12-21: 1 [IU] via SUBCUTANEOUS
  Administered 2015-12-21: 4 [IU] via SUBCUTANEOUS
  Administered 2015-12-21: 3 [IU] via SUBCUTANEOUS

## 2015-12-18 MED ORDER — ONDANSETRON HCL 4 MG/2ML IJ SOLN
INTRAMUSCULAR | Status: DC | PRN
  Administered 2015-12-18: 4 mg via INTRAVENOUS

## 2015-12-18 SURGICAL SUPPLY — 69 items
APPLIER CLIP 5 13 M/L LIGAMAX5 (MISCELLANEOUS)
APPLIER CLIP ROT 10 11.4 M/L (STAPLE)
BLADE EXTENDED COATED 6.5IN (ELECTRODE) IMPLANT
BLADE HEX COATED 2.75 (ELECTRODE) ×2 IMPLANT
CABLE HIGH FREQUENCY MONO STRZ (ELECTRODE) ×2 IMPLANT
CELLS DAT CNTRL 66122 CELL SVR (MISCELLANEOUS) IMPLANT
CLIP APPLIE 5 13 M/L LIGAMAX5 (MISCELLANEOUS) IMPLANT
CLIP APPLIE ROT 10 11.4 M/L (STAPLE) IMPLANT
COUNTER NEEDLE 20 DBL MAG RED (NEEDLE) ×2 IMPLANT
COVER MAYO STAND STRL (DRAPES) ×6 IMPLANT
COVER SURGICAL LIGHT HANDLE (MISCELLANEOUS) IMPLANT
DECANTER SPIKE VIAL GLASS SM (MISCELLANEOUS) IMPLANT
DRAIN CHANNEL 19F RND (DRAIN) IMPLANT
DRAPE LAPAROSCOPIC ABDOMINAL (DRAPES) ×2 IMPLANT
DRSG OPSITE POSTOP 4X10 (GAUZE/BANDAGES/DRESSINGS) IMPLANT
DRSG OPSITE POSTOP 4X6 (GAUZE/BANDAGES/DRESSINGS) ×2 IMPLANT
DRSG OPSITE POSTOP 4X8 (GAUZE/BANDAGES/DRESSINGS) IMPLANT
ELECT PENCIL ROCKER SW 15FT (MISCELLANEOUS) ×4 IMPLANT
ELECT REM PT RETURN 9FT ADLT (ELECTROSURGICAL) ×2
ELECTRODE REM PT RTRN 9FT ADLT (ELECTROSURGICAL) ×1 IMPLANT
EVACUATOR SILICONE 100CC (DRAIN) IMPLANT
GAUZE SPONGE 4X4 12PLY STRL (GAUZE/BANDAGES/DRESSINGS) IMPLANT
GLOVE BIO SURGEON STRL SZ7.5 (GLOVE) ×4 IMPLANT
GLOVE BIOGEL PI IND STRL 7.0 (GLOVE) ×12 IMPLANT
GLOVE BIOGEL PI IND STRL 8 (GLOVE) ×2 IMPLANT
GLOVE BIOGEL PI INDICATOR 7.0 (GLOVE) ×12
GLOVE BIOGEL PI INDICATOR 8 (GLOVE) ×2
GLOVE SURG SIGNA 7.5 PF LTX (GLOVE) ×4 IMPLANT
GOWN SPEC L4 XLG W/TWL (GOWN DISPOSABLE) IMPLANT
GOWN STRL REUS W/ TWL XL LVL3 (GOWN DISPOSABLE) ×11 IMPLANT
GOWN STRL REUS W/TWL XL LVL3 (GOWN DISPOSABLE) ×11
HANDLE SUCTION POOLE (INSTRUMENTS) ×1 IMPLANT
LEGGING LITHOTOMY PAIR STRL (DRAPES) ×2 IMPLANT
LIGASURE IMPACT 36 18CM CVD LR (INSTRUMENTS) IMPLANT
LIQUID BAND (GAUZE/BANDAGES/DRESSINGS) ×2 IMPLANT
PACK COLON (CUSTOM PROCEDURE TRAY) ×2 IMPLANT
RELOAD PROXIMATE 75MM BLUE (ENDOMECHANICALS) ×2 IMPLANT
RTRCTR WOUND ALEXIS 18CM MED (MISCELLANEOUS)
SCISSORS LAP 5X35 DISP (ENDOMECHANICALS) ×2 IMPLANT
SET IRRIG TUBING LAPAROSCOPIC (IRRIGATION / IRRIGATOR) ×2 IMPLANT
SHEARS HARMONIC ACE PLUS 36CM (ENDOMECHANICALS) IMPLANT
SHEARS HARMONIC HDI 36CM (ELECTROSURGICAL) ×2 IMPLANT
SLEEVE XCEL OPT CAN 5 100 (ENDOMECHANICALS) ×6 IMPLANT
SOLUTION ANTI FOG 6CC (MISCELLANEOUS) ×2 IMPLANT
STAPLER PROXIMATE 75MM BLUE (STAPLE) ×2 IMPLANT
STAPLER VISISTAT 35W (STAPLE) ×2 IMPLANT
SUCTION POOLE HANDLE (INSTRUMENTS) ×2
SUT MNCRL AB 4-0 PS2 18 (SUTURE) ×2 IMPLANT
SUT PDS AB 1 CTX 36 (SUTURE) ×2 IMPLANT
SUT PDS AB 1 TP1 96 (SUTURE) IMPLANT
SUT PROLENE 2 0 KS (SUTURE) IMPLANT
SUT SILK 2 0 (SUTURE) ×1
SUT SILK 2 0 SH CR/8 (SUTURE) ×12 IMPLANT
SUT SILK 2-0 18XBRD TIE 12 (SUTURE) ×1 IMPLANT
SUT SILK 3 0 (SUTURE) ×1
SUT SILK 3 0 SH CR/8 (SUTURE) ×2 IMPLANT
SUT SILK 3-0 18XBRD TIE 12 (SUTURE) ×1 IMPLANT
SUT VIC AB 2-0 SH 18 (SUTURE) IMPLANT
SUT VIC AB 3-0 SH 18 (SUTURE) ×2 IMPLANT
SUT VICRYL 2 0 18  UND BR (SUTURE)
SUT VICRYL 2 0 18 UND BR (SUTURE) IMPLANT
SYS LAPSCP GELPORT 120MM (MISCELLANEOUS) ×2
SYSTEM LAPSCP GELPORT 120MM (MISCELLANEOUS) ×1 IMPLANT
TOWEL OR NON WOVEN STRL DISP B (DISPOSABLE) IMPLANT
TRAY FOLEY W/METER SILVER 14FR (SET/KITS/TRAYS/PACK) ×2 IMPLANT
TRAY FOLEY W/METER SILVER 16FR (SET/KITS/TRAYS/PACK) IMPLANT
TROCAR BLADELESS OPT 5 100 (ENDOMECHANICALS) ×2 IMPLANT
TROCAR XCEL NON-BLD 11X100MML (ENDOMECHANICALS) IMPLANT
TUBING INSUF HEATED (TUBING) ×2 IMPLANT

## 2015-12-18 NOTE — Interval H&P Note (Signed)
History and Physical Interval Note:  12/18/2015 9:21 AM  Beth Maldonado  has presented today for surgery, with the diagnosis of Sigmoid colon tumor   The various methods of treatment have been discussed with the patient and family.   Her husband is with her.  After consideration of risks, benefits and other options for treatment, the patient has consented to  Procedure(s): LAPAROSCOPIC SIGMOID COLECTOMY (N/A) as a surgical intervention .  [name] history has been reviewed, patient examined, no change in status, stable for surgery.  I have reviewed the patient's chart and labs.  Questions were answered to the patient's satisfaction.     Jamilya Sarrazin H

## 2015-12-18 NOTE — Anesthesia Postprocedure Evaluation (Signed)
Anesthesia Post Note  Patient: Beth Maldonado  Procedure(s) Performed: Procedure(s) (LRB): LAPAROSCOPIC SIGMOID COLECTOMY (N/A)  Patient location during evaluation: PACU Anesthesia Type: General Level of consciousness: awake and alert Pain management: pain level controlled Vital Signs Assessment: post-procedure vital signs reviewed and stable Respiratory status: spontaneous breathing, nonlabored ventilation, respiratory function stable and patient connected to nasal cannula oxygen Cardiovascular status: blood pressure returned to baseline and stable Postop Assessment: no signs of nausea or vomiting Anesthetic complications: no    Last Vitals:  Filed Vitals:   12/18/15 1400 12/18/15 1423  BP: 118/63 134/61  Pulse: 88 85  Temp: 36.6 C 36.9 C  Resp: 19 21    Last Pain:  Filed Vitals:   12/18/15 1434  PainSc: Asleep                 Kishaun Erekson,JAMES TERRILL

## 2015-12-18 NOTE — Anesthesia Procedure Notes (Signed)
Procedure Name: Intubation Date/Time: 12/18/2015 10:02 AM Performed by: Lajuana Carry E Pre-anesthesia Checklist: Patient identified, Emergency Drugs available, Suction available and Patient being monitored Patient Re-evaluated:Patient Re-evaluated prior to inductionOxygen Delivery Method: Circle System Utilized Preoxygenation: Pre-oxygenation with 100% oxygen Intubation Type: IV induction Ventilation: Mask ventilation without difficulty Laryngoscope Size: Mac and 3 Grade View: Grade II Tube type: Oral Tube size: 7.0 mm Number of attempts: 1 Airway Equipment and Method: Stylet and Oral airway Placement Confirmation: ETT inserted through vocal cords under direct vision,  positive ETCO2 and breath sounds checked- equal and bilateral Secured at: 23 cm Tube secured with: Tape Dental Injury: Teeth and Oropharynx as per pre-operative assessment

## 2015-12-18 NOTE — Transfer of Care (Signed)
Immediate Anesthesia Transfer of Care Note  Patient: Beth Maldonado  Procedure(s) Performed: Procedure(s): LAPAROSCOPIC SIGMOID COLECTOMY (N/A)  Patient Location: PACU  Anesthesia Type:General  Level of Consciousness:  sedated, patient cooperative and responds to stimulation  Airway & Oxygen Therapy:Patient Spontanous Breathing and Patient connected to face mask oxgen  Post-op Assessment:  Report given to PACU RN and Post -op Vital signs reviewed and stable  Post vital signs:  Reviewed and stable  Last Vitals:  Filed Vitals:   12/18/15 0758  BP: 138/64  Pulse: 85  Temp: 36.7 C  Resp: 16    Complications: No apparent anesthesia complications

## 2015-12-18 NOTE — Anesthesia Preprocedure Evaluation (Addendum)
Anesthesia Evaluation  Patient identified by MRN, date of birth, ID band Patient awake    Reviewed: Allergy & Precautions, NPO status , Patient's Chart, lab work & pertinent test results  Airway Mallampati: II  TM Distance: >3 FB Neck ROM: Full    Dental  (+) Teeth Intact   Pulmonary shortness of breath, former smoker,    breath sounds clear to auscultation       Cardiovascular hypertension,  Rhythm:Regular Rate:Normal + Systolic murmurs    Neuro/Psych  Neuromuscular disease    GI/Hepatic negative GI ROS, Neg liver ROS,   Endo/Other  diabetes  Renal/GU      Musculoskeletal  (+) Arthritis ,   Abdominal   Peds  Hematology   Anesthesia Other Findings   Reproductive/Obstetrics                            Anesthesia Physical Anesthesia Plan  ASA: III  Anesthesia Plan: General   Post-op Pain Management:    Induction: Intravenous  Airway Management Planned: Oral ETT  Additional Equipment:   Intra-op Plan:   Post-operative Plan: Extubation in OR  Informed Consent: I have reviewed the patients History and Physical, chart, labs and discussed the procedure including the risks, benefits and alternatives for the proposed anesthesia with the patient or authorized representative who has indicated his/her understanding and acceptance.   Dental advisory given  Plan Discussed with: CRNA and Surgeon  Anesthesia Plan Comments:         Anesthesia Quick Evaluation

## 2015-12-19 LAB — CBC
HCT: 27.8 % — ABNORMAL LOW (ref 36.0–46.0)
Hemoglobin: 9 g/dL — ABNORMAL LOW (ref 12.0–15.0)
MCH: 28 pg (ref 26.0–34.0)
MCHC: 32.4 g/dL (ref 30.0–36.0)
MCV: 86.6 fL (ref 78.0–100.0)
PLATELETS: 537 10*3/uL — AB (ref 150–400)
RBC: 3.21 MIL/uL — AB (ref 3.87–5.11)
RDW: 15.5 % (ref 11.5–15.5)
WBC: 17 10*3/uL — ABNORMAL HIGH (ref 4.0–10.5)

## 2015-12-19 LAB — BASIC METABOLIC PANEL
Anion gap: 6 (ref 5–15)
BUN: 17 mg/dL (ref 6–20)
CALCIUM: 8 mg/dL — AB (ref 8.9–10.3)
CO2: 27 mmol/L (ref 22–32)
CREATININE: 1.38 mg/dL — AB (ref 0.44–1.00)
Chloride: 105 mmol/L (ref 101–111)
GFR calc non Af Amer: 38 mL/min — ABNORMAL LOW (ref >60)
GFR, EST AFRICAN AMERICAN: 44 mL/min — AB (ref >60)
Glucose, Bld: 106 mg/dL — ABNORMAL HIGH (ref 65–99)
Potassium: 3.4 mmol/L — ABNORMAL LOW (ref 3.5–5.1)
SODIUM: 138 mmol/L (ref 135–145)

## 2015-12-19 LAB — TYPE AND SCREEN
ABO/RH(D): AB POS
ANTIBODY SCREEN: NEGATIVE

## 2015-12-19 LAB — GLUCOSE, CAPILLARY
GLUCOSE-CAPILLARY: 102 mg/dL — AB (ref 65–99)
GLUCOSE-CAPILLARY: 139 mg/dL — AB (ref 65–99)
Glucose-Capillary: 127 mg/dL — ABNORMAL HIGH (ref 65–99)
Glucose-Capillary: 141 mg/dL — ABNORMAL HIGH (ref 65–99)
Glucose-Capillary: 164 mg/dL — ABNORMAL HIGH (ref 65–99)
Glucose-Capillary: 173 mg/dL — ABNORMAL HIGH (ref 65–99)

## 2015-12-19 NOTE — Progress Notes (Addendum)
[facility]:  [phone] General Surgery Progress Note   LOS: 1 day  POD -  1 Day Post-Op  Assessment/Plan: 1.  LAPAROSCOPIC assisted Left COLECTOMY  Doing okay.  Will start sips from the floor.  2. DM x 15 years, on insulin the last 10 HgbA1c - 7.5 on 09/20/2015 3. HTN x 20 years 4. Hypercholesterolemia 5. 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 He back gives out at times 7.  DVT prophylaxis - SQ Heparin 8.  Anemia  Hgb - 9.0 - 12/19/2015 - recheck labs tomorrow 9.  Creatinine mildly elevated   Active Problems:   Cancer of left colon (HCC)   Subjective:  Doing okay.  Sore.  Son in room.  Objective:   Filed Vitals:   12/19/15 0745 12/19/15 1030  BP:  102/54  Pulse:  77  Temp:  97.8 F (36.6 C)  Resp: 19 18     Intake/Output from previous day:  05/22 0701 - 05/23 0700 In: 3283 [I.V.:3175; IV Piggyback:108] Out: 1125 [Urine:975; Blood:150]  Intake/Output this shift:  Total I/O In: -  Out: 400 [Urine:400]   Physical Exam:   General: Mildly obese AA F who is alert and oriented.    HEENT: Normal. Pupils equal. .   Lungs: Clear.  She pulled 1,200 cc on IS.   Abdomen: Soft.   Quiet   Wound: Okay.   Lab Results:    Recent Labs  12/19/15 0428  WBC 17.0*  HGB 9.0*  HCT 27.8*  PLT 537*    BMET   Recent Labs  12/19/15 0428  NA 138  K 3.4*  CL 105  CO2 27  GLUCOSE 106*  BUN 17  CREATININE 1.38*  CALCIUM 8.0*    PT/INR  No results for input(s): LABPROT, INR in the last 72 hours.  ABG  No results for input(s): PHART, HCO3 in the last 72 hours.  Invalid input(s): PCO2, PO2   Studies/Results:  No results found.   Anti-infectives:   Anti-infectives    Start     Dose/Rate Route Frequency Ordered Stop   12/18/15 0902  cefoTEtan (CEFOTAN) 2 g in dextrose 5 % 50 mL IVPB  Status:  Discontinued     2 g 100 mL/hr over 30 Minutes Intravenous On call to O.R. 12/18/15 0902 12/18/15 0905   12/18/15 0758   clindamycin (CLEOCIN) IVPB 900 mg     900 mg 100 mL/hr over 30 Minutes Intravenous 60 min pre-op 12/18/15 0758 12/18/15 1052   12/18/15 0758  gentamicin (GARAMYCIN) 320 mg in dextrose 5 % 100 mL IVPB     5 mg/kg  63.3 kg (Adjusted) 108 mL/hr over 60 Minutes Intravenous 60 min pre-op 12/18/15 0758 12/18/15 1018      Alphonsa Overall, MD, FACS Pager: [facility]: [phone] 12/19/2015

## 2015-12-19 NOTE — Op Note (Addendum)
Beth Maldonado, Beth Maldonado                ACCOUNT NO.:  [id number]  MEDICAL RECORD NO.:  [id number]  LOCATION:  74                         FACILITY:  [facility]  PHYSICIAN:  Fenton Maldonado. Beth Maldonado, M.D.  DATE OF BIRTH:  [birth year]  DATE OF PROCEDURE:  12/18/2015                              OPERATIVE REPORT   PREOPERATIVE DIAGNOSIS:  Tumor of proximal sigmoid colon.  POSTOPERATIVE DIAGNOSIS:  Tumor of distal left colon, probable carcinoma.  OPERATION PERFORMED:  Laparoscopic assisted resection of left colon and proximal sigmoid colon, mobilization of the splenic flexure.  SURGEON:  Fenton Maldonado. Beth Maldonado, M.D.  FIRST ASSISTANT:  Beth Maldonado. Beth Pulling, MD, FACS  ANESTHESIA:  General endotracheal supervised by Beth Maldonado.  BLOOD LOSS:  150 mL.  No blood was given.    No drains were left in.    Local anesthetic was 20 mL of Exparel, mixed with 20 mL of saline.  SPECIMEN:  Left colon, proximal sigmoid colon with a suture mark on the distal end.  COUNT:  The lap count was correct.  INDICATION FOR PROCEDURE:  Beth Maldonado is a 70 year old African American female who sees Beth Maldonado as her primary care doctor.  She underwent a colonoscopy, in which Beth Maldonado saw a proximal sigmoid lesion suspicious for carcinoma.  Biopsies were inconclusive and she now comes for resection of this area of her sigmoid colon.  She originally saw Beth Maldonado, but the patient requested another opinion within our office.  The indications, potential complications of procedure were explained to the patient.  Potential complications include, but not limited to, bleeding, infection, nerve injury, ureteral injury, leakage from the bowel and the possible recurrent tumor.  OPERATIVE NOTE:  The patient was placed in supine position in Room #1 at [facility].  She was then put in a lithotomy position with both her arms tucked.  Foley catheter in place.  Her abdomen was prepped with ChloraPrep and perineum with Betadine  sterilely draped.  She was given antibiotics preoperatively.  A time-out was held and the surgical checklist run.  Her abdomen was sterilely draped.  I accessed her abdomen through the right upper quadrant with a 5-mm Optiview placed.  Five additional trocars, one in the right lower quadrant, one below the abdomen, one in the left lower quadrant, one in the left upper quadrant, one above the umbilicus for total of 6 trocars sites, each trocar site was 5 mm.  I explored the patient.  The right and left lobes of the liver were unremarkable.  The gallbladder was unremarkable.  The stomach, which I could see, was unremarkable.  The small bowel was unremarkable.  I was able to run the small back from the terminal ileum and back about 2 feet, saw no evidence of Meckel's.  Her appendix appeared normal.  Right colon, transverse colon that I could see was unremarkable.  Her left transverse colon went up and the splenic flexure was up over the edge of the spleen.  I went down the left colon and at the distal left colon there was a tattooed area and I could see what looked like a tumor mass in the colon.  There was  no intraperitoneal nodules.  There was some staining of the peritoneum from the dye injection.  I then mobilized the proximal sigmoid colon and the entire left colon and splenic flexure.  I went to the midtransverse colon to mobilize all this.   I thought I had enough mobility of the left colon to bring this to the midline, so I used a hand assist port through a supraumbilical incision and eviscerated the colon, the tumor, and the associated mesentery.  I divided the colon 10 cm proximal and 10 cm distal to the colon tumor.  I used a 75 mm GIA stapler on the distal end and I divided the proximal end with a Kocher clamp.  I took the mesentery down with both Kelly clamps and Harmonic scalpel, trying to get all the way down toward the retroperitoneum with the mesenteric dissection.  There was no obvious  or gross lymphadenopathy.  The colon specimen was sent to Pathology.  There was palpable tumor in the middle of the speicimen that looks like a colon cancer.  I then carried out an end-to-end anastomosis between the proximal left colon and the sigmoid colon with interrupted 2-0 silk sutures.  This was a single layer of interrupted sutures.  Dr. Redmond Maldonado then went below, did a rigid sigmoidoscopy to insufflate the anastomosis.  I placed the anastomosis under water, there was no evidence of a leak.  I then returned the bowel to the abdominal cavity and tried to place the colon in the left colonic gutter.  I did not try to close the mesentery of the colon.  I then irrigated the abdomen with 2 liters of saline.  We then changed gown, gloves, drapes.  I came back and irrigated another liter of saline into the wound and then closed the fascia with two running #1 PDS sutures.  I then re-laparoscoped the patient.  She did have some of the omentum caught up in the proximal wound that was able to take down.  I again looked to the liver and saw no nodularity or mass in the liver.  I then removed my trocars in turn.  I irrigated the midline wound with 500 mL of saline, closed it with 3-0 Vicryl for subcutaneous stitches and 4-0 Monocryl for the skin and closed the trocar site with a 4-0 Monocryl after these trocars had been removed.  The patient tolerated the procedure well, was transported to the recovery room in good condition.  Sponge and needle counts were correct at the end of the case.   Fenton Maldonado. Beth Maldonado, M.D., FACS, Scribe for Epic   DHN/MEDQ  D:  12/18/2015  T:  12/18/2015  Job:  [id number]  cc:   Beth Homans, MD Fax: Mylo, MD,FACG [facility] [address] [place], [place] [address]

## 2015-12-20 LAB — GLUCOSE, CAPILLARY
GLUCOSE-CAPILLARY: 95 mg/dL (ref 65–99)
Glucose-Capillary: 134 mg/dL — ABNORMAL HIGH (ref 65–99)
Glucose-Capillary: 109 mg/dL — ABNORMAL HIGH (ref 65–99)
Glucose-Capillary: 204 mg/dL — ABNORMAL HIGH (ref 65–99)

## 2015-12-20 LAB — CBC WITH DIFFERENTIAL/PLATELET
BASOS PCT: 0 %
Basophils Absolute: 0 10*3/uL (ref 0.0–0.1)
EOS ABS: 0.4 10*3/uL (ref 0.0–0.7)
EOS PCT: 3 %
HEMATOCRIT: 28.4 % — AB (ref 36.0–46.0)
Hemoglobin: 8.8 g/dL — ABNORMAL LOW (ref 12.0–15.0)
Lymphocytes Relative: 16 %
Lymphs Abs: 2.2 10*3/uL (ref 0.7–4.0)
MCH: 27.4 pg (ref 26.0–34.0)
MCHC: 31 g/dL (ref 30.0–36.0)
MCV: 88.5 fL (ref 78.0–100.0)
MONOS PCT: 10 %
Monocytes Absolute: 1.4 10*3/uL — ABNORMAL HIGH (ref 0.1–1.0)
NEUTROS PCT: 71 %
Neutro Abs: 9.6 10*3/uL — ABNORMAL HIGH (ref 1.7–7.7)
Platelets: 534 10*3/uL — ABNORMAL HIGH (ref 150–400)
RBC: 3.21 MIL/uL — AB (ref 3.87–5.11)
RDW: 15.6 % — AB (ref 11.5–15.5)
WBC: 13.7 10*3/uL — AB (ref 4.0–10.5)

## 2015-12-20 LAB — BASIC METABOLIC PANEL
Anion gap: 7 (ref 5–15)
BUN: 12 mg/dL (ref 6–20)
CALCIUM: 8.6 mg/dL — AB (ref 8.9–10.3)
CO2: 27 mmol/L (ref 22–32)
CREATININE: 1.25 mg/dL — AB (ref 0.44–1.00)
Chloride: 106 mmol/L (ref 101–111)
GFR, EST AFRICAN AMERICAN: 50 mL/min — AB (ref >60)
GFR, EST NON AFRICAN AMERICAN: 43 mL/min — AB (ref >60)
GLUCOSE: 127 mg/dL — AB (ref 65–99)
Potassium: 4.1 mmol/L (ref 3.5–5.1)
Sodium: 140 mmol/L (ref 135–145)

## 2015-12-20 NOTE — Progress Notes (Addendum)
[facility]:  [phone] General Surgery Progress Note   LOS: 2 days  POD -  2 Days Post-Op  Assessment/Plan: 1.  LAPAROSCOPIC assisted Left COLECTOMY  Doing okay.  Will start clear liquids.  2. DM x 15 years, on insulin the last 10 HgbA1c - 7.5 on 09/20/2015  Gluc - 127 - 12/20/2015 3. HTN x 20 years 4. Hypercholesterolemia 5. 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 He back gives out at times 7.  DVT prophylaxis - SQ Heparin 8.  Anemia  Hgb - 8.8 - 12/20/2015 - recheck labs tomorrow 9.  Creatinine mildly elevated  Slightly better today   Active Problems:   Cancer of left colon (HCC)   Subjective:  Doing okay.  No BM or flatus.  Objective:   Filed Vitals:   12/20/15 0612 12/20/15 0800  BP: 142/65   Pulse: 83   Temp: 97.8 F (36.6 C)   Resp: 12 10     Intake/Output from previous day:  05/23 0701 - 05/24 0700 In: 1544.6 [P.O.:30; I.V.:1514.6] Out: 1600 [Urine:1600]  Intake/Output this shift:      Physical Exam:   General: Mildly obese AA F who is alert and oriented.    HEENT: Normal. Pupils equal. .   Lungs: Clear.     Abdomen: Soft. Has active BS.   Wound: Some bleeding from the lower part of the wound.  Will redress wound.   Lab Results:     Recent Labs  12/19/15 0428 12/20/15 0436  WBC 17.0* 13.7*  HGB 9.0* 8.8*  HCT 27.8* 28.4*  PLT 537* 534*    BMET    Recent Labs  12/19/15 0428 12/20/15 0436  NA 138 140  K 3.4* 4.1  CL 105 106  CO2 27 27  GLUCOSE 106* 127*  BUN 17 12  CREATININE 1.38* 1.25*  CALCIUM 8.0* 8.6*    PT/INR  No results for input(s): LABPROT, INR in the last 72 hours.  ABG  No results for input(s): PHART, HCO3 in the last 72 hours.  Invalid input(s): PCO2, PO2   Studies/Results:  No results found.   Anti-infectives:   Anti-infectives    Start     Dose/Rate Route Frequency Ordered Stop   12/18/15 0902  cefoTEtan (CEFOTAN) 2 g in dextrose 5 % 50 mL IVPB  Status:   Discontinued     2 g 100 mL/hr over 30 Minutes Intravenous On call to O.R. 12/18/15 0902 12/18/15 0905   12/18/15 0758  clindamycin (CLEOCIN) IVPB 900 mg     900 mg 100 mL/hr over 30 Minutes Intravenous 60 min pre-op 12/18/15 0758 12/18/15 1052   12/18/15 0758  gentamicin (GARAMYCIN) 320 mg in dextrose 5 % 100 mL IVPB     5 mg/kg  63.3 kg (Adjusted) 108 mL/hr over 60 Minutes Intravenous 60 min pre-op 12/18/15 0758 12/18/15 1018      Alphonsa Overall, MD, FACS Pager: [facility]: [phone] 12/20/2015

## 2015-12-21 ENCOUNTER — Encounter: Payer: Self-pay | Admitting: Internal Medicine

## 2015-12-21 LAB — GLUCOSE, CAPILLARY
Glucose-Capillary: 134 mg/dL — ABNORMAL HIGH (ref 65–99)
Glucose-Capillary: 138 mg/dL — ABNORMAL HIGH (ref 65–99)
Glucose-Capillary: 161 mg/dL — ABNORMAL HIGH (ref 65–99)

## 2015-12-21 MED ORDER — HYDROCODONE-ACETAMINOPHEN 5-325 MG PO TABS: 1.0000 | ORAL_TABLET | Freq: Four times a day (QID) | ORAL | Status: DC | PRN

## 2015-12-21 NOTE — Progress Notes (Signed)
Discharge instructions given to patient. Questions answered 

## 2015-12-21 NOTE — Progress Notes (Addendum)
[facility]:  [phone] General Surgery Progress Note   LOS: 3 days  POD -  3 Days Post-Op  Assessment/Plan: 1.  LAPAROSCOPIC assisted left COLECTOMY, mobilization of the splenic flexure - 12/18/2015  Final path - 3.9 adenoca, moderately differentiated, 0/18 nodes, (T3, N0) - I gave her a copy of the report.  Doing okay.  Will start clear liquids.  2. DM x 15 years, on insulin the last 10 HgbA1c - 7.5 on 09/20/2015  Gluc - 134 - 12/21/2015 3. HTN x 20 years 4. Hypercholesterolemia 5. 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 Her back gives out at times 7.  DVT prophylaxis - SQ Heparin 8.  Anemia  Hgb - 8.8 - 12/20/2015  9.  Creatinine mildly elevated    Active Problems:   Cancer of left colon (HCC)   Subjective:  Doing okay.  Had some BM's.  Wants to go home.  Will get her lunch, but if she does well, she can go home.  Objective:   Filed Vitals:   12/21/15 0533 12/21/15 0553  BP: 166/83   Pulse: 75   Temp: 98.3 F (36.8 C)   Resp: 16 12     Intake/Output from previous day:  05/24 0701 - 05/25 0700 In: 1343.3 [I.V.:1343.3] Out: 2050 [Urine:2050]  Intake/Output this shift:      Physical Exam:   General: Mildly obese AA F who is alert and oriented.    HEENT: Normal. Pupils equal. .   Lungs: Clear.     Abdomen: Soft. Has active BS.   Wound: Clean.  No more bleeding.   Lab Results:     Recent Labs  12/19/15 0428 12/20/15 0436  WBC 17.0* 13.7*  HGB 9.0* 8.8*  HCT 27.8* 28.4*  PLT 537* 534*    BMET    Recent Labs  12/19/15 0428 12/20/15 0436  NA 138 140  K 3.4* 4.1  CL 105 106  CO2 27 27  GLUCOSE 106* 127*  BUN 17 12  CREATININE 1.38* 1.25*  CALCIUM 8.0* 8.6*    PT/INR  No results for input(s): LABPROT, INR in the last 72 hours.  ABG  No results for input(s): PHART, HCO3 in the last 72 hours.  Invalid input(s): PCO2, PO2   Studies/Results:  No results  found.   Anti-infectives:   Anti-infectives    Start     Dose/Rate Route Frequency Ordered Stop   12/18/15 0902  cefoTEtan (CEFOTAN) 2 g in dextrose 5 % 50 mL IVPB  Status:  Discontinued     2 g 100 mL/hr over 30 Minutes Intravenous On call to O.R. 12/18/15 0902 12/18/15 0905   12/18/15 0758  clindamycin (CLEOCIN) IVPB 900 mg     900 mg 100 mL/hr over 30 Minutes Intravenous 60 min pre-op 12/18/15 0758 12/18/15 1052   12/18/15 0758  gentamicin (GARAMYCIN) 320 mg in dextrose 5 % 100 mL IVPB     5 mg/kg  63.3 kg (Adjusted) 108 mL/hr over 60 Minutes Intravenous 60 min pre-op 12/18/15 0758 12/18/15 1018      Alphonsa Overall, MD, FACS Pager: [facility]: [phone] 12/21/2015

## 2015-12-21 NOTE — Discharge Instructions (Signed)
[facility] - DISCHARGE INSTRUCTIONS TO PATIENT  Activity:  Driving - May drive in 3 or 4 days, if doing well   Lifting - No lifting more than 15 pounds for 10 days, then no limit  Wound Care:   May shower  Diet:  As tolerated  Follow up appointment:  Call Dr. Pollie Friar office [facility]) at [phone] for an appointment in 2 to 3 weeks        We arrange for you to see an oncologist about possible further treatment  Medications and dosages:  Resume your home medications.  You have a prescription for:  Vicodin  Call Dr. Lucia Gaskins or his office  [phone]) if you have:  Temperature greater than 100.4,  Persistent nausea and vomiting,  Severe uncontrolled pain,  Redness, tenderness, or signs of infection (pain, swelling, redness, odor or green/yellow discharge around the site),  Difficulty breathing, headache or visual disturbances,  Any other questions or concerns you may have after discharge.  In an emergency, call 911 or go to an Emergency Department at a nearby hospital.

## 2015-12-21 NOTE — Care Management Important Message (Signed)
Important Message  Patient Details  Name: HADLI PLETCHER MRN: [id number] Date of Birth: [birth year]   Medicare Important Message Given:  Yes    Camillo Flaming 12/21/2015, 11:02 AMImportant Message  Patient Details  Name: JI CANCILLA MRN: [id number] Date of Birth: Oct 21, 1945   Medicare Important Message Given:  Yes    Camillo Flaming 12/21/2015, 11:02 AM

## 2015-12-21 NOTE — Discharge Summary (Signed)
Physician Discharge Summary  Patient ID:  ELKA STREIF  MRN: [id number]  DOB/AGE: 01-25-1946 70 y.o.  Admit date: 12/18/2015 Discharge date: 12/21/2015  Discharge Diagnoses:  1.  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 DM x 15 years, on insulin the last 10 HgbA1c - 7.5 on 09/20/2015 Gluc - 134 - 12/21/2015 3. HTN x 20 years 4. Hypercholesterolemia 5. 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 Her back gives out at times 7. Anemia - Chronic anemia and acute blood loss Hgb - 8.8 - 12/20/2015    Active Problems:   Cancer of left colon [facility])  Operation: Procedure(s): LAPAROSCOPIC SIGMOID COLECTOMY, mobilization of the splenic flexure on 12/18/2015 - D. Lucia Gaskins  Discharged Condition: good  Hospital Course: Beth Maldonado is an 70 y.o. female whose primary care physician is Penni Homans, MD and who was admitted 12/18/2015 with a chief complaint of sigmoid colon tumor.  Dr. Carlean Purl did a colonoscopy on 27 October 2015 which revealed a proximal sigmoid lesion suspicious for cancer.  She was brought to the operating room on 12/18/2015 and underwent  LAPAROSCOPIC assisted Left COLECTOMY.  Post op she did well.  She was started on clear liquids on 12/20/2015.  She has had a BM and is ready to go home. I reviewed her pathology report with her. We will arrange oncology as an outpatient.  The discharge instructions were reviewed with the patient.  Consults: None  Significant Diagnostic Studies: Results for orders placed or performed during the hospital encounter of 12/18/15  Glucose, capillary  Result Value Ref Range   Glucose-Capillary 114 (H) 65 - 99 mg/dL   Comment 1 Notify RN    Comment 2 Document in Chart   Glucose, capillary  Result Value Ref Range   Glucose-Capillary 193 (H) 65 - 99 mg/dL  Glucose, capillary  Result Value Ref Range   Glucose-Capillary 313 (H) 65 - 99 mg/dL   Comment 1  Notify RN   Basic metabolic panel  Result Value Ref Range   Sodium 138 135 - 145 mmol/L   Potassium 3.4 (L) 3.5 - 5.1 mmol/L   Chloride 105 101 - 111 mmol/L   CO2 27 22 - 32 mmol/L   Glucose, Bld 106 (H) 65 - 99 mg/dL   BUN 17 6 - 20 mg/dL   Creatinine, Ser 1.38 (H) 0.44 - 1.00 mg/dL   Calcium 8.0 (L) 8.9 - 10.3 mg/dL   GFR calc non Af Amer 38 (L) >60 mL/min   GFR calc Af Amer 44 (L) >60 mL/min   Anion gap 6 5 - 15  CBC  Result Value Ref Range   WBC 17.0 (H) 4.0 - 10.5 K/uL   RBC 3.21 (L) 3.87 - 5.11 MIL/uL   Hemoglobin 9.0 (L) 12.0 - 15.0 g/dL   HCT 27.8 (L) 36.0 - 46.0 %   MCV 86.6 78.0 - 100.0 fL   MCH 28.0 26.0 - 34.0 pg   MCHC 32.4 30.0 - 36.0 g/dL   RDW 15.5 11.5 - 15.5 %   Platelets 537 (H) 150 - 400 K/uL  Glucose, capillary  Result Value Ref Range   Glucose-Capillary 188 (H) 65 - 99 mg/dL  Glucose, capillary  Result Value Ref Range   Glucose-Capillary 124 (H) 65 - 99 mg/dL  Glucose, capillary  Result Value Ref Range   Glucose-Capillary 102 (H) 65 - 99 mg/dL  Glucose, capillary  Result Value Ref Range   Glucose-Capillary 141 (  H) 65 - 99 mg/dL  Glucose, capillary  Result Value Ref Range   Glucose-Capillary 127 (H) 65 - 99 mg/dL  Glucose, capillary  Result Value Ref Range   Glucose-Capillary 139 (H) 65 - 99 mg/dL  CBC with Differential/Platelet  Result Value Ref Range   WBC 13.7 (H) 4.0 - 10.5 K/uL   RBC 3.21 (L) 3.87 - 5.11 MIL/uL   Hemoglobin 8.8 (L) 12.0 - 15.0 g/dL   HCT 28.4 (L) 36.0 - 46.0 %   MCV 88.5 78.0 - 100.0 fL   MCH 27.4 26.0 - 34.0 pg   MCHC 31.0 30.0 - 36.0 g/dL   RDW 15.6 (H) 11.5 - 15.5 %   Platelets 534 (H) 150 - 400 K/uL   Neutrophils Relative % 71 %   Neutro Abs 9.6 (H) 1.7 - 7.7 K/uL   Lymphocytes Relative 16 %   Lymphs Abs 2.2 0.7 - 4.0 K/uL   Monocytes Relative 10 %   Monocytes Absolute 1.4 (H) 0.1 - 1.0 K/uL   Eosinophils Relative 3 %   Eosinophils Absolute 0.4 0.0 - 0.7 K/uL   Basophils Relative 0 %   Basophils Absolute 0.0  0.0 - 0.1 K/uL  Basic metabolic panel  Result Value Ref Range   Sodium 140 135 - 145 mmol/L   Potassium 4.1 3.5 - 5.1 mmol/L   Chloride 106 101 - 111 mmol/L   CO2 27 22 - 32 mmol/L   Glucose, Bld 127 (H) 65 - 99 mg/dL   BUN 12 6 - 20 mg/dL   Creatinine, Ser 1.25 (H) 0.44 - 1.00 mg/dL   Calcium 8.6 (L) 8.9 - 10.3 mg/dL   GFR calc non Af Amer 43 (L) >60 mL/min   GFR calc Af Amer 50 (L) >60 mL/min   Anion gap 7 5 - 15  Glucose, capillary  Result Value Ref Range   Glucose-Capillary 173 (H) 65 - 99 mg/dL  Glucose, capillary  Result Value Ref Range   Glucose-Capillary 164 (H) 65 - 99 mg/dL  Glucose, capillary  Result Value Ref Range   Glucose-Capillary 134 (H) 65 - 99 mg/dL  Glucose, capillary  Result Value Ref Range   Glucose-Capillary 109 (H) 65 - 99 mg/dL  Glucose, capillary  Result Value Ref Range   Glucose-Capillary 204 (H) 65 - 99 mg/dL  Glucose, capillary  Result Value Ref Range   Glucose-Capillary 95 65 - 99 mg/dL  Glucose, capillary  Result Value Ref Range   Glucose-Capillary 161 (H) 65 - 99 mg/dL  Glucose, capillary  Result Value Ref Range   Glucose-Capillary 134 (H) 65 - 99 mg/dL    No results found.  Discharge Exam:  Filed Vitals:   12/21/15 0533 12/21/15 0553  BP: 166/83   Pulse: 75   Temp: 98.3 F (36.8 C)   Resp: 16 12    General: Obese AA F who is alert and generally healthy appearing.  Lungs: Clear to auscultation and symmetric breath sounds. Heart:  RRR. No murmur or rub. Abdomen: Soft. No mass.  No hernia. Normal bowel sounds. Her incisions look good.   Discharge Medications:     Medication List    ASK your doctor about these medications        aspirin EC 81 MG tablet  Take 81 mg by mouth daily.     atorvastatin 10 MG tablet  Commonly known as:  LIPITOR  Take 1 tablet (10 mg total) by mouth daily.     cyclobenzaprine 5 MG tablet  Commonly  known as:  FLEXERIL  TAKE 1 TABLET BY MOUTH ATBEDTIME AS NEEDED FOR MUSCLE SPASMS.      escitalopram 20 MG tablet  Commonly known as:  LEXAPRO  Take 1 tablet by mouth  daily     gabapentin 300 MG capsule  Commonly known as:  NEURONTIN  TAKE 2 CAPSULES BY MOUTH 3 TIMES A DAY     glucose blood test strip  Commonly known as:  ONETOUCH VERIO  Use as instructed to check blood sugar 4 times per day dx code E11.9     hydrochlorothiazide 25 MG tablet  Commonly known as:  HYDRODIURIL  TAKE 1 TABLET BY MOUTH ONCE A DAY     hydrOXYzine 25 MG tablet  Commonly known as:  ATARAX/VISTARIL  TAKE 1 TABLET BY MOUTH TWICE A DAY     Insulin Glargine 300 UNIT/ML Sopn  Commonly known as:  TOUJEO SOLOSTAR  Inject 22 Units into the skin daily.     insulin lispro 100 UNIT/ML KiwkPen  Commonly known as:  HUMALOG KWIKPEN  5 units at breakfast and supper and 7 for lunch     Insulin Pen Needle 32G X 4 MM Misc  Use for insulin injection twice a day 250.00     lidocaine 5 %  Commonly known as:  LIDODERM  Place 1 patch onto the skin daily. Remove & Discard patch within 12 hours or as directed by MD     metFORMIN 500 MG tablet  Commonly known as:  GLUCOPHAGE  TAKE 2 TABLETS BY MOUTH TWICE DAILY WITH MEALS     metroNIDAZOLE 500 MG tablet  Commonly known as:  FLAGYL  Take 1,000 mg by mouth See admin instructions. Take at 2pm, 3pm and 10pm the day prior to procedure.     multivitamin tablet  Take 1 tablet by mouth daily.     neomycin 500 MG tablet  Commonly known as:  MYCIFRADIN  Take 1,000 mg by mouth See admin instructions. Take at 2pm, 3pm and 10pm the day prior to procedure.     ONETOUCH DELICA LANCETS 99991111 Misc  Use as directed to check blood sugar 4 times per day dx code E11.9     triamterene-hydrochlorothiazide 37.5-25 MG tablet  Commonly known as:  MAXZIDE-25  TAKE 1 TABLET BY MOUTH DAILY.        Disposition: 01-Home or Self Care   Activity:  Driving - May drive in 3 or 4 days, if doing well   Lifting - No lifting more than 15 pounds for 10 days, then no limit  Wound  Care:   May shower  Diet:  As tolerated  Follow up appointment:  Call Dr. Pollie Friar office [facility]) at [phone] for an appointment in 2 to 3 weeks        We arrange for you to see an oncologist about possible further treatment  Medications and dosages:  Resume your home medications.  You have a prescription for:  Vicodin  Signed: Alphonsa Overall, M.D., [facility] Office:  [phone]  12/21/2015, 7:18 AM

## 2015-12-22 ENCOUNTER — Encounter: Payer: Self-pay | Admitting: Hematology & Oncology

## 2015-12-26 ENCOUNTER — Encounter (HOSPITAL_COMMUNITY): Payer: Self-pay

## 2016-01-01 ENCOUNTER — Ambulatory Visit (HOSPITAL_BASED_OUTPATIENT_CLINIC_OR_DEPARTMENT_OTHER): Payer: MEDICARE | Admitting: Hematology & Oncology

## 2016-01-01 ENCOUNTER — Other Ambulatory Visit (HOSPITAL_BASED_OUTPATIENT_CLINIC_OR_DEPARTMENT_OTHER): Payer: MEDICARE

## 2016-01-01 ENCOUNTER — Other Ambulatory Visit (INDEPENDENT_AMBULATORY_CARE_PROVIDER_SITE_OTHER): Payer: MEDICARE

## 2016-01-01 ENCOUNTER — Encounter: Payer: Self-pay | Admitting: Hematology & Oncology

## 2016-01-01 VITALS — BP 140/66 | HR 82 | Temp 97.7°F | Resp 16 | Ht 62.0 in | Wt 179.0 lb

## 2016-01-01 DIAGNOSIS — D500 Iron deficiency anemia secondary to blood loss (chronic): Secondary | ICD-10-CM

## 2016-01-01 DIAGNOSIS — I10 Essential (primary) hypertension: Secondary | ICD-10-CM

## 2016-01-01 DIAGNOSIS — C189 Malignant neoplasm of colon, unspecified: Secondary | ICD-10-CM | POA: Diagnosis not present

## 2016-01-01 DIAGNOSIS — D473 Essential (hemorrhagic) thrombocythemia: Secondary | ICD-10-CM | POA: Diagnosis not present

## 2016-01-01 DIAGNOSIS — C186 Malignant neoplasm of descending colon: Secondary | ICD-10-CM

## 2016-01-01 DIAGNOSIS — Z0000 Encounter for general adult medical examination without abnormal findings: Secondary | ICD-10-CM | POA: Diagnosis not present

## 2016-01-01 DIAGNOSIS — E0810 Diabetes mellitus due to underlying condition with ketoacidosis without coma: Secondary | ICD-10-CM | POA: Diagnosis not present

## 2016-01-01 DIAGNOSIS — E782 Mixed hyperlipidemia: Secondary | ICD-10-CM | POA: Diagnosis not present

## 2016-01-01 LAB — CBC
HEMATOCRIT: 32.2 % — AB (ref 36.0–46.0)
Hemoglobin: 10.3 g/dL — ABNORMAL LOW (ref 12.0–15.0)
MCHC: 31.8 g/dL (ref 30.0–36.0)
MCV: 85.5 fl (ref 78.0–100.0)
Platelets: 963 10*3/uL — ABNORMAL HIGH (ref 150.0–400.0)
RBC: 3.77 Mil/uL — ABNORMAL LOW (ref 3.87–5.11)
RDW: 15.3 % (ref 11.5–15.5)
WBC: 13.9 10*3/uL — ABNORMAL HIGH (ref 4.0–10.5)

## 2016-01-01 LAB — LIPID PANEL
Cholesterol: 170 mg/dL (ref 0–200)
HDL: 45.6 mg/dL (ref >39.00)
LDL Cholesterol: 95 mg/dL (ref 0–99)
NonHDL: 124.85
Total CHOL/HDL Ratio: 4
Triglycerides: 151 mg/dL — ABNORMAL HIGH (ref 0.0–149.0)
VLDL: 30.2 mg/dL (ref 0.0–40.0)

## 2016-01-01 LAB — CMP (CANCER CENTER ONLY)
ALBUMIN: 4 g/dL (ref 3.3–5.5)
ALK PHOS: 66 U/L (ref 26–84)
ALT: 22 U/L (ref 10–47)
AST: 20 U/L (ref 11–38)
BILIRUBIN TOTAL: 0.9 mg/dL (ref 0.20–1.60)
BUN, Bld: 21 mg/dL (ref 7–22)
CO2: 29 meq/L (ref 18–33)
CREATININE: 1.7 mg/dL — AB (ref 0.6–1.2)
Calcium: 9.7 mg/dL (ref 8.0–10.3)
Chloride: 99 mEq/L (ref 98–108)
GLUCOSE: 122 mg/dL — AB (ref 73–118)
Potassium: 3.7 mEq/L (ref 3.3–4.7)
SODIUM: 142 meq/L (ref 128–145)
Total Protein: 7.8 g/dL (ref 6.4–8.1)

## 2016-01-01 LAB — HEPATITIS C ANTIBODY: HCV AB: NEGATIVE

## 2016-01-01 LAB — HEMOGLOBIN A1C: HEMOGLOBIN A1C: 7.1 % — AB (ref 4.6–6.5)

## 2016-01-01 LAB — COMPREHENSIVE METABOLIC PANEL
ALT: 12 U/L (ref 0–35)
AST: 17 U/L (ref 0–37)
Albumin: 4.5 g/dL (ref 3.5–5.2)
Alkaline Phosphatase: 69 U/L (ref 39–117)
BUN: 24 mg/dL — ABNORMAL HIGH (ref 6–23)
CO2: 28 mEq/L (ref 19–32)
Calcium: 10.1 mg/dL (ref 8.4–10.5)
Chloride: 100 mEq/L (ref 96–112)
Creatinine, Ser: 1.52 mg/dL — ABNORMAL HIGH (ref 0.40–1.20)
GFR: 43.48 mL/min — ABNORMAL LOW (ref >60.00)
Glucose, Bld: 113 mg/dL — ABNORMAL HIGH (ref 70–99)
Potassium: 3.8 mEq/L (ref 3.5–5.1)
Sodium: 140 mEq/L (ref 135–145)
Total Bilirubin: 0.6 mg/dL (ref 0.2–1.2)
Total Protein: 7.8 g/dL (ref 6.0–8.3)

## 2016-01-01 LAB — CBC WITH DIFFERENTIAL (CANCER CENTER ONLY)
BASO#: 0.1 10*3/uL (ref 0.0–0.2)
BASO%: 0.4 % (ref 0.0–2.0)
EOS%: 7.7 % — AB (ref 0.0–7.0)
Eosinophils Absolute: 1.1 10*3/uL — ABNORMAL HIGH (ref 0.0–0.5)
HEMATOCRIT: 32.2 % — AB (ref 34.8–46.6)
HEMOGLOBIN: 10.1 g/dL — AB (ref 11.6–15.9)
LYMPH#: 3.4 10*3/uL — ABNORMAL HIGH (ref 0.9–3.3)
LYMPH%: 24.5 % (ref 14.0–48.0)
MCH: 27.9 pg (ref 26.0–34.0)
MCHC: 31.4 g/dL — AB (ref 32.0–36.0)
MCV: 89 fL (ref 81–101)
MONO#: 1 10*3/uL — AB (ref 0.1–0.9)
MONO%: 7 % (ref 0.0–13.0)
NEUT#: 8.3 10*3/uL — ABNORMAL HIGH (ref 1.5–6.5)
NEUT%: 60.4 % (ref 39.6–80.0)
Platelets: 909 10*3/uL — ABNORMAL HIGH (ref 145–400)
RBC: 3.62 10*6/uL — ABNORMAL LOW (ref 3.70–5.32)
RDW: 14.5 % (ref 11.1–15.7)
WBC: 13.8 10*3/uL — ABNORMAL HIGH (ref 3.9–10.0)

## 2016-01-01 LAB — TSH: TSH: 1.32 u[IU]/mL (ref 0.35–4.50)

## 2016-01-02 ENCOUNTER — Other Ambulatory Visit: Payer: MEDICARE

## 2016-01-02 LAB — IRON AND TIBC
%SAT: 13 % — AB (ref 21–57)
IRON: 44 ug/dL (ref 41–142)
TIBC: 324 ug/dL (ref 236–444)
UIBC: 280 ug/dL (ref 120–384)

## 2016-01-02 LAB — FERRITIN: Ferritin: 39 ng/ml (ref 9–269)

## 2016-01-02 LAB — CEA: CEA: 2.9 ng/mL (ref 0.0–4.7)

## 2016-01-02 NOTE — Progress Notes (Signed)
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 Follow-up  : I have colon cancer.  HPI: Ms. Beth Maldonado is a charming 70 year old white female. She was seen last couple years ago. She had anemia. She never has had a colonoscopy.  In March 2017, she finally did have a colonoscopy done. This, and 14, showed a large mass in the sigmoid colon. The pathology report ([id number]) did not show any invasive cancer. There is some adenomatous changes.  She had a CEA level done back in March. This was normal at 2.9.  She subsequently went to surgery. There was a lot of concern about malignancy. On May 22, she went for a laparoscopic sigmoid colectomy. The pathology report ([id number]) showed a invasive adenocarcinoma. The tumors measured 3.9 cm. It invaded into the pericolonic fat. All margins were negative. 18 lymph nodes were negative.  As such, she was a stage II (T3 N0 M0) colon cancer.  She had MMR tested and this was normal.  She was on hospital for 3 days. She looks fantastic. She is eating. She's having no problems with bowels or bladder. She's had no cough.  There is no history of colon cancer in the family. She does not smoke. She does not drink. There is no obvious occupational exposures.  Overall, her performance status is ECOG 1.    Past Medical History  Diagnosis Date  . Osteopenia   . Diabetes mellitus   . Hypertension   . Acute bronchitis 03/29/2013  . Allergy   . Arthritis   . Depression with anxiety 06/27/2013  . Medicare annual wellness visit, subsequent 03/23/2014  . Neck pain on left side 05/29/2014    hx of  . Acute upper respiratory infection 11/27/2014  . Insomnia 06/30/2015  . Anemia 07/02/2015  . Preventative health care 07/02/2015  . Hyperlipidemia   . Neuropathy ([place])   . Neuromuscular disorder (HCC)     neuropathy feet  . Back pain     upper and lower last 20 years   . Tinnitus     RIGHT EAR ALL THE TIME  . Colon cancer [facility])   :  Past Surgical History  Procedure Laterality Date  . Cataract extraction, bilateral  2011  . Knee arthroscopy Right 2003  . Breast enhancement surgery Bilateral 1987    Reduction  . Tubal ligation  1970  . Hemorrhoid surgery  1970  . Vaginal hysterectomy  1974    partial  . Laparoscopic sigmoid colectomy N/A 12/18/2015    Procedure: LAPAROSCOPIC SIGMOID COLECTOMY;  Surgeon: Alphonsa Overall, MD;  Location: [facility] ORS;  Service: General;  Laterality: N/A;  . Colonoscopy  2017    colon cancer  :   Current outpatient prescriptions:  .  aspirin EC 81 MG tablet, Take 81 mg by mouth daily., Disp: , Rfl:  .  atorvastatin (LIPITOR) 10 MG tablet, Take 1 tablet (10 mg total) by mouth daily., Disp: 30 tablet, Rfl: 5 .  cyclobenzaprine (FLEXERIL) 5 MG tablet, TAKE 1 TABLET BY MOUTH ATBEDTIME AS NEEDED FOR MUSCLE SPASMS., Disp: 30 tablet, Rfl: 0 .  escitalopram (LEXAPRO) 20 MG tablet, Take 1 tablet by mouth  daily, Disp: 90 tablet, Rfl: 0 .  gabapentin (NEURONTIN) 300 MG capsule, TAKE 2 CAPSULES BY MOUTH 3 TIMES A DAY, Disp: 180 capsule, Rfl: 3 .  glucose blood (ONETOUCH VERIO) test strip, Use as instructed to check blood sugar  4 times per day dx code E11.9, Disp: 125 each, Rfl: 3 .  hydrochlorothiazide (HYDRODIURIL) 25 MG tablet, TAKE 1 TABLET BY MOUTH ONCE A DAY, Disp: 30 tablet, Rfl: 6 .  HYDROcodone-acetaminophen (NORCO/VICODIN) 5-325 MG tablet, Take 1-2 tablets by mouth every 6 (six) hours as needed for moderate pain., Disp: 30 tablet, Rfl: 0 .  hydrOXYzine (ATARAX/VISTARIL) 25 MG tablet, TAKE 1 TABLET BY MOUTH TWICE A DAY, Disp: 60 tablet, Rfl: 0 .  Insulin Glargine (TOUJEO SOLOSTAR) 300 UNIT/ML SOPN, Inject 22 Units into the skin daily., Disp: 3 pen, Rfl: 3 .  insulin lispro (HUMALOG KWIKPEN) 100 UNIT/ML KiwkPen, 5 units at breakfast and supper and 7 for lunch (Patient taking differently: Inject 5 Units into the skin 3 (three) times  daily with meals. ), Disp: 15 mL, Rfl: 1 .  Insulin Pen Needle 32G X 4 MM MISC, Use for insulin injection twice a day 250.00, Disp: 100 each, Rfl: 2 .  lidocaine (LIDODERM) 5 %, Place 1 patch onto the skin daily. Remove & Discard patch within 12 hours or as directed by MD (Patient taking differently: Place 1 patch onto the skin daily as needed (For pain.). Remove & Discard patch within 12 hours or as directed by MD), Disp: 30 patch, Rfl: 0 .  metFORMIN (GLUCOPHAGE) 500 MG tablet, TAKE 2 TABLETS BY MOUTH TWICE DAILY WITH MEALS, Disp: 120 tablet, Rfl: 6 .  Multiple Vitamin (MULTIVITAMIN) tablet, Take 1 tablet by mouth daily., Disp: , Rfl:  .  neomycin (MYCIFRADIN) 500 MG tablet, Take 1,000 mg by mouth See admin instructions. Take at 2pm, 3pm and 10pm the day prior to procedure., Disp: , Rfl:  .  ONETOUCH DELICA LANCETS 33G MISC, Use as directed to check blood sugar 4 times per day dx code E11.9, Disp: 200 each, Rfl: 3 .  triamterene-hydrochlorothiazide (MAXZIDE-25) 37.5-25 MG tablet, TAKE 1 TABLET BY MOUTH DAILY., Disp: 30 tablet, Rfl: 6:  :  Allergies  Allergen Reactions  . Penicillins Swelling and Other (See Comments)    tongue swelled per pt Has patient had a PCN reaction causing immediate rash, facial/tongue/throat swelling, SOB or lightheadedness with hypotension: yes Has patient had a PCN reaction causing severe rash involving mucus membranes or skin necrosis: no Has patient had a PCN reaction that required hospitalization no  Has patient had a PCN reaction occurring within the last 10 years: unsure If all of the above answers are "NO", then may proceed with Cephalosporin use.   :  Family History  Problem Relation Age of Onset  . Diabetes Mother   . Heart attack Father   . Heart disease Father     38 and 83 MI  . Diabetes Sister   . Sickle cell anemia Sister   . Arthritis Sister   . Hypertension Sister   . Diabetes Sister   . Thyroid disease Sister   . Arthritis Sister   .  Glaucoma Sister   . Colon cancer Neg Hx   :  Social History   Social History  . Marital Status: Married    Spouse Name: N/A  . Number of Children: 1  . Years of Education: N/A   Occupational History  . retired    Social History Main Topics  . Smoking status: Former Smoker -- 1.00 packs/day for 20 years    Types: Cigarettes    Start date: 10/03/1966    Quit date: 11/03/1986  . Smokeless tobacco: Never Used     Comment: quit 28 years ago  .   Alcohol Use: 0.0 oz/week    0 Standard drinks or equivalent per week     Comment: Rare- Special Occasional   . Drug Use: No  . Sexual Activity: No     Comment: lives with husband, no dietary restrictions   Other Topics Concern  . Not on file   Social History Narrative  :  Pertinent items are noted in HPI.  Exam: @IPVITALS@ Well-developed and well-nourished African American female in no obvious distress. Vital signs are temperature of 97.7. Pulse 82. Blood pressure 140/66. Weight is 179 pounds. An exam shows no ocular or oral lesions. There are no palpable cervical or supraclavicular lymph nodes. Lungs are clear. Cardiac exam regular rate and rhythm with no murmurs, rubs or bruits. Abdomen is soft and she has healing laparoscopy scar. There is no swelling or erythema. There is no guarding or rebound tenderness. Maybe some slight tenderness in the periumbilical region. No masses noted. There is no ascites. There is no palpable liver or spleen tip. Back exam shows no tenderness over the spine, ribs or hips. Extremities shows no clubbing, cyanosis or edema. Neurological exam shows no focal neurological deficits.    Recent Labs  01/01/16 1204 01/01/16 1315  WBC 13.8* 13.9*  HGB 10.1* 10.3*  HCT 32.2* 32.2*  PLT 909* 963.0 Repeated and verified X2.*    Recent Labs  01/01/16 1204 01/01/16 1315  NA 142 140  K 3.7 3.8  CL 99 100  CO2 29 28  GLUCOSE 122* 113*  BUN 21 24*  CREATININE 1.7* 1.52*  CALCIUM 9.7 10.1    Blood smear  review:  None  Pathology: See above     Assessment and Plan:  Mrs. Furnari is a very charming 69-year-old African American female. She has a recently resected stage II colon cancer. She had 18 lymph nodes that were negative. I do not see that she would be a candidate for adjuvant chemotherapy. I don't think we have to send off the Oncotype assay.  I'm little puzzled by the thrombocytosis. I know she's had this before. So this is probably reactive. We will have to follow this along. She may be iron deficient. Her MCV is down a little bit.  I did look at her blood smear. I do not see that looks suspicious. Her platelets but fairly uniform in size. There were a couple large platelets. There are no immature red cells or immature myeloid cells.  She is on aspirin.  I would like to follow her along for the thrombocytosis. We'll have to be careful with this.  I'll like to see her back probably in about 3 months. We will see her iron studies show.  Spent about 45 minutes with her. It was nice to see her again. I feel that she had this colon cancer but she understands why screening colonoscopies are so important now.       

## 2016-01-22 ENCOUNTER — Other Ambulatory Visit: Payer: Self-pay | Admitting: Family Medicine

## 2016-02-10 ENCOUNTER — Other Ambulatory Visit: Payer: Self-pay | Admitting: Endocrinology

## 2016-02-12 ENCOUNTER — Other Ambulatory Visit: Payer: Self-pay | Admitting: Family Medicine

## 2016-02-14 DIAGNOSIS — Z1231 Encounter for screening mammogram for malignant neoplasm of breast: Secondary | ICD-10-CM | POA: Diagnosis not present

## 2016-02-14 LAB — HM MAMMOGRAPHY

## 2016-02-16 ENCOUNTER — Encounter: Payer: Self-pay | Admitting: Family Medicine

## 2016-02-22 ENCOUNTER — Encounter: Payer: Self-pay | Admitting: Family Medicine

## 2016-02-22 DIAGNOSIS — R922 Inconclusive mammogram: Secondary | ICD-10-CM | POA: Diagnosis not present

## 2016-02-22 DIAGNOSIS — R928 Other abnormal and inconclusive findings on diagnostic imaging of breast: Secondary | ICD-10-CM | POA: Diagnosis not present

## 2016-02-27 ENCOUNTER — Encounter: Payer: Self-pay | Admitting: Family Medicine

## 2016-03-05 ENCOUNTER — Other Ambulatory Visit: Payer: Self-pay | Admitting: Family Medicine

## 2016-03-12 ENCOUNTER — Encounter (HOSPITAL_COMMUNITY): Payer: Self-pay

## 2016-03-18 ENCOUNTER — Other Ambulatory Visit: Payer: Self-pay | Admitting: Physician Assistant

## 2016-03-18 ENCOUNTER — Other Ambulatory Visit: Payer: Self-pay | Admitting: Family Medicine

## 2016-03-18 NOTE — Telephone Encounter (Signed)
Rx request to pharmacy/SLS  

## 2016-03-29 ENCOUNTER — Other Ambulatory Visit: Payer: Self-pay | Admitting: Physician Assistant

## 2016-03-29 ENCOUNTER — Other Ambulatory Visit: Payer: Self-pay | Admitting: Family Medicine

## 2016-03-29 NOTE — Telephone Encounter (Signed)
Will defer further refills of patient's medications to PCP  

## 2016-03-31 ENCOUNTER — Other Ambulatory Visit: Payer: Self-pay | Admitting: Endocrinology

## 2016-04-02 NOTE — Telephone Encounter (Signed)
Has not had ov in 6 months do you still want me to refill humalog... Please advise

## 2016-04-03 ENCOUNTER — Other Ambulatory Visit (HOSPITAL_BASED_OUTPATIENT_CLINIC_OR_DEPARTMENT_OTHER): Payer: MEDICARE

## 2016-04-03 ENCOUNTER — Ambulatory Visit (HOSPITAL_BASED_OUTPATIENT_CLINIC_OR_DEPARTMENT_OTHER): Payer: MEDICARE | Admitting: Hematology & Oncology

## 2016-04-03 ENCOUNTER — Encounter: Payer: Self-pay | Admitting: Hematology & Oncology

## 2016-04-03 VITALS — BP 127/71 | HR 82 | Temp 97.0°F | Resp 18 | Ht 62.0 in | Wt 181.0 lb

## 2016-04-03 DIAGNOSIS — C186 Malignant neoplasm of descending colon: Secondary | ICD-10-CM

## 2016-04-03 DIAGNOSIS — D500 Iron deficiency anemia secondary to blood loss (chronic): Secondary | ICD-10-CM

## 2016-04-03 DIAGNOSIS — D473 Essential (hemorrhagic) thrombocythemia: Secondary | ICD-10-CM

## 2016-04-03 DIAGNOSIS — E611 Iron deficiency: Secondary | ICD-10-CM

## 2016-04-03 DIAGNOSIS — E119 Type 2 diabetes mellitus without complications: Secondary | ICD-10-CM | POA: Diagnosis not present

## 2016-04-03 DIAGNOSIS — E083299 Diabetes mellitus due to underlying condition with mild nonproliferative diabetic retinopathy without macular edema, unspecified eye: Secondary | ICD-10-CM

## 2016-04-03 LAB — CMP (CANCER CENTER ONLY)
ALBUMIN: 3.8 g/dL (ref 3.3–5.5)
ALK PHOS: 68 U/L (ref 26–84)
ALT: 22 U/L (ref 10–47)
AST: 21 U/L (ref 11–38)
BILIRUBIN TOTAL: 1 mg/dL (ref 0.20–1.60)
BUN: 20 mg/dL (ref 7–22)
CO2: 29 mEq/L (ref 18–33)
CREATININE: 1.6 mg/dL — AB (ref 0.6–1.2)
Calcium: 9 mg/dL (ref 8.0–10.3)
Chloride: 103 mEq/L (ref 98–108)
Glucose, Bld: 121 mg/dL — ABNORMAL HIGH (ref 73–118)
Potassium: 3.5 mEq/L (ref 3.3–4.7)
SODIUM: 137 meq/L (ref 128–145)
TOTAL PROTEIN: 7.8 g/dL (ref 6.4–8.1)

## 2016-04-03 LAB — CBC WITH DIFFERENTIAL (CANCER CENTER ONLY)
BASO#: 0.1 10*3/uL (ref 0.0–0.2)
BASO%: 0.5 % (ref 0.0–2.0)
EOS%: 12.5 % — ABNORMAL HIGH (ref 0.0–7.0)
Eosinophils Absolute: 1.3 10*3/uL — ABNORMAL HIGH (ref 0.0–0.5)
HCT: 34.5 % — ABNORMAL LOW (ref 34.8–46.6)
HEMOGLOBIN: 11 g/dL — AB (ref 11.6–15.9)
LYMPH#: 2.5 10*3/uL (ref 0.9–3.3)
LYMPH%: 24.5 % (ref 14.0–48.0)
MCH: 28.1 pg (ref 26.0–34.0)
MCHC: 31.9 g/dL — ABNORMAL LOW (ref 32.0–36.0)
MCV: 88 fL (ref 81–101)
MONO#: 0.8 10*3/uL (ref 0.1–0.9)
MONO%: 7.7 % (ref 0.0–13.0)
NEUT%: 54.8 % (ref 39.6–80.0)
NEUTROS ABS: 5.6 10*3/uL (ref 1.5–6.5)
PLATELETS: 570 10*3/uL — AB (ref 145–400)
RBC: 3.91 10*6/uL (ref 3.70–5.32)
RDW: 14.9 % (ref 11.1–15.7)
WBC: 10.3 10*3/uL — AB (ref 3.9–10.0)

## 2016-04-03 LAB — HEMOGLOBIN A1C
ESTIMATED AVERAGE GLUCOSE: 143 mg/dL
HEMOGLOBIN A1C: 6.6 % — AB (ref 4.8–5.6)

## 2016-04-03 LAB — CEA (IN HOUSE-CHCC): CEA (CHCC-In House): 3.14 ng/mL (ref 0.00–5.00)

## 2016-04-03 LAB — IRON AND TIBC
%SAT: 18 % — AB (ref 21–57)
Iron: 62 ug/dL (ref 41–142)
TIBC: 335 ug/dL (ref 236–444)
UIBC: 274 ug/dL (ref 120–384)

## 2016-04-03 LAB — CHCC SATELLITE - SMEAR

## 2016-04-03 LAB — FERRITIN: FERRITIN: 25 ng/mL (ref 9–269)

## 2016-04-03 NOTE — Progress Notes (Signed)
Hematology and Oncology Follow Up Visit  Beth Maldonado [id number] [birth year] 70 y.o. 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 She is doing better. She feels a little better. She has had no problems with bleeding or bruising.  Only last saw her, her platelet count was quite elevated. I thought this was all from iron deficiency from her surgery. We checked her iron studies back in June, her ferritin was 39 with iron saturation of 13%. She is taking some oral iron. She is doing okay with the oral iron.   She's had no problem with fatigue or weakness. There's no cough or shortness of breath. She's had no nausea or vomiting. There's been no leg swelling.   Her CEA level we saw her back in June was 2.9.  Overall, her performance status is ECOG 0.  Medications:  Current Outpatient Prescriptions:  .  aspirin EC 81 MG tablet, Take 81 mg by mouth daily., Disp: , Rfl:  .  atorvastatin (LIPITOR) 10 MG tablet, TAKE 1 TABLET (10 MG TOTAL) BY MOUTH DAILY., Disp: 30 tablet, Rfl: 5 .  cyclobenzaprine (FLEXERIL) 5 MG tablet, TAKE 1 TABLET BY MOUTH ATBEDTIME AS NEEDED FOR MUSCLE SPASMS., Disp: 30 tablet, Rfl: 0 .  escitalopram (LEXAPRO) 20 MG tablet, Take 1 tablet by mouth  daily, Disp: 90 tablet, Rfl: 0 .  gabapentin (NEURONTIN) 300 MG capsule, TAKE 2 CAPSULES BY MOUTH 3 TIMES A DAY, Disp: 180 capsule, Rfl: 0 .  glucose blood (ONETOUCH VERIO) test strip, Use as instructed to check blood sugar 4 times per day dx code E11.9, Disp: 125 each, Rfl: 3 .  HUMALOG KWIKPEN 100 UNIT/ML KiwkPen, INJECT FIVE UNITS SUBCUTANEOUSLY WITH BREAKFAST AND DINNER, AND 7 UNITS AT LUNCH, Disp: 15 mL, Rfl: 0 .  hydrochlorothiazide (HYDRODIURIL) 25 MG tablet, TAKE 1 TABLET BY MOUTH ONCE A DAY, Disp: 30 tablet, Rfl: 6 .   HYDROcodone-acetaminophen (NORCO/VICODIN) 5-325 MG tablet, Take 1-2 tablets by mouth every 6 (six) hours as needed for moderate pain., Disp: 30 tablet, Rfl: 0 .  hydrOXYzine (ATARAX/VISTARIL) 25 MG tablet, TAKE 1 TABLET BY MOUTH TWICE A DAY, Disp: 60 tablet, Rfl: 0 .  Insulin Glargine (TOUJEO SOLOSTAR) 300 UNIT/ML SOPN, Inject 22 Units into the skin daily., Disp: 3 pen, Rfl: 3 .  Insulin Pen Needle 32G X 4 MM MISC, Use for insulin injection twice a day 250.00, Disp: 100 each, Rfl: 2 .  lidocaine (LIDODERM) 5 %, Place 1 patch onto the skin daily. Remove & Discard patch within 12 hours or as directed by MD (Patient taking differently: Place 1 patch onto the skin daily as needed (For pain.). Remove & Discard patch within 12 hours or as directed by MD), Disp: 30 patch, Rfl: 0 .  metFORMIN (GLUCOPHAGE) 500 MG tablet, TAKE 2 TABLETS BY MOUTH TWICE DAILY WITH MEALS, Disp: 120 tablet, Rfl: 6 .  Multiple Vitamin (MULTIVITAMIN) tablet, Take 1 tablet by mouth daily., Disp: , Rfl:  .  ONETOUCH DELICA LANCETS 33G MISC, Use as directed to check blood sugar 4 times per day dx code E11.9, Disp: 200 each, Rfl: 3 .  triamterene-hydrochlorothiazide (MAXZIDE-25) 37.5-25 MG tablet, TAKE 1 TABLET BY MOUTH DAILY., Disp: 30 tablet, Rfl: 6  Allergies:  Allergies  Allergen Reactions  . Penicillins Swelling and Other (See Comments)  tongue swelled per pt Has patient had a PCN reaction causing immediate rash, facial/tongue/throat swelling, SOB or lightheadedness with hypotension: yes Has patient had a PCN reaction causing severe rash involving mucus membranes or skin necrosis: no Has patient had a PCN reaction that required hospitalization no  Has patient had a PCN reaction occurring within the last 10 years: unsure If all of the above answers are "NO", then may proceed with Cephalosporin use.     Past Medical History, Surgical history, Social history, and Family History were reviewed and updated.  Review of  Systems: As above  Physical Exam:  height is '5\' 2"'$  (1.575 m) and weight is 181 lb (82.1 kg). Her oral temperature is 97 F (36.1 C). Her blood pressure is 127/71 and her pulse is 82. Her respiration is 18.   Wt Readings from Last 3 Encounters:  04/03/16 181 lb (82.1 kg)  01/01/16 179 lb (81.2 kg)  12/18/15 183 lb (83 kg)     Head and neck exam shows no ocular or oral lesions. There are no palpable cervical or supraclavicular lymph nodes. Lungs are clear. Cardiac exam regular rate and rhythm with no murmurs, rubs or bruits. Abdomen is soft and she has healing laparoscopy scar. There is no swelling or erythema. There is no guarding or rebound tenderness. Maybe some slight tenderness in the periumbilical region. No masses noted. There is no ascites. There is no palpable liver or spleen tip. Back exam shows no tenderness over the spine, ribs or hips. Extremities shows no clubbing, cyanosis or edema. Neurological exam shows no focal neurological deficits.   Lab Results  Component Value Date   WBC 10.3 (H) 04/03/2016   HGB 11.0 (L) 04/03/2016   HCT 34.5 (L) 04/03/2016   MCV 88 04/03/2016   PLT 570 (H) 04/03/2016     Chemistry      Component Value Date/Time   NA 137 04/03/2016 0909   K 3.5 04/03/2016 0909   CL 103 04/03/2016 0909   CO2 29 04/03/2016 0909   BUN 20 04/03/2016 0909   CREATININE 1.6 (H) 04/03/2016 0909      Component Value Date/Time   CALCIUM 9.0 04/03/2016 0909   ALKPHOS 68 04/03/2016 0909   AST 21 04/03/2016 0909   ALT 22 04/03/2016 0909   BILITOT 1.00 04/03/2016 0909         Impression and Plan: Beth Maldonado is A 70 year old white female. She has a history of stage II adenocarcinoma of the sigmoid colon. She really had no high risk features. She had 18 lymph nodes that were negative. She is MMR-P.  I'm glad that her blood counts coming down. The iron that she is taking is definitely helping her.  Her CEA level today is 3.1. This is holding steady.  I think  we'll probably get her back in 6 months now. I did this would be very reasonable.   Volanda Napoleon, MD 9/6/201710:07 AM

## 2016-04-04 ENCOUNTER — Encounter: Payer: Self-pay | Admitting: Nurse Practitioner

## 2016-04-04 LAB — CEA: CEA1: 4.2 ng/mL (ref 0.0–4.7)

## 2016-04-09 ENCOUNTER — Other Ambulatory Visit: Payer: Self-pay | Admitting: Endocrinology

## 2016-04-11 ENCOUNTER — Other Ambulatory Visit: Payer: Self-pay | Admitting: Family Medicine

## 2016-04-11 MED ORDER — INSULIN LISPRO 100 UNIT/ML (KWIKPEN): PEN_INJECTOR | SUBCUTANEOUS | 1 refills | Status: DC

## 2016-04-20 ENCOUNTER — Other Ambulatory Visit: Payer: Self-pay | Admitting: Family Medicine

## 2016-04-23 ENCOUNTER — Encounter: Payer: Self-pay | Admitting: Family Medicine

## 2016-04-23 ENCOUNTER — Ambulatory Visit (INDEPENDENT_AMBULATORY_CARE_PROVIDER_SITE_OTHER): Payer: MEDICARE | Admitting: Family Medicine

## 2016-04-23 VITALS — BP 140/68 | HR 85 | Temp 99.0°F | Ht 61.0 in | Wt 184.1 lb

## 2016-04-23 DIAGNOSIS — E083299 Diabetes mellitus due to underlying condition with mild nonproliferative diabetic retinopathy without macular edema, unspecified eye: Secondary | ICD-10-CM

## 2016-04-23 DIAGNOSIS — Z23 Encounter for immunization: Secondary | ICD-10-CM

## 2016-04-23 DIAGNOSIS — I10 Essential (primary) hypertension: Secondary | ICD-10-CM

## 2016-04-23 DIAGNOSIS — E785 Hyperlipidemia, unspecified: Secondary | ICD-10-CM

## 2016-04-23 DIAGNOSIS — D473 Essential (hemorrhagic) thrombocythemia: Secondary | ICD-10-CM

## 2016-04-23 DIAGNOSIS — D75839 Thrombocytosis, unspecified: Secondary | ICD-10-CM

## 2016-04-23 DIAGNOSIS — E669 Obesity, unspecified: Secondary | ICD-10-CM

## 2016-04-23 DIAGNOSIS — F418 Other specified anxiety disorders: Secondary | ICD-10-CM

## 2016-04-23 NOTE — Assessment & Plan Note (Signed)
Well controlled, no changes to meds. Encouraged heart healthy diet such as the DASH diet and exercise as tolerated.  °

## 2016-04-23 NOTE — Progress Notes (Signed)
Pre visit review using our clinic review tool, if applicable. No additional management support is needed unless otherwise documented below in the visit note. 

## 2016-04-23 NOTE — Patient Instructions (Signed)

## 2016-04-27 NOTE — Assessment & Plan Note (Signed)
Has been under a great deal of stress lately but does feel the escitalopram is helping.

## 2016-04-27 NOTE — Assessment & Plan Note (Signed)
hgba1c acceptable, minimize simple carbs. Increase exercise as tolerated. Continue current meds 

## 2016-04-27 NOTE — Progress Notes (Signed)
Patient ID: Beth Maldonado, female   DOB: Jul 10, 1946, 70 y.o.   MRN: [id number]   Subjective:    Patient ID: Beth Maldonado, female    DOB: 1945-11-22, 70 y.o.   MRN: [id number]  Chief Complaint  Patient presents with  . Follow-up    HPI Patient is in today for follow up. She is feeling well today, no recent illness or hospitalizations. She has been under stress with family concerns lately. Sugars have been better and she has been trying to eat better and minimize carbs. Denies CP/palp/SOB/HA/congestion/fevers/GI or GU c/o. Taking meds as prescribed Past Medical History:  Diagnosis Date  . Acute bronchitis 03/29/2013  . Acute upper respiratory infection 11/27/2014  . Allergy   . Anemia 07/02/2015  . Arthritis   . Back pain    upper and lower last 20 years  . Colon cancer ([place])   . Depression with anxiety 06/27/2013  . Diabetes mellitus   . Hyperlipidemia   . Hypertension   . Insomnia 06/30/2015  . Medicare annual wellness visit, subsequent 03/23/2014  . Neck pain on left side 05/29/2014   hx of  . Neuromuscular disorder (HCC)    neuropathy feet  . Neuropathy ([place])   . Osteopenia   . Preventative health care 07/02/2015  . Tinnitus    RIGHT EAR ALL THE TIME    Past Surgical History:  Procedure Laterality Date  . BREAST ENHANCEMENT SURGERY Bilateral 1987   Reduction  . CATARACT EXTRACTION, BILATERAL  2011  . COLONOSCOPY  2017   colon cancer  . [place]  . KNEE ARTHROSCOPY Right 2003  . LAPAROSCOPIC SIGMOID COLECTOMY N/A 12/18/2015   Procedure: LAPAROSCOPIC SIGMOID COLECTOMY;  Surgeon: Alphonsa Overall, MD;  Location: [facility];  Service: General;  Laterality: N/A;  . TUBAL LIGATION  1970  . VAGINAL HYSTERECTOMY  1974   partial    Family History  Problem Relation Age of Onset  . Diabetes Mother   . Heart attack Father   . Heart disease Father     5 and 65 MI  . Diabetes Sister   . Sickle cell anemia Sister   . Arthritis Sister   . Hypertension Sister   .  Diabetes Sister   . Thyroid disease Sister   . Arthritis Sister   . Glaucoma Sister   . Colon cancer Neg Hx     Social History   Social History  . Marital status: Married    Spouse name: N/A  . Number of children: 1  . Years of education: N/A   Occupational History  . retired    Social History Main Topics  . Smoking status: Former Smoker    Packs/day: 1.00    Years: 20.00    Types: Cigarettes    Start date: 10/03/1966    Quit date: 11/03/1986  . Smokeless tobacco: Never Used     Comment: quit 28 years ago  . Alcohol use 0.0 oz/week     Comment: Rare- Special Occasional   . Drug use: No  . Sexual activity: No     Comment: lives with husband, no dietary restrictions   Other Topics Concern  . Not on file   Social History Narrative  . No narrative on file    Outpatient Medications Prior to Visit  Medication Sig Dispense Refill  . aspirin EC 81 MG tablet Take 81 mg by mouth daily.    Marland Kitchen atorvastatin (LIPITOR) 10 MG tablet TAKE 1 TABLET (10 MG TOTAL)  BY MOUTH DAILY. 30 tablet 5  . cyclobenzaprine (FLEXERIL) 5 MG tablet TAKE 1 TABLET BY MOUTH ATBEDTIME AS NEEDED FOR MUSCLE SPASMS. 30 tablet 0  . escitalopram (LEXAPRO) 20 MG tablet Take 1 tablet by mouth  daily 90 tablet 0  . gabapentin (NEURONTIN) 300 MG capsule TAKE 2 CAPSULES BY MOUTH 3 TIMES A DAY 180 capsule 0  . glucose blood (ONETOUCH VERIO) test strip Use as instructed to check blood sugar 4 times per day dx code E11.9 125 each 3  . HYDROcodone-acetaminophen (NORCO/VICODIN) 5-325 MG tablet Take 1-2 tablets by mouth every 6 (six) hours as needed for moderate pain. 30 tablet 0  . hydrOXYzine (ATARAX/VISTARIL) 25 MG tablet TAKE 1 TABLET BY MOUTH TWICE A DAY 60 tablet 0  . Insulin Glargine (TOUJEO SOLOSTAR) 300 UNIT/ML SOPN Inject 22 Units into the skin daily. 3 pen 3  . insulin lispro (HUMALOG KWIKPEN) 100 UNIT/ML KiwkPen Inject Five units subcutaneously with breakfast and dinner and 7 units at lunch 15 mL 1  . Insulin Pen  Needle 32G X 4 MM MISC Use for insulin injection twice a day 250.00 100 each 2  . lidocaine (LIDODERM) 5 % Place 1 patch onto the skin daily. Remove & Discard patch within 12 hours or as directed by MD (Patient taking differently: Place 1 patch onto the skin daily as needed (For pain.). Remove & Discard patch within 12 hours or as directed by MD) 30 patch 0  . metFORMIN (GLUCOPHAGE) 500 MG tablet TAKE 2 TABLETS BY MOUTH TWICE DAILY WITH MEALS 120 tablet 6  . Multiple Vitamin (MULTIVITAMIN) tablet Take 1 tablet by mouth daily.    [name] DELICA LANCETS 99991111 MISC Use as directed to check blood sugar 4 times per day dx code E11.9 200 each 3  . triamterene-hydrochlorothiazide (MAXZIDE-25) 37.5-25 MG tablet TAKE 1 TABLET BY MOUTH DAILY. 30 tablet 6  . hydrochlorothiazide (HYDRODIURIL) 25 MG tablet TAKE 1 TABLET BY MOUTH ONCE A DAY 30 tablet 6   No facility-administered medications prior to visit.     Allergies  Allergen Reactions  . Penicillins Swelling and Other (See Comments)    tongue swelled per pt Has patient had a PCN reaction causing immediate rash, facial/tongue/throat swelling, SOB or lightheadedness with hypotension: yes Has patient had a PCN reaction causing severe rash involving mucus membranes or skin necrosis: no Has patient had a PCN reaction that required hospitalization no  Has patient had a PCN reaction occurring within the last 10 years: unsure If all of the above answers are "NO", then may proceed with Cephalosporin use.     Review of Systems  Constitutional: Negative for fever and malaise/fatigue.  HENT: Negative for congestion.   Eyes: Negative for blurred vision.  Respiratory: Negative for shortness of breath.   Cardiovascular: Negative for chest pain, palpitations and leg swelling.  Gastrointestinal: Negative for abdominal pain, blood in stool and nausea.  Genitourinary: Negative for dysuria and frequency.  Musculoskeletal: Negative for falls.  Skin: Negative for  rash.  Neurological: Negative for dizziness, loss of consciousness and headaches.  Endo/Heme/Allergies: Negative for environmental allergies.  Psychiatric/Behavioral: Positive for depression. The patient is nervous/anxious.        Objective:    Physical Exam  Constitutional: She is oriented to person, place, and time. She appears well-developed and well-nourished. No distress.  HENT:  Head: Normocephalic and atraumatic.  Nose: Nose normal.  Eyes: Right eye exhibits no discharge. Left eye exhibits no discharge.  Neck: Normal range of motion.  Neck supple.  Cardiovascular: Normal rate and regular rhythm.   No murmur heard. Pulmonary/Chest: Effort normal and breath sounds normal.  Abdominal: Soft. Bowel sounds are normal. There is no tenderness.  Musculoskeletal: She exhibits no edema.  Neurological: She is alert and oriented to person, place, and time.  Skin: Skin is warm and dry.  Psychiatric: She has a normal mood and affect.  Nursing note and vitals reviewed.   BP 140/68   Pulse 85   Temp 99 F (37.2 C) (Oral)   Ht 5\' 1"  (1.549 m)   Wt 184 lb 2 oz (83.5 kg)   SpO2 99%   BMI 34.79 kg/m  Wt Readings from Last 3 Encounters:  04/23/16 184 lb 2 oz (83.5 kg)  04/03/16 181 lb (82.1 kg)  01/01/16 179 lb (81.2 kg)     Lab Results  Component Value Date   WBC 10.3 (H) 04/03/2016   HGB 11.0 (L) 04/03/2016   HCT 34.5 (L) 04/03/2016   PLT 570 (H) 04/03/2016   GLUCOSE 121 (H) 04/03/2016   CHOL 170 01/01/2016   TRIG 151.0 (H) 01/01/2016   HDL 45.60 01/01/2016   LDLDIRECT 109.0 09/20/2015   LDLCALC 95 01/01/2016   ALT 22 04/03/2016   AST 21 04/03/2016   NA 137 04/03/2016   K 3.5 04/03/2016   CL 103 04/03/2016   CREATININE 1.6 (H) 04/03/2016   BUN 20 04/03/2016   CO2 29 04/03/2016   TSH 1.32 01/01/2016   HGBA1C 6.6 (H) 04/03/2016   MICROALBUR 1.1 06/27/2015    Lab Results  Component Value Date   TSH 1.32 01/01/2016   Lab Results  Component Value Date   WBC 10.3  (H) 04/03/2016   HGB 11.0 (L) 04/03/2016   HCT 34.5 (L) 04/03/2016   MCV 88 04/03/2016   PLT 570 (H) 04/03/2016   Lab Results  Component Value Date   NA 137 04/03/2016   K 3.5 04/03/2016   CO2 29 04/03/2016   GLUCOSE 121 (H) 04/03/2016   BUN 20 04/03/2016   CREATININE 1.6 (H) 04/03/2016   BILITOT 1.00 04/03/2016   ALKPHOS 68 04/03/2016   AST 21 04/03/2016   ALT 22 04/03/2016   PROT 7.8 04/03/2016   ALBUMIN 3.8 04/03/2016   CALCIUM 9.0 04/03/2016   ANIONGAP 7 12/20/2015   GFR 43.48 (L) 01/01/2016   Lab Results  Component Value Date   CHOL 170 01/01/2016   Lab Results  Component Value Date   HDL 45.60 01/01/2016   Lab Results  Component Value Date   LDLCALC 95 01/01/2016   Lab Results  Component Value Date   TRIG 151.0 (H) 01/01/2016   Lab Results  Component Value Date   CHOLHDL 4 01/01/2016   Lab Results  Component Value Date   HGBA1C 6.6 (H) 04/03/2016       Assessment & Plan:   Problem List Items Addressed This Visit    Diabetes mellitus ([place])    hgba1c acceptable, minimize simple carbs. Increase exercise as tolerated. Continue current meds      Relevant Orders   Hemoglobin A1c   Hypertension    Well controlled, no changes to meds. Encouraged heart healthy diet such as the DASH diet and exercise as tolerated.       Relevant Orders   TSH   Comprehensive metabolic panel   Hyperlipidemia    Tolerating statin, encouraged heart healthy diet, avoid trans fats, minimize simple carbs and saturated fats. Increase exercise as tolerated  Relevant Orders   Lipid panel   Depression with anxiety    Has been under a great deal of stress lately but does feel the escitalopram is helping.      Thrombocytosis ([place])   Obesity - Primary    Other Visit Diagnoses    Encounter for immunization       Relevant Orders   Flu vaccine HIGH DOSE PF (Completed)      I have discontinued Ms. Waldeck's hydrochlorothiazide. I am also having her maintain her  Insulin Pen Needle, multivitamin, ONETOUCH DELICA LANCETS 99991111, glucose blood, Insulin Glargine, lidocaine, metFORMIN, aspirin EC, triamterene-hydrochlorothiazide, HYDROcodone-acetaminophen, escitalopram, cyclobenzaprine, atorvastatin, hydrOXYzine, insulin lispro, and gabapentin.  No orders of the defined types were placed in this encounter.    Penni Homans, MD

## 2016-04-27 NOTE — Assessment & Plan Note (Signed)
Tolerating statin, encouraged heart healthy diet, avoid trans fats, minimize simple carbs and saturated fats. Increase exercise as tolerated 

## 2016-05-02 ENCOUNTER — Other Ambulatory Visit: Payer: Self-pay | Admitting: Family Medicine

## 2016-05-07 ENCOUNTER — Other Ambulatory Visit: Payer: Self-pay | Admitting: Endocrinology

## 2016-05-12 ENCOUNTER — Other Ambulatory Visit: Payer: Self-pay | Admitting: Endocrinology

## 2016-05-15 ENCOUNTER — Other Ambulatory Visit: Payer: Self-pay | Admitting: Endocrinology

## 2016-05-16 DIAGNOSIS — H524 Presbyopia: Secondary | ICD-10-CM | POA: Diagnosis not present

## 2016-05-16 DIAGNOSIS — Z794 Long term (current) use of insulin: Secondary | ICD-10-CM | POA: Diagnosis not present

## 2016-05-16 DIAGNOSIS — E119 Type 2 diabetes mellitus without complications: Secondary | ICD-10-CM | POA: Diagnosis not present

## 2016-05-21 ENCOUNTER — Other Ambulatory Visit: Payer: Self-pay | Admitting: Family Medicine

## 2016-06-03 ENCOUNTER — Telehealth: Payer: Self-pay | Admitting: Family Medicine

## 2016-06-03 ENCOUNTER — Other Ambulatory Visit: Payer: Self-pay | Admitting: Family Medicine

## 2016-06-03 MED ORDER — ESCITALOPRAM OXALATE 20 MG PO TABS: 20.0000 mg | ORAL_TABLET | Freq: Every day | ORAL | 0 refills | Status: DC

## 2016-06-03 NOTE — Telephone Encounter (Signed)
Relation to [id number] Call back [place]:  Reason for call:  Patient states mail order advised patient will not receive escitalopram (LEXAPRO) 20 MG tablet until 06/17/16 requesting a 2 week supply please send to [facility] # [address] [place] [phone] (Phone) [phone] (Fax)

## 2016-06-03 NOTE — Telephone Encounter (Signed)
Patient was written Rx for Triamterene-hydrochlorothiazide and Rx request for hydrocholoriathiazide was sent and I do not see Rx for that in patient chart.Please advise which one patient should be taking. TL/CMA

## 2016-06-10 ENCOUNTER — Other Ambulatory Visit: Payer: Self-pay | Admitting: Family Medicine

## 2016-06-25 ENCOUNTER — Other Ambulatory Visit: Payer: Self-pay | Admitting: Family Medicine

## 2016-07-10 ENCOUNTER — Other Ambulatory Visit: Payer: Self-pay | Admitting: Family Medicine

## 2016-07-23 ENCOUNTER — Other Ambulatory Visit: Payer: Self-pay | Admitting: Family Medicine

## 2016-07-31 NOTE — Progress Notes (Signed)
Pre visit review using our clinic review tool, if applicable. No additional management support is needed unless otherwise documented below in the visit note. 

## 2016-07-31 NOTE — Progress Notes (Signed)
Subjective:   Beth Maldonado is a 71 y.o. female who presents for Medicare Annual (Subsequent) preventive examination.  Review of Systems:  No ROS.  Medicare Wellness Visit.  Cardiac Risk Factors include: advanced age (>22men, >43 women);diabetes mellitus;dyslipidemia;obesity (BMI >30kg/m2);sedentary lifestyle;hypertension  Sleep patterns: falls asleep easily, has restless sleep, feels rested on waking, gets up 1-2 times nightly to void and about 4 hours nightly. Gets up about 6:30 each morning.  Home Safety/Smoke Alarms: Feels safe in home. Smoke alarms in place.    Living environment; residence and Firearm Safety: Lives in home w/ husband and dog. 1-story house/ trailer, can live on one level, firearms stored safely. Seat Belt Safety/Bike Helmet: Wears seat belt.   Counseling:   Eye Exam- Follows w/ Beth Maldonado yearly.   Dental- Does not follow w/ dentist. Dentures upper.   Female:   Pap- Hysterectomy       Mammo- last 02/14/16, BI-RADS category 0: Incomplete. 02/22/16 diagnostic MMG-BI-RADS Category 0: incomplete. [place] completed > BI-RADS Category 1: Negative. Recommendations for screening MMG in 1 year.      Dexa scan- last 05/12/07. Stable osteopenia.       CCS- last 10/27/15 w/ Beth Maldonado. Decreased sphincter tone on digital rectal exam. Malignant tumor in the proximal sigmoid colon. Two  polyps in the mid descending colon, removed. Severe diverticulosis in the left colon. 1 year recall.     Objective:     Vitals: BP 140/66 (BP Location: Right Arm, Patient Position: Sitting, Cuff Size: Normal)   Pulse 88   Resp 16   Ht 5\' 2"  (1.575 m)   Wt 186 lb (84.4 kg)   SpO2 98%   BMI 34.02 kg/m   Body mass index is 34.02 kg/m.   Tobacco History  Smoking Status  . Former Smoker  . Packs/day: 1.00  . Years: 20.00  . Types: Cigarettes  . Start date: 10/03/1966  . Quit date: 11/03/1986  Smokeless Tobacco  . Never Used    Comment: quit 28 years ago     Counseling given: Not  Answered   Past Medical History:  Diagnosis Date  . Acute bronchitis 03/29/2013  . Acute upper respiratory infection 11/27/2014  . Allergy   . Anemia 07/02/2015  . Arthritis   . Back pain    upper and lower last 20 years  . Colon cancer ([place])   . Depression with anxiety 06/27/2013  . Diabetes mellitus   . Hyperlipidemia   . Hypertension   . Insomnia 06/30/2015  . Medicare annual wellness visit, subsequent 03/23/2014  . Neck pain on left side 05/29/2014   hx of  . Neuromuscular disorder (HCC)    neuropathy feet  . Neuropathy ([place])   . Osteopenia   . Preventative health care 07/02/2015  . Tinnitus    RIGHT EAR ALL THE TIME   Past Surgical History:  Procedure Laterality Date  . BREAST ENHANCEMENT SURGERY Bilateral 1987   Reduction  . CATARACT EXTRACTION, BILATERAL  2011  . COLONOSCOPY  2017   colon cancer  . Beth Maldonado  . KNEE ARTHROSCOPY Right 2003  . LAPAROSCOPIC SIGMOID COLECTOMY N/A 12/18/2015   Procedure: LAPAROSCOPIC SIGMOID COLECTOMY;  Surgeon: Beth Overall, MD;  Location: [facility];  Service: General;  Laterality: N/A;  . TUBAL LIGATION  1970  . VAGINAL HYSTERECTOMY  1974   partial   Family History  Problem Relation Age of Onset  . Diabetes Mother   . Heart attack Father   .  Heart disease Father     56 and 50 MI  . Diabetes Sister   . Sickle cell anemia Sister   . Arthritis Sister   . Hypertension Sister   . Diabetes Sister   . Thyroid disease Sister   . Arthritis Sister   . Glaucoma Sister   . Colon cancer Neg Hx    History  Sexual Activity  . Sexual activity: Not on file    Comment: lives with husband, no dietary restrictions    Outpatient Encounter Prescriptions as of 08/01/2016  Medication Sig  . aspirin EC 81 MG tablet Take 81 mg by mouth daily.  Marland Kitchen atorvastatin (LIPITOR) 10 MG tablet TAKE 1 TABLET (10 MG TOTAL) BY MOUTH DAILY.  . cyclobenzaprine (FLEXERIL) 5 MG tablet TAKE 1 TABLET BY MOUTH ATBEDTIME AS NEEDED FOR MUSCLE SPASMS.  Marland Kitchen  escitalopram (LEXAPRO) 20 MG tablet Take 1 tablet (20 mg total) by mouth daily.  Marland Kitchen gabapentin (NEURONTIN) 300 MG capsule TAKE 2 CAPSULES BY MOUTH 3 TIMES A DAY  . glucose blood (ONETOUCH VERIO) test strip Use as instructed to check blood sugar 4 times per day dx code E11.9  . hydrochlorothiazide (HYDRODIURIL) 25 MG tablet TAKE 1 TABLET BY MOUTH ONCE A DAY  . hydrOXYzine (ATARAX/VISTARIL) 25 MG tablet TAKE 1 TABLET BY MOUTH TWICE A DAY  . insulin lispro (HUMALOG KWIKPEN) 100 UNIT/ML KiwkPen Inject Five units subcutaneously with breakfast and dinner and 7 units at lunch  . Insulin Pen Needle 32G X 4 MM MISC Use for insulin injection twice a day 250.00  . lidocaine (LIDODERM) 5 % Place 1 patch onto the skin daily. Remove & Discard patch within 12 hours or as directed by MD (Patient taking differently: Place 1 patch onto the skin daily as needed (For pain.). Remove & Discard patch within 12 hours or as directed by MD)  . metFORMIN (GLUCOPHAGE) 500 MG tablet TAKE 2 TABLETS BY MOUTH TWICE DAILY WITH MEALS  . Multiple Vitamin (MULTIVITAMIN) tablet Take 1 tablet by mouth daily.  Beth Maldonado DELICA LANCETS 99991111 MISC Use as directed to check blood sugar 4 times per day dx code E11.9  . triamterene-hydrochlorothiazide (MAXZIDE-25) 37.5-25 MG tablet TAKE 1 TABLET BY MOUTH DAILY.  Marland Kitchen HYDROcodone-acetaminophen (NORCO/VICODIN) 5-325 MG tablet Take 1-2 tablets by mouth every 6 (six) hours as needed for moderate pain. (Patient not taking: Reported on 08/01/2016)  . Insulin Glargine (TOUJEO SOLOSTAR) 300 UNIT/ML SOPN Inject 22 Units into the skin daily. (Patient not taking: Reported on 08/01/2016)   No facility-administered encounter medications on file as of 08/01/2016.     Activities of Daily Living In your present state of health, do you have any difficulty performing the following activities: 08/01/2016 12/18/2015  Hearing? N N  Vision? N N  Difficulty concentrating or making decisions? N N  Walking or climbing stairs?  N Y  Dressing or bathing? N N  Doing errands, shopping? N N  Preparing Food and eating ? N -  Using the Toilet? N -  In the past six months, have you accidently leaked urine? N -  Do you have problems with loss of bowel control? N -  Managing your Medications? N -  Managing your Finances? N -  Housekeeping or managing your Housekeeping? N -  Some recent data might be hidden    Patient Care Team: Mosie Lukes, MD as PCP - General (Family Medicine) Volanda Napoleon, MD as Consulting Physician (Oncology) Gatha Mayer, MD as Consulting Physician (Gastroenterology)  Calvert Cantor, MD as Consulting Physician (Ophthalmology) Beth Overall, MD as Consulting Physician (General Surgery)    Assessment:    Physical assessment deferred to PCP.  Exercise Activities and Dietary recommendations Current Exercise Habits: Home exercise routine, Frequency (Times/Week): 7, Intensity: Mild  Diet (meal preparation, eat out, water intake, caffeinated beverages, dairy products, fruits and vegetables): on average, 2 meals per day. Drinks 8 cups of coffee daily, trying to decrease coffee intake. Drinks about 32 oz water daily. Likes fresh fruits and vegetables-carrots, celery, radishes. Eats lots of salads. Admits to eating large portions.  Breakfast: oatmeal, eggs, [place] bacon, toast Lunch: popcorn shrimp, fries, cole slaw Dinner: snack dinner    Snacks: cottage cheese twice daily w/ unsweetened apple sauce and cinnamon   Goals    . Increase physical activity    . Increase water intake    . Reduce portion size      Fall Risk Fall Risk  08/01/2016 04/23/2016 04/03/2016 01/01/2016 06/02/2015  Falls in the past year? No No No No Yes  Number falls in past yr: - - - - 2 or more  Injury with Fall? - - - - No  Risk for fall due to : - - - - -  Follow up - - - - Education provided;Falls prevention discussed   Depression Screen PHQ 2/9 Scores 08/01/2016 04/23/2016 06/02/2015 11/18/2014  PHQ - 2 Score 1 0 0 0       Cognitive Function MMSE - Mini Mental State Exam 08/01/2016  Orientation to time 5  Orientation to Place 5  Registration 3  Attention/ Calculation 5  Recall 2  Language- name 2 objects 2  Language- repeat 1  Language- follow 3 step command 3  Language- read & follow direction 1  Write a sentence 1  Copy design 1  Total score 29        Immunization History  Administered Date(s) Administered  . Influenza Split 05/21/2011, 06/18/2012  . Influenza Whole 05/12/2013  . Influenza, High Dose Seasonal PF 04/23/2016  . Influenza,inj,Quad PF,36+ Mos 03/21/2014  . Influenza-Unspecified 04/29/2015  . Pneumococcal Conjugate-13 03/21/2014  . Pneumococcal Polysaccharide-23 05/21/2011   Screening Tests Health Maintenance  Topic Date Due  . OPHTHALMOLOGY EXAM  04/28/2016  . URINE MICROALBUMIN  06/26/2016  . FOOT EXAM  07/09/2016  . ZOSTAVAX  01/29/2017 (Originally 01/06/2006)  . HEMOGLOBIN A1C  10/01/2016  . COLONOSCOPY  10/26/2016  . MAMMOGRAM  02/21/2018  . TETANUS/TDAP  08/08/2019  . INFLUENZA VACCINE  Completed  . DEXA SCAN  Completed  . Hepatitis C Screening  Completed  . PNA vac Low Risk Adult  Completed      Plan:    Follow-up w/ PCP as scheduled.  Bring a copy of your advance directives to your next office visit.  Eat heart healthy diet (full of fruits, vegetables, whole grains, lean protein, water--limit salt, fat, and sugar intake) and increase physical activity as tolerated.  Bone density w/ next MMG-pt to call for orders.  Eye exam requested from [facility]. Urine micro today.  Dental resource list provided.  During the course of the visit the patient was educated and counseled about the following appropriate screening and preventive services:   Vaccines to include Pneumoccal, Influenza, Hepatitis B, Td, Zostavax, HCV  Cardiovascular Disease  Colorectal cancer screening  Bone density screening  Diabetes screening  Glaucoma  screening  Mammography/PAP  Nutrition counseling   Patient Instructions (the written plan) was given to the patient.   Derenda Mis  Ermalinda Barrios, RN  08/01/2016

## 2016-08-01 ENCOUNTER — Other Ambulatory Visit (INDEPENDENT_AMBULATORY_CARE_PROVIDER_SITE_OTHER): Payer: MEDICARE

## 2016-08-01 ENCOUNTER — Ambulatory Visit (INDEPENDENT_AMBULATORY_CARE_PROVIDER_SITE_OTHER): Payer: MEDICARE | Admitting: *Deleted

## 2016-08-01 ENCOUNTER — Encounter: Payer: Self-pay | Admitting: *Deleted

## 2016-08-01 VITALS — BP 140/66 | HR 88 | Resp 16 | Ht 62.0 in | Wt 186.0 lb

## 2016-08-01 DIAGNOSIS — E1169 Type 2 diabetes mellitus with other specified complication: Secondary | ICD-10-CM | POA: Diagnosis not present

## 2016-08-01 DIAGNOSIS — E785 Hyperlipidemia, unspecified: Secondary | ICD-10-CM | POA: Diagnosis not present

## 2016-08-01 DIAGNOSIS — I10 Essential (primary) hypertension: Secondary | ICD-10-CM | POA: Diagnosis not present

## 2016-08-01 DIAGNOSIS — E083299 Diabetes mellitus due to underlying condition with mild nonproliferative diabetic retinopathy without macular edema, unspecified eye: Secondary | ICD-10-CM

## 2016-08-01 DIAGNOSIS — E669 Obesity, unspecified: Secondary | ICD-10-CM

## 2016-08-01 DIAGNOSIS — Z0000 Encounter for general adult medical examination without abnormal findings: Secondary | ICD-10-CM | POA: Diagnosis not present

## 2016-08-01 LAB — TSH: TSH: 2.84 u[IU]/mL (ref 0.35–4.50)

## 2016-08-01 LAB — LIPID PANEL
CHOL/HDL RATIO: 3
Cholesterol: 145 mg/dL (ref 0–200)
HDL: 49.7 mg/dL (ref >39.00)
LDL Cholesterol: 72 mg/dL (ref 0–99)
NonHDL: 94.91
TRIGLYCERIDES: 114 mg/dL (ref 0.0–149.0)
VLDL: 22.8 mg/dL (ref 0.0–40.0)

## 2016-08-01 LAB — COMPREHENSIVE METABOLIC PANEL
ALBUMIN: 4.6 g/dL (ref 3.5–5.2)
ALK PHOS: 66 U/L (ref 39–117)
ALT: 16 U/L (ref 0–35)
AST: 14 U/L (ref 0–37)
BILIRUBIN TOTAL: 0.6 mg/dL (ref 0.2–1.2)
BUN: 25 mg/dL — ABNORMAL HIGH (ref 6–23)
CALCIUM: 10.1 mg/dL (ref 8.4–10.5)
CO2: 30 mEq/L (ref 19–32)
Chloride: 102 mEq/L (ref 96–112)
Creatinine, Ser: 1.29 mg/dL — ABNORMAL HIGH (ref 0.40–1.20)
GFR: 52.46 mL/min — AB (ref >60.00)
GLUCOSE: 226 mg/dL — AB (ref 70–99)
POTASSIUM: 4.2 meq/L (ref 3.5–5.1)
Sodium: 143 mEq/L (ref 135–145)
TOTAL PROTEIN: 7.9 g/dL (ref 6.0–8.3)

## 2016-08-01 LAB — MICROALBUMIN / CREATININE URINE RATIO
CREATININE, U: 110.6 mg/dL
MICROALB/CREAT RATIO: 0.8 mg/g (ref 0.0–30.0)
Microalb, Ur: 0.9 mg/dL (ref 0.0–1.9)

## 2016-08-01 LAB — HEMOGLOBIN A1C: HEMOGLOBIN A1C: 7 % — AB (ref 4.6–6.5)

## 2016-08-01 NOTE — Progress Notes (Signed)
RN AWV note reviewed. Agree with documention and plan. 

## 2016-08-01 NOTE — Assessment & Plan Note (Signed)
BP Readings from Last 3 Encounters:  08/01/16 140/66  04/27/16 140/68  04/03/16 127/71   BP borderline today. She has taken meds today. She does not regularly check BP at home. Ongoing management per PCP.

## 2016-08-01 NOTE — Assessment & Plan Note (Signed)
Last A1c acceptable. Labs today per PCP. Pt reports she has not taken Toujeo in over a week-this med is not affordable for her. She would like to discuss alternative med at next appt. She desires for PCP to manage DM and does not wish to follow w/ endo-she will discuss w/ PCP at next appt. Checking fasting CBGs at home-ranging from 66-200. She is having hypoglycemic episodes about once every 2 weeks, lowest reading 66. Episodes respond to adding sugar to her coffee. Encouraged healthy diet and regular exercise for blood sugar control. Ongoing management/recs per PCP.

## 2016-08-01 NOTE — Patient Instructions (Signed)
  Beth Maldonado , Thank you for taking time to come for your Medicare Wellness Visit. I appreciate your ongoing commitment to your health goals. Please review the following plan we discussed and let me know if I can assist you in the future.   Please continue to check your blood sugars at home and bring your log to your next appointment.  Bring a copy of your advance directives to your next office visit.  These are the goals we discussed: Goals    . Increase physical activity    . Increase water intake    . Reduce portion size       This is a list of the screening recommended for you and due dates:  Health Maintenance  Topic Date Due  . Eye exam for diabetics  04/28/2016  . Urine Protein Check  06/26/2016  . Complete foot exam   07/09/2016  . Shingles Vaccine  01/29/2017*  . Hemoglobin A1C  10/01/2016  . Colon Cancer Screening  10/26/2016  . Mammogram  02/21/2018  . Tetanus Vaccine  08/08/2019  . Flu Shot  Completed  . DEXA scan (bone density measurement)  Completed  .  Hepatitis C: One time screening is recommended by Center for Disease Control  (CDC) for  adults born from [birth year] through 1965.   Completed  . Pneumonia vaccines  Completed  *Topic was postponed. The date shown is not the original due date.

## 2016-08-06 ENCOUNTER — Ambulatory Visit: Payer: MEDICARE | Admitting: Family Medicine

## 2016-08-06 ENCOUNTER — Encounter: Payer: Self-pay | Admitting: Family Medicine

## 2016-08-06 ENCOUNTER — Ambulatory Visit (INDEPENDENT_AMBULATORY_CARE_PROVIDER_SITE_OTHER): Payer: MEDICARE | Admitting: Family Medicine

## 2016-08-06 DIAGNOSIS — E6609 Other obesity due to excess calories: Secondary | ICD-10-CM | POA: Diagnosis not present

## 2016-08-06 DIAGNOSIS — E083299 Diabetes mellitus due to underlying condition with mild nonproliferative diabetic retinopathy without macular edema, unspecified eye: Secondary | ICD-10-CM

## 2016-08-06 DIAGNOSIS — M8580 Other specified disorders of bone density and structure, unspecified site: Secondary | ICD-10-CM

## 2016-08-06 DIAGNOSIS — E782 Mixed hyperlipidemia: Secondary | ICD-10-CM | POA: Diagnosis not present

## 2016-08-06 DIAGNOSIS — I10 Essential (primary) hypertension: Secondary | ICD-10-CM

## 2016-08-06 NOTE — Assessment & Plan Note (Signed)
Encouraged to get adequate exercise, calcium and vitamin d intake 

## 2016-08-06 NOTE — Progress Notes (Signed)
Pre visit review using our clinic review tool, if applicable. No additional management support is needed unless otherwise documented below in the visit note. 

## 2016-08-06 NOTE — Assessment & Plan Note (Signed)
Tolerating statin, encouraged heart healthy diet, avoid trans fats, minimize simple carbs and saturated fats. Increase exercise as tolerated 

## 2016-08-06 NOTE — Assessment & Plan Note (Signed)
Well controlled, no changes to meds. Encouraged heart healthy diet such as the DASH diet and exercise as tolerated.  °

## 2016-08-06 NOTE — Assessment & Plan Note (Signed)
hgba1c acceptable, minimize simple carbs. Increase exercise as tolerated. Continue current meds 

## 2016-08-06 NOTE — Progress Notes (Signed)
Patient ID: Beth Maldonado, female   DOB: 03/19/1946, 71 y.o.   MRN: [id number]

## 2016-08-06 NOTE — Progress Notes (Signed)
Subjective:    Patient ID: Beth Maldonado, female    DOB: 07/08/1946, 71 y.o.   MRN: [id number]  Chief Complaint  Patient presents with  . Follow-up    69-month F/U (Diabetes).    HPI Patient is in today for 71-month follow up (diabetes). No acute concerns noted. She feels well and denies any recent hospitalizations or acute febrile illnesses. She is trying to stay active and maintain a heart healthy diet. She denies any polyuria or polydipsia. Denies CP/palp/SOB/HA/congestion/fevers/GI or GU c/o. Taking meds as prescribed  Past Medical History:  Diagnosis Date  . Acute bronchitis 03/29/2013  . Acute upper respiratory infection 11/27/2014  . Allergy   . Anemia 07/02/2015  . Arthritis   . Back pain    upper and lower last 20 years  . Colon cancer ([place])   . Depression with anxiety 06/27/2013  . Diabetes mellitus   . Hyperlipidemia   . Hypertension   . Insomnia 06/30/2015  . Medicare annual wellness visit, subsequent 03/23/2014  . Neck pain on left side 05/29/2014   hx of  . Neuromuscular disorder ([facility])    neuropathy feet  . Neuropathy ([place])   . Osteopenia   . Preventative health care 07/02/2015  . Tinnitus    RIGHT EAR ALL THE TIME    Past Surgical History:  Procedure Laterality Date  . BREAST ENHANCEMENT SURGERY Bilateral 1987   Reduction  . CATARACT EXTRACTION, BILATERAL  2011  . COLONOSCOPY  2017   colon cancer  . [place]  . KNEE ARTHROSCOPY Right 2003  . LAPAROSCOPIC SIGMOID COLECTOMY N/A 12/18/2015   Procedure: LAPAROSCOPIC SIGMOID COLECTOMY;  Surgeon: Alphonsa Overall, MD;  Location: [facility];  Service: General;  Laterality: N/A;  . TUBAL LIGATION  1970  . VAGINAL HYSTERECTOMY  1974   partial    Family History  Problem Relation Age of Onset  . Diabetes Mother   . Heart attack Father   . Heart disease Father     3 and 28 MI  . Diabetes Sister   . Sickle cell anemia Sister   . Arthritis Sister   . Hypertension Sister   . Diabetes Sister   .  Thyroid disease Sister   . Arthritis Sister   . Glaucoma Sister   . Colon cancer Neg Hx     Social History   Social History  . Marital status: Married    Spouse name: N/A  . Number of children: 1  . Years of education: N/A   Occupational History  . retired    Social History Main Topics  . Smoking status: Former Smoker    Packs/day: 1.00    Years: 20.00    Types: Cigarettes    Start date: 10/03/1966    Quit date: 11/03/1986  . Smokeless tobacco: Never Used     Comment: quit 28 years ago  . Alcohol use 0.0 oz/week     Comment: Rare- Special Occasional   . Drug use: No  . Sexual activity: Not on file     Comment: lives with husband, no dietary restrictions   Other Topics Concern  . Not on file   Social History Narrative  . No narrative on file    Outpatient Medications Prior to Visit  Medication Sig Dispense Refill  . aspirin EC 81 MG tablet Take 81 mg by mouth daily.    Marland Kitchen atorvastatin (LIPITOR) 10 MG tablet TAKE 1 TABLET (10 MG TOTAL) BY MOUTH DAILY. 30 tablet 5  .  cyclobenzaprine (FLEXERIL) 5 MG tablet TAKE 1 TABLET BY MOUTH ATBEDTIME AS NEEDED FOR MUSCLE SPASMS. 30 tablet 0  . escitalopram (LEXAPRO) 20 MG tablet Take 1 tablet (20 mg total) by mouth daily. 30 tablet 0  . gabapentin (NEURONTIN) 300 MG capsule TAKE 2 CAPSULES BY MOUTH 3 TIMES A DAY 180 capsule 2  . glucose blood (ONETOUCH VERIO) test strip Use as instructed to check blood sugar 4 times per day dx code E11.9 125 each 3  . hydrochlorothiazide (HYDRODIURIL) 25 MG tablet TAKE 1 TABLET BY MOUTH ONCE A DAY 30 tablet 6  . HYDROcodone-acetaminophen (NORCO/VICODIN) 5-325 MG tablet Take 1-2 tablets by mouth every 6 (six) hours as needed for moderate pain. 30 tablet 0  . hydrOXYzine (ATARAX/VISTARIL) 25 MG tablet TAKE 1 TABLET BY MOUTH TWICE A DAY 60 tablet 0  . Insulin Glargine (TOUJEO SOLOSTAR) 300 UNIT/ML SOPN Inject 22 Units into the skin daily. 3 pen 3  . insulin lispro (HUMALOG KWIKPEN) 100 UNIT/ML KiwkPen  Inject Five units subcutaneously with breakfast and dinner and 7 units at lunch 15 mL 1  . Insulin Pen Needle 32G X 4 MM MISC Use for insulin injection twice a day 250.00 100 each 2  . lidocaine (LIDODERM) 5 % Place 1 patch onto the skin daily. Remove & Discard patch within 12 hours or as directed by MD (Patient taking differently: Place 1 patch onto the skin daily as needed (For pain.). Remove & Discard patch within 12 hours or as directed by MD) 30 patch 0  . metFORMIN (GLUCOPHAGE) 500 MG tablet TAKE 2 TABLETS BY MOUTH TWICE DAILY WITH MEALS 120 tablet 6  . Multiple Vitamin (MULTIVITAMIN) tablet Take 1 tablet by mouth daily.    [name] DELICA LANCETS 99991111 MISC Use as directed to check blood sugar 4 times per day dx code E11.9 200 each 3  . triamterene-hydrochlorothiazide (MAXZIDE-25) 37.5-25 MG tablet TAKE 1 TABLET BY MOUTH DAILY. 30 tablet 6   No facility-administered medications prior to visit.     Allergies  Allergen Reactions  . Penicillins Swelling and Other (See Comments)    tongue swelled per pt Has patient had a PCN reaction causing immediate rash, facial/tongue/throat swelling, SOB or lightheadedness with hypotension: yes Has patient had a PCN reaction causing severe rash involving mucus membranes or skin necrosis: no Has patient had a PCN reaction that required hospitalization no  Has patient had a PCN reaction occurring within the last 10 years: unsure If all of the above answers are "NO", then may proceed with Cephalosporin use.     Review of Systems  Constitutional: Negative for fever and malaise/fatigue.  HENT: Negative for congestion.   Eyes: Negative for blurred vision.  Respiratory: Negative for cough and shortness of breath.   Cardiovascular: Negative for chest pain, palpitations and leg swelling.  Gastrointestinal: Negative for vomiting.  Musculoskeletal: Negative for back pain.  Skin: Negative for rash.  Neurological: Negative for seizures, loss of  consciousness and headaches.  Psychiatric/Behavioral: The patient does not have insomnia.        Objective:    Physical Exam  Constitutional: She is oriented to person, place, and time. She appears well-developed and well-nourished. No distress.  HENT:  Head: Normocephalic and atraumatic.  Eyes: Conjunctivae are normal.  Neck: Normal range of motion. No thyromegaly present.  Cardiovascular: Normal rate and regular rhythm.   Pulmonary/Chest: Effort normal and breath sounds normal. She has no wheezes.  Abdominal: Soft. Bowel sounds are normal. There is no tenderness.  Musculoskeletal: She exhibits no edema or deformity.  Neurological: She is alert and oriented to person, place, and time.  Skin: Skin is warm and dry. She is not diaphoretic.  Psychiatric: She has a normal mood and affect.    BP 132/60 (BP Location: Left Arm, Patient Position: Sitting, Cuff Size: Large)   Pulse 91   Temp 98.3 F (36.8 C) (Oral)   Wt 186 lb (84.4 kg)   SpO2 96% Comment: RA  BMI 34.02 kg/m  Wt Readings from Last 3 Encounters:  08/06/16 186 lb (84.4 kg)  08/01/16 186 lb (84.4 kg)  04/23/16 184 lb 2 oz (83.5 kg)     Lab Results  Component Value Date   WBC 10.3 (H) 04/03/2016   HGB 11.0 (L) 04/03/2016   HCT 34.5 (L) 04/03/2016   PLT 570 (H) 04/03/2016   GLUCOSE 226 (H) 08/01/2016   CHOL 145 08/01/2016   TRIG 114.0 08/01/2016   HDL 49.70 08/01/2016   LDLDIRECT 109.0 09/20/2015   LDLCALC 72 08/01/2016   ALT 16 08/01/2016   AST 14 08/01/2016   NA 143 08/01/2016   K 4.2 08/01/2016   CL 102 08/01/2016   CREATININE 1.29 (H) 08/01/2016   BUN 25 (H) 08/01/2016   CO2 30 08/01/2016   TSH 2.84 08/01/2016   HGBA1C 7.0 (H) 08/01/2016   MICROALBUR 0.9 08/01/2016    Lab Results  Component Value Date   TSH 2.84 08/01/2016   Lab Results  Component Value Date   WBC 10.3 (H) 04/03/2016   HGB 11.0 (L) 04/03/2016   HCT 34.5 (L) 04/03/2016   MCV 88 04/03/2016   PLT 570 (H) 04/03/2016   Lab  Results  Component Value Date   NA 143 08/01/2016   K 4.2 08/01/2016   CO2 30 08/01/2016   GLUCOSE 226 (H) 08/01/2016   BUN 25 (H) 08/01/2016   CREATININE 1.29 (H) 08/01/2016   BILITOT 0.6 08/01/2016   ALKPHOS 66 08/01/2016   AST 14 08/01/2016   ALT 16 08/01/2016   PROT 7.9 08/01/2016   ALBUMIN 4.6 08/01/2016   CALCIUM 10.1 08/01/2016   ANIONGAP 7 12/20/2015   GFR 52.46 (L) 08/01/2016   Lab Results  Component Value Date   CHOL 145 08/01/2016   Lab Results  Component Value Date   HDL 49.70 08/01/2016   Lab Results  Component Value Date   LDLCALC 72 08/01/2016   Lab Results  Component Value Date   TRIG 114.0 08/01/2016   Lab Results  Component Value Date   CHOLHDL 3 08/01/2016   Lab Results  Component Value Date   HGBA1C 7.0 (H) 08/01/2016      I acted as a Education administrator for Dr. Charlett Blake. Raiford Noble, RMA  Assessment & Plan:   Problem List Items Addressed This Visit    Diabetes mellitus ([place])    hgba1c acceptable, minimize simple carbs. Increase exercise as tolerated. Continue current meds      Relevant Orders   Hemoglobin A1c   Hypertension    Well controlled, no changes to meds. Encouraged heart healthy diet such as the DASH diet and exercise as tolerated.       Relevant Orders   CBC   Comprehensive metabolic panel   TSH   Hyperlipidemia    Tolerating statin, encouraged heart healthy diet, avoid trans fats, minimize simple carbs and saturated fats. Increase exercise as tolerated      Relevant Orders   Lipid panel   Osteopenia    Encouraged to get adequate  exercise, calcium and vitamin d intake      Obesity    Encouraged DASH diet, decrease po intake and increase exercise as tolerated. Needs 7-8 hours of sleep nightly. Avoid trans fats, eat small, frequent meals every 4-5 hours with lean proteins, complex carbs and healthy fats. Minimize simple carbs         I am having Ms. Gagliano maintain her Insulin Pen Needle, multivitamin, ONETOUCH DELICA LANCETS  99991111, glucose blood, Insulin Glargine, lidocaine, aspirin EC, HYDROcodone-acetaminophen, atorvastatin, insulin lispro, escitalopram, hydrochlorothiazide, metFORMIN, gabapentin, cyclobenzaprine, hydrOXYzine, and triamterene-hydrochlorothiazide.  No orders of the defined types were placed in this encounter.   CMA served as Education administrator during this visit. History, Physical and Plan performed by medical provider. Documentation and orders reviewed and attested to.  Penni Homans, MD

## 2016-08-07 NOTE — Patient Instructions (Signed)
Carbohydrate Counting for Diabetes Mellitus, Adult Carbohydrate counting is a method for keeping track of how many carbohydrates you eat. Eating carbohydrates naturally increases the amount of sugar (glucose) in the blood. Counting how many carbohydrates you eat helps keep your blood glucose within normal limits, which helps you manage your diabetes (diabetes mellitus). It is important to know how many carbohydrates you can safely have in each meal. This is different for every person. A diet and nutrition specialist (registered dietitian) can help you make a meal plan and calculate how many carbohydrates you should have at each meal and snack. Carbohydrates are found in the following foods:  Grains, such as breads and cereals.  Dried beans and soy products.  Starchy vegetables, such as potatoes, peas, and corn.  Fruit and fruit juices.  Milk and yogurt.  Sweets and snack foods, such as cake, cookies, candy, chips, and soft drinks. How do I count carbohydrates? There are two ways to count carbohydrates in food. You can use either of the methods or a combination of both. Reading "Nutrition Facts" on packaged food  The "Nutrition Facts" list is included on the labels of almost all packaged foods and beverages in the [place] It includes:  The serving size.  Information about nutrients in each serving, including the grams (g) of carbohydrate per serving. To use the "Nutrition Facts":  Decide how many servings you will have.  Multiply the number of servings by the number of carbohydrates per serving.  The resulting number is the total amount of carbohydrates that you will be having. Learning standard serving sizes of other foods  When you eat foods containing carbohydrates that are not packaged or do not include "Nutrition Facts" on the label, you need to measure the servings in order to count the amount of carbohydrates:  Measure the foods that you will eat with a food scale or measuring  cup, if needed.  Decide how many standard-size servings you will eat.  Multiply the number of servings by 15. Most carbohydrate-rich foods have about 15 g of carbohydrates per serving.  For example, if you eat 8 oz (170 g) of strawberries, you will have eaten 2 servings and 30 g of carbohydrates (2 servings x 15 g = 30 g).  For foods that have more than one food mixed, such as soups and casseroles, you must count the carbohydrates in each food that is included. The following list contains standard serving sizes of common carbohydrate-rich foods. Each of these servings has about 15 g of carbohydrates:   hamburger bun or  English muffin.   oz (15 mL) syrup.   oz (14 g) jelly.  1 slice of bread.  1 six-inch tortilla.  3 oz (85 g) cooked rice or pasta.  4 oz (113 g) cooked dried beans.  4 oz (113 g) starchy vegetable, such as peas, corn, or potatoes.  4 oz (113 g) hot cereal.  4 oz (113 g) mashed potatoes or  of a large baked potato.  4 oz (113 g) canned or frozen fruit.  4 oz (120 mL) fruit juice.  4-6 crackers.  6 chicken nuggets.  6 oz (170 g) unsweetened dry cereal.  6 oz (170 g) plain fat-free yogurt or yogurt sweetened with artificial sweeteners.  8 oz (240 mL) milk.  8 oz (170 g) fresh fruit or one small piece of fruit.  24 oz (680 g) popped popcorn. Example of carbohydrate counting Sample meal  3 oz (85 g) chicken breast.  6 oz (  170 g) brown rice.  4 oz (113 g) corn.  8 oz (240 mL) milk.  8 oz (170 g) strawberries with sugar-free whipped topping. Carbohydrate calculation 1. Identify the foods that contain carbohydrates:  Rice.  Corn.  Milk.  Strawberries. 2. Calculate how many servings you have of each food:  2 servings rice.  1 serving corn.  1 serving milk.  1 serving strawberries. 3. Multiply each number of servings by 15 g:  2 servings rice x 15 g = 30 g.  1 serving corn x 15 g = 15 g.  1 serving milk x 15 g = 15  g.  1 serving strawberries x 15 g = 15 g. 4. Add together all of the amounts to find the total grams of carbohydrates eaten:  30 g + 15 g + 15 g + 15 g = 75 g of carbohydrates total. This information is not intended to replace advice given to you by your health care provider. Make sure you discuss any questions you have with your health care provider. Document Released: 07/15/2005 Document Revised: 02/02/2016 Document Reviewed: 12/27/2015 Elsevier Interactive Patient Education  2017 Elsevier Inc.  

## 2016-08-07 NOTE — Assessment & Plan Note (Signed)
Encouraged DASH diet, decrease po intake and increase exercise as tolerated. Needs 7-8 hours of sleep nightly. Avoid trans fats, eat small, frequent meals every 4-5 hours with lean proteins, complex carbs and healthy fats. Minimize simple carbs 

## 2016-08-08 DIAGNOSIS — E109 Type 1 diabetes mellitus without complications: Secondary | ICD-10-CM | POA: Diagnosis not present

## 2016-08-08 DIAGNOSIS — C186 Malignant neoplasm of descending colon: Secondary | ICD-10-CM | POA: Diagnosis not present

## 2016-08-09 ENCOUNTER — Encounter: Payer: Self-pay | Admitting: Internal Medicine

## 2016-08-15 ENCOUNTER — Other Ambulatory Visit: Payer: Self-pay | Admitting: Family Medicine

## 2016-08-19 ENCOUNTER — Other Ambulatory Visit: Payer: Self-pay | Admitting: Family Medicine

## 2016-08-20 ENCOUNTER — Other Ambulatory Visit: Payer: Self-pay | Admitting: Endocrinology

## 2016-08-21 ENCOUNTER — Encounter: Payer: Self-pay | Admitting: Internal Medicine

## 2016-08-22 ENCOUNTER — Other Ambulatory Visit: Payer: Self-pay | Admitting: Family Medicine

## 2016-08-22 ENCOUNTER — Telehealth: Payer: Self-pay | Admitting: Family Medicine

## 2016-08-22 MED ORDER — GLUCOSE BLOOD VI STRP: ORAL_STRIP | 3 refills | Status: DC

## 2016-08-22 NOTE — Telephone Encounter (Signed)
Sent in to pharmacy.  

## 2016-08-22 NOTE — Telephone Encounter (Signed)
Patient's pharmacy called stating that the prescription for [facility] VERIO test strip  Will be going through patients medicare part B insurance. The pharmacy states that there needs to be a diagnosis code associated with the prescription, she states that an entirely new prescription must be sent over with the diagnosis code attached. Please advise  Pharmacy phone: [phone]

## 2016-08-26 ENCOUNTER — Other Ambulatory Visit: Payer: Self-pay

## 2016-08-26 MED ORDER — GLUCOSE BLOOD VI STRP: ORAL_STRIP | 3 refills | Status: DC

## 2016-08-30 ENCOUNTER — Other Ambulatory Visit: Payer: Self-pay

## 2016-08-30 DIAGNOSIS — E083299 Diabetes mellitus due to underlying condition with mild nonproliferative diabetic retinopathy without macular edema, unspecified eye: Secondary | ICD-10-CM

## 2016-08-30 MED ORDER — GLUCOSE BLOOD VI STRP: ORAL_STRIP | 3 refills | Status: DC

## 2016-09-02 ENCOUNTER — Telehealth: Payer: Self-pay | Admitting: Family Medicine

## 2016-09-02 NOTE — Telephone Encounter (Signed)
Ok not a problem

## 2016-09-02 NOTE — Telephone Encounter (Signed)
Caller name: Lonell Face from [place]  Call back number: [phone]  Pharmacy:  [id number] - [place], [place] [phone] (Phone) [phone] (Fax)     Reason for call:  ICD code was unable to be identified, requesting Rx refax to [phone]

## 2016-09-02 NOTE — Telephone Encounter (Signed)
Done already

## 2016-09-02 NOTE — Telephone Encounter (Signed)
Caller name: Relationship to patient:Self Can be reached: [phone]  Pharmacy:  Reason for call: Patient called to ask that request for Test Strips refill be cancelled because she is going to use her old meter and strips.

## 2016-09-18 ENCOUNTER — Other Ambulatory Visit: Payer: Self-pay | Admitting: Family Medicine

## 2016-09-19 ENCOUNTER — Other Ambulatory Visit: Payer: Self-pay | Admitting: Family Medicine

## 2016-09-19 MED ORDER — INSULIN LISPRO 100 UNIT/ML (KWIKPEN): PEN_INJECTOR | SUBCUTANEOUS | 1 refills | Status: DC

## 2016-09-19 MED ORDER — INSULIN GLARGINE 300 UNIT/ML ~~LOC~~ SOPN: 22.0000 [IU] | PEN_INJECTOR | Freq: Every day | SUBCUTANEOUS | 3 refills | Status: DC

## 2016-09-27 ENCOUNTER — Other Ambulatory Visit: Payer: Self-pay | Admitting: Family Medicine

## 2016-09-27 MED ORDER — GABAPENTIN 300 MG PO CAPS: 600.0000 mg | ORAL_CAPSULE | Freq: Three times a day (TID) | ORAL | 2 refills | Status: DC

## 2016-09-27 MED ORDER — HYDROXYZINE HCL 25 MG PO TABS: 25.0000 mg | ORAL_TABLET | Freq: Two times a day (BID) | ORAL | 0 refills | Status: DC

## 2016-10-02 ENCOUNTER — Ambulatory Visit (AMBULATORY_SURGERY_CENTER): Payer: Self-pay

## 2016-10-02 VITALS — Ht 62.0 in | Wt 182.2 lb

## 2016-10-02 DIAGNOSIS — Z85038 Personal history of other malignant neoplasm of large intestine: Secondary | ICD-10-CM

## 2016-10-02 NOTE — Progress Notes (Signed)
No diet meds No home oxygen No past problems with anesthesia No allergies to eggs or soy  Declined emmi 

## 2016-10-03 ENCOUNTER — Other Ambulatory Visit: Payer: MEDICARE

## 2016-10-03 ENCOUNTER — Ambulatory Visit: Payer: MEDICARE | Admitting: Family

## 2016-10-16 ENCOUNTER — Ambulatory Visit (AMBULATORY_SURGERY_CENTER): Payer: MEDICARE | Admitting: Internal Medicine

## 2016-10-16 ENCOUNTER — Encounter: Payer: Self-pay | Admitting: Internal Medicine

## 2016-10-16 VITALS — BP 134/74 | HR 72 | Temp 95.9°F | Resp 15 | Ht 62.0 in | Wt 182.0 lb

## 2016-10-16 DIAGNOSIS — D123 Benign neoplasm of transverse colon: Secondary | ICD-10-CM

## 2016-10-16 DIAGNOSIS — D120 Benign neoplasm of cecum: Secondary | ICD-10-CM | POA: Diagnosis not present

## 2016-10-16 DIAGNOSIS — E119 Type 2 diabetes mellitus without complications: Secondary | ICD-10-CM | POA: Diagnosis not present

## 2016-10-16 DIAGNOSIS — Z85038 Personal history of other malignant neoplasm of large intestine: Secondary | ICD-10-CM

## 2016-10-16 DIAGNOSIS — Z86010 Personal history of colonic polyps: Secondary | ICD-10-CM

## 2016-10-16 DIAGNOSIS — D122 Benign neoplasm of ascending colon: Secondary | ICD-10-CM

## 2016-10-16 MED ORDER — SODIUM CHLORIDE 0.9 % IV SOLN: 500.0000 mL | INTRAVENOUS | Status: DC

## 2016-10-16 NOTE — Progress Notes (Signed)
Report to PACU, RN, vss, BBS= Clear.  

## 2016-10-16 NOTE — Patient Instructions (Addendum)
I found and removed 9 tiny polyps - all look benign. I will let you know pathology results and when to have another routine colonoscopy by mail.  You also have a condition called diverticulosis - common and not usually a problem. Please read the handout provided.  I appreciate the opportunity to care for you. Gatha Mayer, MD, FACG YOU HAD AN ENDOSCOPIC PROCEDURE TODAY AT [facility]:   Refer to the procedure report that was given to you for any specific questions about what was found during the examination.  If the procedure report does not answer your questions, please call your gastroenterologist to clarify.  If you requested that your care partner not be given the details of your procedure findings, then the procedure report has been included in a sealed envelope for you to review at your convenience later.  YOU SHOULD EXPECT: Some feelings of bloating in the abdomen. Passage of more gas than usual.  Walking can help get rid of the air that was put into your GI tract during the procedure and reduce the bloating. If you had a lower endoscopy (such as a colonoscopy or flexible sigmoidoscopy) you may notice spotting of blood in your stool or on the toilet paper. If you underwent a bowel prep for your procedure, you may not have a normal bowel movement for a few days.  Please Note:  You might notice some irritation and congestion in your nose or some drainage.  This is from the oxygen used during your procedure.  There is no need for concern and it should clear up in a day or so.  SYMPTOMS TO REPORT IMMEDIATELY:   Following lower endoscopy (colonoscopy or flexible sigmoidoscopy):  Excessive amounts of blood in the stool  Significant tenderness or worsening of abdominal pains  Swelling of the abdomen that is new, acute  Fever of 100F or higher   For urgent or emergent issues, a gastroenterologist can be reached at any hour by calling [phone].   DIET:  We  do recommend a small meal at first, but then you may proceed to your regular diet.  Drink plenty of fluids but you should avoid alcoholic beverages for 24 hours.  ACTIVITY:  You should plan to take it easy for the rest of today and you should NOT DRIVE or use heavy machinery until tomorrow (because of the sedation medicines used during the test).    FOLLOW UP: Our staff will call the number listed on your records the next business day following your procedure to check on you and address any questions or concerns that you may have regarding the information given to you following your procedure. If we do not reach you, we will leave a message.  However, if you are feeling well and you are not experiencing any problems, there is no need to return our call.  We will assume that you have returned to your regular daily activities without incident.  If any biopsies were taken you will be contacted by phone or by letter within the next 1-3 weeks.  Please call us at [phone] if you have not heard about the biopsies in 3 weeks.   Await for biopsy results Polyps (handout given) Diverticulosis (handout given) Hemorrhoids (handout given)   SIGNATURES/CONFIDENTIALITY: You and/or your care partner have signed paperwork which will be entered into your electronic medical record.  These signatures attest to the fact that that the information above on your After Visit Summary has been  reviewed and is understood.  Full responsibility of the confidentiality of this discharge information lies with you and/or your care-partner. 

## 2016-10-16 NOTE — Progress Notes (Signed)
Pt's states no medical or surgical changes since previsit or office visit. 

## 2016-10-16 NOTE — Progress Notes (Signed)
Called to room to assist during endoscopic procedure.  Patient ID and intended procedure confirmed with present staff. Received instructions for my participation in the procedure from the performing physician.  

## 2016-10-16 NOTE — Op Note (Signed)
[facility] Patient Name: Beth Maldonado Procedure Date: 10/16/2016 9:32 AM MRN: [id number] Endoscopist: Gatha Mayer , MD Age: 71 Referring MD:  Date of Birth: 22-Mar-1946 Gender: Female Account #: [id number] Procedure:                Colonoscopy Indications:              High risk colon cancer surveillance: Personal                            history of colon cancer Medicines:                Propofol per Anesthesia, Monitored Anesthesia Care Procedure:                Pre-Anesthesia Assessment:                           - Prior to the procedure, a History and Physical                            was performed, and patient medications and                            allergies were reviewed. [name] tolerance of                            previous anesthesia was also reviewed. The risks                            and benefits of the procedure and the sedation                            options and risks were discussed with the patient.                            All questions were answered, and informed consent                            was obtained. Prior Anticoagulants: The patient                            last took aspirin 1 day prior to the procedure. ASA                            Grade Assessment: II - A patient with mild systemic                            disease. After reviewing the risks and benefits,                            the patient was deemed in satisfactory condition to                            undergo the procedure.  After obtaining informed consent, the colonoscope                            was passed under direct vision. Throughout the                            procedure, the patient's blood pressure, pulse, and                            oxygen saturations were monitored continuously. The                            Colonoscope was introduced through the anus and                            advanced to the the cecum,  identified by                            appendiceal orifice and ileocecal valve. The                            colonoscopy was performed without difficulty. The                            patient tolerated the procedure well. The quality                            of the bowel preparation was adequate. The                            ileocecal valve, appendiceal orifice, and rectum                            were photographed. The bowel preparation used was                            Miralax. Scope In: 9:39:32 AM Scope Out: 10:06:31 AM Scope Withdrawal Time: 0 hours 21 minutes 33 seconds  Total Procedure Duration: 0 hours 26 minutes 59 seconds  Findings:                 The perianal and digital rectal examinations were                            normal.                           Eight sessile polyps were found in the transverse                            colon, ascending colon and cecum. The polyps were                            diminutive in size. These polyps were removed with  a cold snare. Resection and retrieval were                            complete. Verification of patient identification                            for the specimen was done. Estimated blood loss was                            minimal.                           A diminutive polyp was found in the cecum. The                            polyp was sessile. The polyp was removed with a                            cold biopsy forceps. Resection and retrieval were                            complete. Verification of patient identification                            for the specimen was done. Estimated blood loss was                            minimal.                           Multiple small and large-mouthed diverticula were                            found in the entire colon. There was no evidence of                            diverticular bleeding.                           Internal  hemorrhoids were found during retroflexion.                           The exam was otherwise without abnormality on                            direct and retroflexion views. Complications:            No immediate complications. Estimated Blood Loss:     Estimated blood loss was minimal. Impression:               - Eight diminutive polyps in the transverse colon,                            in the ascending colon and in the cecum, removed  with a cold snare. Resected and retrieved.                           - One diminutive polyp in the cecum, removed with a                            cold biopsy forceps. Resected and retrieved.                           - Moderate diverticulosis in the entire examined                            colon. There was no evidence of diverticular                            bleeding.                           - Internal hemorrhoids.                           - The examination was otherwise normal on direct                            and retroflexion views.                           - Personal history of colonic polyps - 2 adenomas                            and cancer 2017 Recommendation:           - Patient has a contact number available for                            emergencies. The signs and symptoms of potential                            delayed complications were discussed with the                            patient. Return to normal activities tomorrow.                            Written discharge instructions were provided to the                            patient.                           - Resume previous diet.                           - Continue present medications.                           - Repeat colonoscopy is  recommended for                            surveillance. The colonoscopy date will be                            determined after pathology results from today's                            exam become available for  review.                           - Extra or different prep next time Gatha Mayer, MD 10/16/2016 10:24:54 AM This report has been signed electronically.

## 2016-10-17 ENCOUNTER — Telehealth: Payer: Self-pay | Admitting: *Deleted

## 2016-10-17 NOTE — Telephone Encounter (Signed)
  Follow up Call-  Call back number 10/16/2016 10/27/2015  Post procedure Call Back phone  # [phone]  Permission to leave phone message Yes Yes  Some recent data might be hidden     Patient questions:  Do you have a fever, pain , or abdominal swelling? No. Pain Score  0 *  Have you tolerated food without any problems? Yes.    Have you been able to return to your normal activities? Yes.    Do you have any questions about your discharge instructions: Diet   No. Medications  No. Follow up visit  No.  Do you have questions or concerns about your Care? No.  Actions: * If pain score is 4 or above: No action needed, pain <4.

## 2016-10-18 ENCOUNTER — Telehealth: Payer: Self-pay | Admitting: Hematology & Oncology

## 2016-10-18 NOTE — Telephone Encounter (Signed)
Patient called and resch 10/03/16 cx apt for 10/30/16

## 2016-10-22 ENCOUNTER — Encounter: Payer: Self-pay | Admitting: Internal Medicine

## 2016-10-22 DIAGNOSIS — Z86010 Personal history of colonic polyps: Secondary | ICD-10-CM

## 2016-10-22 NOTE — Progress Notes (Signed)
Having problems generating correct header on the letter and sending so please mail

## 2016-10-22 NOTE — Progress Notes (Signed)
7 adenomas at least Recall colon 1 year 2019 My Chart letter to patient

## 2016-10-28 ENCOUNTER — Other Ambulatory Visit: Payer: Self-pay | Admitting: Family Medicine

## 2016-10-28 ENCOUNTER — Telehealth: Payer: Self-pay | Admitting: Internal Medicine

## 2016-10-28 NOTE — Telephone Encounter (Signed)
Left message for patient to call back  

## 2016-10-28 NOTE — Telephone Encounter (Signed)
I reviewed the letter with her and all questions were answered.  She will call back for any additional questions or concerns.

## 2016-10-30 ENCOUNTER — Ambulatory Visit (HOSPITAL_BASED_OUTPATIENT_CLINIC_OR_DEPARTMENT_OTHER): Payer: MEDICARE | Admitting: Family

## 2016-10-30 ENCOUNTER — Other Ambulatory Visit (HOSPITAL_BASED_OUTPATIENT_CLINIC_OR_DEPARTMENT_OTHER): Payer: MEDICARE

## 2016-10-30 VITALS — BP 135/66 | HR 78 | Temp 98.8°F | Resp 18 | Wt 185.8 lb

## 2016-10-30 DIAGNOSIS — D508 Other iron deficiency anemias: Secondary | ICD-10-CM

## 2016-10-30 DIAGNOSIS — D509 Iron deficiency anemia, unspecified: Secondary | ICD-10-CM

## 2016-10-30 DIAGNOSIS — D473 Essential (hemorrhagic) thrombocythemia: Secondary | ICD-10-CM

## 2016-10-30 DIAGNOSIS — C186 Malignant neoplasm of descending colon: Secondary | ICD-10-CM

## 2016-10-30 DIAGNOSIS — C187 Malignant neoplasm of sigmoid colon: Secondary | ICD-10-CM | POA: Diagnosis not present

## 2016-10-30 DIAGNOSIS — D500 Iron deficiency anemia secondary to blood loss (chronic): Secondary | ICD-10-CM

## 2016-10-30 DIAGNOSIS — E083299 Diabetes mellitus due to underlying condition with mild nonproliferative diabetic retinopathy without macular edema, unspecified eye: Secondary | ICD-10-CM

## 2016-10-30 LAB — CBC WITH DIFFERENTIAL (CANCER CENTER ONLY)
BASO#: 0.1 10*3/uL (ref 0.0–0.2)
BASO%: 0.4 % (ref 0.0–2.0)
EOS ABS: 1.4 10*3/uL — AB (ref 0.0–0.5)
EOS%: 11.5 % — ABNORMAL HIGH (ref 0.0–7.0)
HCT: 35.9 % (ref 34.8–46.6)
HGB: 11.5 g/dL — ABNORMAL LOW (ref 11.6–15.9)
LYMPH#: 3.2 10*3/uL (ref 0.9–3.3)
LYMPH%: 26.2 % (ref 14.0–48.0)
MCH: 29.1 pg (ref 26.0–34.0)
MCHC: 32 g/dL (ref 32.0–36.0)
MCV: 91 fL (ref 81–101)
MONO#: 0.8 10*3/uL (ref 0.1–0.9)
MONO%: 6.6 % (ref 0.0–13.0)
NEUT#: 6.7 10*3/uL — ABNORMAL HIGH (ref 1.5–6.5)
NEUT%: 55.3 % (ref 39.6–80.0)
PLATELETS: 593 10*3/uL — AB (ref 145–400)
RBC: 3.95 10*6/uL (ref 3.70–5.32)
RDW: 14.4 % (ref 11.1–15.7)
WBC: 12 10*3/uL — ABNORMAL HIGH (ref 3.9–10.0)

## 2016-10-30 LAB — FERRITIN: FERRITIN: 21 ng/mL (ref 9–269)

## 2016-10-30 LAB — CHCC SATELLITE - SMEAR

## 2016-10-30 LAB — COMPREHENSIVE METABOLIC PANEL
ALT: 15 U/L (ref 0–55)
ANION GAP: 11 meq/L (ref 3–11)
AST: 16 U/L (ref 5–34)
Albumin: 4.1 g/dL (ref 3.5–5.0)
Alkaline Phosphatase: 81 U/L (ref 40–150)
BUN: 22.3 mg/dL (ref 7.0–26.0)
CHLORIDE: 103 meq/L (ref 98–109)
CO2: 28 meq/L (ref 22–29)
Calcium: 10.2 mg/dL (ref 8.4–10.4)
Creatinine: 1.4 mg/dL — ABNORMAL HIGH (ref 0.6–1.1)
EGFR: 44 mL/min/{1.73_m2} — ABNORMAL LOW (ref >90)
Glucose: 94 mg/dl (ref 70–140)
Potassium: 3.8 mEq/L (ref 3.5–5.1)
Sodium: 142 mEq/L (ref 136–145)
Total Bilirubin: 0.71 mg/dL (ref 0.20–1.20)
Total Protein: 7.8 g/dL (ref 6.4–8.3)

## 2016-10-30 LAB — IRON AND TIBC
%SAT: 22 % (ref 21–57)
IRON: 70 ug/dL (ref 41–142)
TIBC: 324 ug/dL (ref 236–444)
UIBC: 254 ug/dL (ref 120–384)

## 2016-10-30 NOTE — Progress Notes (Addendum)
Hematology and Oncology Follow Up Visit  Beth Maldonado [id number] [birth year] 71 y.o. 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 She is doing well but under a bit of stress at home and has fatigue at times. Unfortunately, her husband has end stage liver cancer. She has a good support system with family that will come to help if she needs them.  She continues to take an oral iron supplement daily. Platelet count is elevated at 593. Iron studies are pending. Hgb is stable at 11.5 with an MCV of 91.  She had a colonoscopy on March 21st. 9 polyps were removed and pathology was negative for malignancy. Multiple small and wide mouth diverticula were noted throughout the colon with no evidence of bleed. Internal hemorrhoids were also noted.  She denies amy episodes of bleeding, bruising or petechiae. No lymphadenopathy found one exam.  No fever, chills, n/v, cough, rash, dizziness, chest pain, palpitations, abdominal pain or changes in bowel or bladder habits.  She has occasional SOB with over exertion and takes breaks to rest as needed. She states that this is "due to being overweight." No swelling or tenderness in her extremities. Neuropathy in her hands and feet is unchanged.  She has maintained a good appetite and states that she is staying well hydrated. Her weight is stable.  Hgb A1c in January was 7. She states that her blood sugars have been fairly well controlled.  She is staying active and exercises 30-45 minutes each day.   ECOG Performance Status: 1 - Symptomatic but completely ambulatory   Medications:  Allergies as of 10/30/2016      Reactions   Penicillins Swelling, Other (See Comments)   tongue swelled per pt Has patient had a PCN reaction causing immediate rash,  facial/tongue/throat swelling, SOB or lightheadedness with hypotension: yes Has patient had a PCN reaction causing severe rash involving mucus membranes or skin necrosis: no Has patient had a PCN reaction that required hospitalization no  Has patient had a PCN reaction occurring within the last 10 years: unsure If all of the above answers are "NO", then may proceed with Cephalosporin use.      Medication List       Accurate as of 10/30/16  8:48 AM. Always use your most recent med list.          aspirin EC 81 MG tablet Take 81 mg by mouth daily.   atorvastatin 10 MG tablet Commonly known as:  LIPITOR TAKE 1 TABLET (10 MG TOTAL) BY MOUTH DAILY.   cyclobenzaprine 5 MG tablet Commonly known as:  FLEXERIL TAKE 1 TABLET BY MOUTH ATBEDTIME AS NEEDED FOR MUSCLE SPASMS.   escitalopram 20 MG tablet Commonly known as:  LEXAPRO TAKE 1 TABLET BY MOUTH  DAILY   gabapentin 300 MG capsule Commonly known as:  NEURONTIN Take 2 capsules (600 mg total) by mouth 3 (three) times daily.   glucose blood test strip Commonly known as:  ONETOUCH VERIO Use four times daily to check blood sugar.  DX  E11.9   hydrochlorothiazide 25 MG tablet Commonly known as:  HYDRODIURIL TAKE 1 TABLET BY MOUTH ONCE A DAY   hydrOXYzine 25 MG tablet Commonly known as:  ATARAX/VISTARIL TAKE 1 TABLET BY MOUTH TWICE A DAY   hydrOXYzine 25 MG  tablet Commonly known as:  ATARAX/VISTARIL TAKE ONE TABLET BY MOUTH TWICE DAILY   Insulin Glargine 300 UNIT/ML Sopn Commonly known as:  TOUJEO SOLOSTAR Inject 22 Units into the skin daily.   insulin lispro 100 UNIT/ML KiwkPen Commonly known as:  HUMALOG KWIKPEN Inject Five units subcutaneously with breakfast and dinner and 7 units at lunch   Insulin Pen Needle 32G X 4 MM Misc Use for insulin injection twice a day 250.00   lidocaine 5 % Commonly known as:  LIDODERM Place 1 patch onto the skin daily. Remove & Discard patch within 12 hours or as directed by MD     metFORMIN 500 MG tablet Commonly known as:  GLUCOPHAGE   multivitamin tablet Take 1 tablet by mouth daily.   ONETOUCH DELICA LANCETS 99B Misc Use as directed to check blood sugar 4 times per day dx code E11.9   triamterene-hydrochlorothiazide 37.5-25 MG tablet Commonly known as:  MAXZIDE-25 TAKE 1 TABLET BY MOUTH DAILY.       Allergies:  Allergies  Allergen Reactions  . Penicillins Swelling and Other (See Comments)    tongue swelled per pt Has patient had a PCN reaction causing immediate rash, facial/tongue/throat swelling, SOB or lightheadedness with hypotension: yes Has patient had a PCN reaction causing severe rash involving mucus membranes or skin necrosis: no Has patient had a PCN reaction that required hospitalization no  Has patient had a PCN reaction occurring within the last 10 years: unsure If all of the above answers are "NO", then may proceed with Cephalosporin use.     Past Medical History, Surgical history, Social history, and Family History were reviewed and updated.  Review of Systems: All other 10 point review of systems is negative.   Physical Exam:  weight is 185 lb 12.8 oz (84.3 kg). Her oral temperature is 98.8 F (37.1 C). Her blood pressure is 135/66 and her pulse is 78. Her respiration is 18 and oxygen saturation is 97%.   Wt Readings from Last 3 Encounters:  10/30/16 185 lb 12.8 oz (84.3 kg)  10/16/16 182 lb (82.6 kg)  10/02/16 182 lb 3.2 oz (82.6 kg)    Ocular: Sclerae unicteric, pupils equal, round and reactive to light Ear-nose-throat: Oropharynx clear, dentition fair Lymphatic: No cervical, supraclavicular or axillary adenopathy Lungs no rales or rhonchi, good excursion bilaterally Heart regular rate and rhythm, no murmur appreciated Abd soft, nontender, positive bowel sounds, no liver or spleen tip palpated on exam, no fluid wave MSK no focal spinal tenderness, no joint edema Neuro: non-focal, well-oriented, appropriate  affect Breasts: Deferred  Lab Results  Component Value Date   WBC 12.0 (H) 10/30/2016   HGB 11.5 (L) 10/30/2016   HCT 35.9 10/30/2016   MCV 91 10/30/2016   PLT 593 (H) 10/30/2016   Lab Results  Component Value Date   FERRITIN 25 04/03/2016   IRON 62 04/03/2016   TIBC 335 04/03/2016   UIBC 274 04/03/2016   IRONPCTSAT 18 (L) 04/03/2016   Lab Results  Component Value Date   RETICCTPCT 1.7 05/04/2014   RBC 3.95 10/30/2016   RETICCTABS 73.8 05/04/2014   No results found for: KPAFRELGTCHN, LAMBDASER, KAPLAMBRATIO No results found for: IGGSERUM, IGA, IGMSERUM No results found for: Ronnald Ramp, A1GS, A2GS, Violet Baldy, MSPIKE, SPEI   Chemistry      Component Value Date/Time   NA 143 08/01/2016 0746   NA 137 04/03/2016 0909   K 4.2 08/01/2016 0746   K 3.5 04/03/2016 0909   CL 102  08/01/2016 0746   CL 103 04/03/2016 0909   CO2 30 08/01/2016 0746   CO2 29 04/03/2016 0909   BUN 25 (H) 08/01/2016 0746   BUN 20 04/03/2016 0909   CREATININE 1.29 (H) 08/01/2016 0746   CREATININE 1.6 (H) 04/03/2016 0909      Component Value Date/Time   CALCIUM 10.1 08/01/2016 0746   CALCIUM 9.0 04/03/2016 0909   ALKPHOS 66 08/01/2016 0746   ALKPHOS 68 04/03/2016 0909   AST 14 08/01/2016 0746   AST 21 04/03/2016 0909   ALT 16 08/01/2016 0746   ALT 22 04/03/2016 0909   BILITOT 0.6 08/01/2016 0746   BILITOT 1.00 04/03/2016 0909     Impression and Plan: Beth Maldonado is a 71 yo African American female with history of stage II adenocarcinoma of the sigmoid colon, 18 lymph nodes were negative. She is MMR-P. She had her routine colonoscopy last month with 9 polyps removed which were all benign. She has had no episodes of bleeding and no changes in her bowel habits. She has occasional fatigue. She also has history of iron deficiency anemia and secondary thrombocytosis. Iron studies today are pending. She continues to take her oral iron supplement daily.  We will go ahead and plan  to see her back in 6 months for repeat lab work and follow-up.  I spent about 25 minutes with the patient, all questions were answered.  She will contact our office with any questions or concerns. We can certainly see her sooner if need be.     Eliezer Bottom, NP 4/4/20188:48 AM

## 2016-11-01 ENCOUNTER — Telehealth: Payer: Self-pay | Admitting: *Deleted

## 2016-11-01 NOTE — Telephone Encounter (Signed)
Lm for patient to call  Me.

## 2016-11-01 NOTE — Telephone Encounter (Signed)
-----   Message from Eliezer Bottom, NP sent at 11/01/2016 10:30 AM EDT ----- Regarding: Iron  Iron studies are stable! Thank you!  Sarah  ----- Message ----- From: Volanda Napoleon, MD Sent: 10/30/2016   1:37 PM To: Eliezer Bottom, NP    ----- Message ----- From: Interface, Lab In Three Zero One Sent: 10/30/2016   8:42 AM To: Volanda Napoleon, MD

## 2016-11-14 ENCOUNTER — Other Ambulatory Visit: Payer: Self-pay | Admitting: Family Medicine

## 2016-11-26 ENCOUNTER — Other Ambulatory Visit: Payer: Self-pay | Admitting: Family Medicine

## 2016-11-28 ENCOUNTER — Other Ambulatory Visit (INDEPENDENT_AMBULATORY_CARE_PROVIDER_SITE_OTHER): Payer: MEDICARE

## 2016-11-28 ENCOUNTER — Other Ambulatory Visit: Payer: Self-pay | Admitting: Family Medicine

## 2016-11-28 DIAGNOSIS — I10 Essential (primary) hypertension: Secondary | ICD-10-CM

## 2016-11-28 DIAGNOSIS — E083299 Diabetes mellitus due to underlying condition with mild nonproliferative diabetic retinopathy without macular edema, unspecified eye: Secondary | ICD-10-CM

## 2016-11-28 DIAGNOSIS — E782 Mixed hyperlipidemia: Secondary | ICD-10-CM

## 2016-11-28 LAB — COMPREHENSIVE METABOLIC PANEL
ALBUMIN: 4.4 g/dL (ref 3.5–5.2)
ALT: 15 U/L (ref 0–35)
AST: 16 U/L (ref 0–37)
Alkaline Phosphatase: 61 U/L (ref 39–117)
BUN: 22 mg/dL (ref 6–23)
CALCIUM: 9.9 mg/dL (ref 8.4–10.5)
CHLORIDE: 103 meq/L (ref 96–112)
CO2: 27 mEq/L (ref 19–32)
CREATININE: 1.42 mg/dL — AB (ref 0.40–1.20)
GFR: 46.91 mL/min — AB (ref >60.00)
Glucose, Bld: 137 mg/dL — ABNORMAL HIGH (ref 70–99)
POTASSIUM: 3.5 meq/L (ref 3.5–5.1)
Sodium: 141 mEq/L (ref 135–145)
Total Bilirubin: 0.8 mg/dL (ref 0.2–1.2)
Total Protein: 7.5 g/dL (ref 6.0–8.3)

## 2016-11-28 LAB — CBC
HEMATOCRIT: 36 % (ref 36.0–46.0)
HEMOGLOBIN: 11.9 g/dL — AB (ref 12.0–15.0)
MCHC: 33.1 g/dL (ref 30.0–36.0)
MCV: 88.1 fl (ref 78.0–100.0)
PLATELETS: 584 10*3/uL — AB (ref 150.0–400.0)
RBC: 4.08 Mil/uL (ref 3.87–5.11)
RDW: 14.7 % (ref 11.5–15.5)
WBC: 9.2 10*3/uL (ref 4.0–10.5)

## 2016-11-28 LAB — LIPID PANEL
CHOLESTEROL: 150 mg/dL (ref 0–200)
HDL: 54.3 mg/dL (ref >39.00)
LDL Cholesterol: 63 mg/dL (ref 0–99)
NonHDL: 95.68
TRIGLYCERIDES: 161 mg/dL — AB (ref 0.0–149.0)
Total CHOL/HDL Ratio: 3
VLDL: 32.2 mg/dL (ref 0.0–40.0)

## 2016-11-28 LAB — HEMOGLOBIN A1C: HEMOGLOBIN A1C: 6.9 % — AB (ref 4.6–6.5)

## 2016-11-28 LAB — TSH: TSH: 2.27 u[IU]/mL (ref 0.35–4.50)

## 2016-12-05 ENCOUNTER — Ambulatory Visit (HOSPITAL_BASED_OUTPATIENT_CLINIC_OR_DEPARTMENT_OTHER)
Admission: RE | Admit: 2016-12-05 | Discharge: 2016-12-05 | Disposition: A | Discharge status: Home / Self Care | Payer: MEDICARE | Source: Ambulatory Visit | Attending: Family Medicine | Admitting: Family Medicine

## 2016-12-05 ENCOUNTER — Encounter: Payer: Self-pay | Admitting: Family Medicine

## 2016-12-05 ENCOUNTER — Other Ambulatory Visit: Payer: Self-pay | Admitting: Family Medicine

## 2016-12-05 ENCOUNTER — Ambulatory Visit (INDEPENDENT_AMBULATORY_CARE_PROVIDER_SITE_OTHER): Payer: MEDICARE | Admitting: Family Medicine

## 2016-12-05 VITALS — BP 127/61 | HR 81 | Temp 98.6°F | Wt 185.6 lb

## 2016-12-05 DIAGNOSIS — G8929 Other chronic pain: Secondary | ICD-10-CM

## 2016-12-05 DIAGNOSIS — E782 Mixed hyperlipidemia: Secondary | ICD-10-CM

## 2016-12-05 DIAGNOSIS — M5442 Lumbago with sciatica, left side: Principal | ICD-10-CM

## 2016-12-05 DIAGNOSIS — E083299 Diabetes mellitus due to underlying condition with mild nonproliferative diabetic retinopathy without macular edema, unspecified eye: Secondary | ICD-10-CM

## 2016-12-05 DIAGNOSIS — M4807 Spinal stenosis, lumbosacral region: Secondary | ICD-10-CM | POA: Diagnosis not present

## 2016-12-05 DIAGNOSIS — F418 Other specified anxiety disorders: Secondary | ICD-10-CM | POA: Diagnosis not present

## 2016-12-05 DIAGNOSIS — D508 Other iron deficiency anemias: Secondary | ICD-10-CM | POA: Diagnosis not present

## 2016-12-05 DIAGNOSIS — I10 Essential (primary) hypertension: Secondary | ICD-10-CM

## 2016-12-05 DIAGNOSIS — M545 Low back pain: Secondary | ICD-10-CM

## 2016-12-05 DIAGNOSIS — M47897 Other spondylosis, lumbosacral region: Secondary | ICD-10-CM | POA: Diagnosis not present

## 2016-12-05 DIAGNOSIS — M5441 Lumbago with sciatica, right side: Principal | ICD-10-CM

## 2016-12-05 DIAGNOSIS — E6609 Other obesity due to excess calories: Secondary | ICD-10-CM | POA: Diagnosis not present

## 2016-12-05 MED ORDER — TIZANIDINE HCL 4 MG PO TABS: 4.0000 mg | ORAL_TABLET | Freq: Three times a day (TID) | ORAL | Status: DC | PRN

## 2016-12-05 MED ORDER — ONETOUCH DELICA LANCETS 33G MISC: 3 refills | Status: DC

## 2016-12-05 NOTE — Patient Instructions (Signed)
Hospice candidate? vs Home Health Back Pain, Adult Back pain is very common. The pain often gets better over time. The cause of back pain is usually not dangerous. Most people can learn to manage their back pain on their own. Follow these instructions at home: Watch your back pain for any changes. The following actions may help to lessen any pain you are feeling:  Stay active. Start with short walks on flat ground if you can. Try to walk farther each day.  Exercise regularly as told by your doctor. Exercise helps your back heal faster. It also helps avoid future injury by keeping your muscles strong and flexible.  Do not sit, drive, or stand in one place for more than 30 minutes.  Do not stay in bed. Resting more than 1-2 days can slow down your recovery.  Be careful when you bend or lift an object. Use good form when lifting:  Bend at your knees.  Keep the object close to your body.  Do not twist.  Sleep on a firm mattress. Lie on your side, and bend your knees. If you lie on your back, put a pillow under your knees.  Take medicines only as told by your doctor.  Put ice on the injured area.  Put ice in a plastic bag.  Place a towel between your skin and the bag.  Leave the ice on for 20 minutes, 2-3 times a day for the first 2-3 days. After that, you can switch between ice and heat packs.  Avoid feeling anxious or stressed. Find good ways to deal with stress, such as exercise.  Maintain a healthy weight. Extra weight puts stress on your back. Contact a doctor if:  You have pain that does not go away with rest or medicine.  You have worsening pain that goes down into your legs or buttocks.  You have pain that does not get better in one week.  You have pain at night.  You lose weight.  You have a fever or chills. Get help right away if:  You cannot control when you poop (bowel movement) or pee (urinate).  Your arms or legs feel weak.  Your arms or legs lose  feeling (numbness).  You feel sick to your stomach (nauseous) or throw up (vomit).  You have belly (abdominal) pain.  You feel like you may pass out (faint). This information is not intended to replace advice given to you by your health care provider. Make sure you discuss any questions you have with your health care provider. Document Released: 01/01/2008 Document Revised: 12/21/2015 Document Reviewed: 11/16/2013 Elsevier Interactive Patient Education  2017 [employer].

## 2016-12-05 NOTE — Progress Notes (Signed)
Pre visit review using our clinic review tool, if applicable. No additional management support is needed unless otherwise documented below in the visit note. 

## 2016-12-05 NOTE — Progress Notes (Signed)
Patient ID: Beth Maldonado, female   DOB: 1946/02/27, 71 y.o.   MRN: [id number]   Subjective:  I acted as a Education administrator for Penni Homans, [place], [place]   Patient ID: Beth Maldonado, female    DOB: 28-Mar-1946, 71 y.o.   MRN: [id number]  Chief Complaint  Patient presents with  . Hypertension    74-monthF/U.  . Diabetes    Diabetes  She presents for her follow-up diabetic visit. She has type 2 diabetes mellitus. Her disease course has been fluctuating. Hypoglycemia symptoms include nervousness/anxiousness. Pertinent negatives for hypoglycemia include no headaches. Pertinent negatives for diabetes include no blurred vision and no chest pain. Risk factors for coronary artery disease include hypertension, obesity and diabetes mellitus. Current diabetic treatment includes insulin injections. She is compliant with treatment most of the time. Her home blood glucose trend is fluctuating dramatically. (States that glucose readings have been from 70-305 (mostly fasting))  Hypertension  This is a chronic problem. The problem is controlled. Associated symptoms include malaise/fatigue. Pertinent negatives include no blurred vision, chest pain, headaches, palpitations or shortness of breath. Risk factors for coronary artery disease include obesity and diabetes mellitus.    Patient is in today for a four month follow up. Patient states that she has been experiencing some stress. States that her blood sugars have been all over the place; ranging from 70-305 (mostly fasting). Wants to know if it is ok to start taking her Tuoj in the mornings instead of at night. Also reports that she had a Colonoscopy done in March 2018, where nine polyps were removed. Patient also states that she is still suffering from chronic back pain. Patient has a Hx of HTN, Diabetes mellitus, osteopenia, hyperlipidemia, insomnia, anemia. Patient has no additional acute concerns noted at this time. No recent fall or specific injury but she has  been having to lift her husband frequently. Notes pain in low back radiates to b/l posterior hips and into upper thighs some. No incontinence. Denies CP/palp/SOB/HA/congestion/fevers/GI or GU c/o. Taking meds as prescribed  Patient Care Team: BMosie Lukes MD as PCP - General (Family Medicine) EMarin OlpPRudell Cobb MD as Consulting Physician (Oncology) GGatha Mayer MD as Consulting Physician (Gastroenterology) DCalvert Cantor MD as Consulting Physician (Ophthalmology) NAlphonsa Overall MD as Consulting Physician (General Surgery)   Past Medical History:  Diagnosis Date  . Acute bronchitis 03/29/2013  . Acute upper respiratory infection 11/27/2014  . Allergy   . Anemia 07/02/2015  . Arthritis   . Back pain    upper and lower last 20 years  . Cancer of left colon ([place] 12/18/2015  . Depression with anxiety 06/27/2013  . Diabetes mellitus   . Hx of adenomatous colonic polyps 10/27/2015  . Hyperlipidemia   . Hypertension   . Insomnia 06/30/2015  . Medicare annual wellness visit, subsequent 03/23/2014  . Neck pain on left side 05/29/2014   hx of  . Neuromuscular disorder (HCC)    neuropathy feet  . Neuropathy   . Osteopenia   . Preventative health care 07/02/2015  . Tinnitus    RIGHT EAR ALL THE TIME    Past Surgical History:  Procedure Laterality Date  . BREAST ENHANCEMENT SURGERY Bilateral 1987   Reduction  . CATARACT EXTRACTION, BILATERAL  2011  . COLONOSCOPY  2017   colon cancer  . [place] KNEE ARTHROSCOPY Right 2003  . LAPAROSCOPIC SIGMOID COLECTOMY N/A 12/18/2015   Procedure: LAPAROSCOPIC SIGMOID COLECTOMY;  Surgeon: DAlphonsa Overall  MD;  Location: [facility];  Service: General;  Laterality: N/A;  . TUBAL LIGATION  1970  . VAGINAL HYSTERECTOMY  1974   partial    Family History  Problem Relation Age of Onset  . Diabetes Mother   . Heart attack Father   . Heart disease Father        18 and 43 MI  . Diabetes Sister   . Sickle cell anemia Sister   . Arthritis  Sister   . Hypertension Sister   . Diabetes Sister   . Thyroid disease Sister   . Arthritis Sister   . Glaucoma Sister   . Colon cancer Neg Hx     Social History   Social History  . Marital status: Married    Spouse name: N/A  . Number of children: 1  . Years of education: N/A   Occupational History  . retired    Social History Main Topics  . Smoking status: Former Smoker    Packs/day: 1.00    Years: 20.00    Types: Cigarettes    Start date: 10/03/1966    Quit date: 11/03/1986  . Smokeless tobacco: Never Used     Comment: quit 28 years ago  . Alcohol use 0.0 oz/week     Comment: Rare- Special Occasional   . Drug use: No  . Sexual activity: Not on file     Comment: lives with husband, no dietary restrictions   Other Topics Concern  . Not on file   Social History Narrative  . No narrative on file    Outpatient Medications Prior to Visit  Medication Sig Dispense Refill  . aspirin EC 81 MG tablet Take 81 mg by mouth daily.    Marland Kitchen atorvastatin (LIPITOR) 10 MG tablet TAKE 1 TABLET (10 MG TOTAL) BY MOUTH DAILY. 30 tablet 5  . cyclobenzaprine (FLEXERIL) 5 MG tablet TAKE 1 TABLET BY MOUTH ATBEDTIME AS NEEDED FOR MUSCLE SPASMS. 30 tablet 0  . escitalopram (LEXAPRO) 20 MG tablet TAKE 1 TABLET BY MOUTH  DAILY 90 tablet 0  . gabapentin (NEURONTIN) 300 MG capsule Take 2 capsules (600 mg total) by mouth 3 (three) times daily. 180 capsule 2  . glucose blood (ONETOUCH VERIO) test strip Use four times daily to check blood sugar.  DX  E11.9 200 each 3  . HUMALOG KWIKPEN 100 UNIT/ML KiwkPen INJECT FIVE UNITS  SUBCUTANEOUSLY WITH  BREAKFAST AND DINNER AND 7  UNITS AT LUNCH 30 mL 2  . hydrochlorothiazide (HYDRODIURIL) 25 MG tablet TAKE 1 TABLET BY MOUTH ONCE A DAY 30 tablet 6  . hydrOXYzine (ATARAX/VISTARIL) 25 MG tablet TAKE 1 TABLET BY MOUTH TWICE A DAY 60 tablet 0  . hydrOXYzine (ATARAX/VISTARIL) 25 MG tablet TAKE ONE TABLET BY MOUTH TWICE DAILY  60 tablet 0  . Insulin Glargine (TOUJEO  SOLOSTAR) 300 UNIT/ML SOPN Inject 22 Units into the skin daily. 3 pen 3  . Insulin Pen Needle 32G X 4 MM MISC Use for insulin injection twice a day 250.00 100 each 2  . lidocaine (LIDODERM) 5 % Place 1 patch onto the skin daily. Remove & Discard patch within 12 hours or as directed by MD (Patient taking differently: Place 1 patch onto the skin daily as needed (For pain.). Remove & Discard patch within 12 hours or as directed by MD) 30 patch 0  . metFORMIN (GLUCOPHAGE) 500 MG tablet     . Multiple Vitamin (MULTIVITAMIN) tablet Take 1 tablet by mouth daily.    Marland Kitchen triamterene-hydrochlorothiazide (  MAXZIDE-25) 37.5-25 MG tablet TAKE 1 TABLET BY MOUTH DAILY. 30 tablet 6  . ONETOUCH DELICA LANCETS 42V MISC Use as directed to check blood sugar 4 times per day dx code E11.9 200 each 3  . 0.9 %  sodium chloride infusion      No facility-administered medications prior to visit.     Allergies  Allergen Reactions  . Penicillins Swelling and Other (See Comments)    tongue swelled per pt Has patient had a PCN reaction causing immediate rash, facial/tongue/throat swelling, SOB or lightheadedness with hypotension: yes Has patient had a PCN reaction causing severe rash involving mucus membranes or skin necrosis: no Has patient had a PCN reaction that required hospitalization no  Has patient had a PCN reaction occurring within the last 10 years: unsure If all of the above answers are "NO", then may proceed with Cephalosporin use.     Review of Systems  Constitutional: Positive for malaise/fatigue. Negative for fever.  HENT: Negative for congestion.   Eyes: Negative for blurred vision.  Respiratory: Negative for cough and shortness of breath.   Cardiovascular: Negative for chest pain, palpitations and leg swelling.  Gastrointestinal: Negative for vomiting.  Musculoskeletal: Positive for back pain, joint pain and myalgias.  Skin: Negative for rash.  Neurological: Negative for loss of consciousness and  headaches.  Psychiatric/Behavioral: Positive for depression. The patient is nervous/anxious.        Objective:    Physical Exam  Constitutional: She is oriented to person, place, and time. She appears well-developed and well-nourished. No distress.  HENT:  Head: Normocephalic and atraumatic.  Eyes: Conjunctivae are normal.  Neck: Normal range of motion. No thyromegaly present.  Cardiovascular: Normal rate and regular rhythm.   Pulmonary/Chest: Effort normal and breath sounds normal. She has no wheezes.  Abdominal: Soft. Bowel sounds are normal. There is no tenderness.  Musculoskeletal: She exhibits no edema or deformity.  Neurological: She is alert and oriented to person, place, and time.  Skin: Skin is warm and dry. She is not diaphoretic.  Psychiatric: She has a normal mood and affect.    BP 127/61 (BP Location: Left Arm, Patient Position: Sitting, Cuff Size: Large)   Pulse 81   Temp 98.6 F (37 C) (Oral)   Wt 185 lb 9.6 oz (84.2 kg)   SpO2 99% Comment: RA  BMI 33.95 kg/m  Wt Readings from Last 3 Encounters:  12/05/16 185 lb 9.6 oz (84.2 kg)  10/30/16 185 lb 12.8 oz (84.3 kg)  10/16/16 182 lb (82.6 kg)   BP Readings from Last 3 Encounters:  12/05/16 127/61  10/30/16 135/66  10/16/16 134/74     Immunization History  Administered Date(s) Administered  . Influenza Split 05/21/2011, 06/18/2012  . Influenza Whole 05/12/2013  . Influenza, High Dose Seasonal PF 04/23/2016  . Influenza,inj,Quad PF,36+ Mos 03/21/2014  . Influenza-Unspecified 04/29/2015  . Pneumococcal Conjugate-13 03/21/2014  . Pneumococcal Polysaccharide-23 05/21/2011    Health Maintenance  Topic Date Due  . OPHTHALMOLOGY EXAM  04/28/2016  . FOOT EXAM  07/09/2016  . INFLUENZA VACCINE  02/26/2017  . HEMOGLOBIN A1C  05/31/2017  . URINE MICROALBUMIN  08/01/2017  . COLONOSCOPY  10/16/2017  . MAMMOGRAM  02/21/2018  . TETANUS/TDAP  08/08/2019  . DEXA SCAN  Completed  . Hepatitis C Screening   Completed  . PNA vac Low Risk Adult  Completed    Lab Results  Component Value Date   WBC 9.2 11/28/2016   HGB 11.9 (L) 11/28/2016   HCT 36.0  11/28/2016   PLT 584.0 (H) 11/28/2016   GLUCOSE 137 (H) 11/28/2016   CHOL 150 11/28/2016   TRIG 161.0 (H) 11/28/2016   HDL 54.30 11/28/2016   LDLDIRECT 109.0 09/20/2015   LDLCALC 63 11/28/2016   ALT 15 11/28/2016   AST 16 11/28/2016   NA 141 11/28/2016   K 3.5 11/28/2016   CL 103 11/28/2016   CREATININE 1.42 (H) 11/28/2016   BUN 22 11/28/2016   CO2 27 11/28/2016   TSH 2.27 11/28/2016   HGBA1C 6.9 (H) 11/28/2016   MICROALBUR 0.9 08/01/2016    Lab Results  Component Value Date   TSH 2.27 11/28/2016   Lab Results  Component Value Date   WBC 9.2 11/28/2016   HGB 11.9 (L) 11/28/2016   HCT 36.0 11/28/2016   MCV 88.1 11/28/2016   PLT 584.0 (H) 11/28/2016   Lab Results  Component Value Date   NA 141 11/28/2016   K 3.5 11/28/2016   CHLORIDE 103 10/30/2016   CO2 27 11/28/2016   GLUCOSE 137 (H) 11/28/2016   BUN 22 11/28/2016   CREATININE 1.42 (H) 11/28/2016   BILITOT 0.8 11/28/2016   ALKPHOS 61 11/28/2016   AST 16 11/28/2016   ALT 15 11/28/2016   PROT 7.5 11/28/2016   ALBUMIN 4.4 11/28/2016   CALCIUM 9.9 11/28/2016   ANIONGAP 11 10/30/2016   EGFR 44 (L) 10/30/2016   GFR 46.91 (L) 11/28/2016   Lab Results  Component Value Date   CHOL 150 11/28/2016   Lab Results  Component Value Date   HDL 54.30 11/28/2016   Lab Results  Component Value Date   LDLCALC 63 11/28/2016   Lab Results  Component Value Date   TRIG 161.0 (H) 11/28/2016   Lab Results  Component Value Date   CHOLHDL 3 11/28/2016   Lab Results  Component Value Date   HGBA1C 6.9 (H) 11/28/2016         Assessment & Plan:   Problem List Items Addressed This Visit    Diabetes mellitus ([place])    hgba1c acceptable, minimize simple carbs. Increase exercise as tolerated. Continue current meds      Relevant Orders   Hemoglobin A1c   Hypertension      Well controlled, no changes to meds. Encouraged heart healthy diet such as the DASH diet and exercise as tolerated.       Relevant Orders   CBC   Comprehensive metabolic panel   TSH   Hyperlipidemia    Tolerating statin, encouraged heart healthy diet, avoid trans fats, minimize simple carbs and saturated fats. Increase exercise as tolerated      Relevant Orders   Lipid panel   Depression with anxiety    She is struggling with her being the only caregiver of her husband as he struggles with metastatic liver disease. Lexapro 20 mg po daily      Obesity    Encouraged DASH diet, decrease po intake and increase exercise as tolerated. Needs 7-8 hours of sleep nightly. Avoid trans fats, eat small, frequent meals every 4-5 hours with lean proteins, complex carbs and healthy fats. Minimize simple carbs, consider bariatric referral      Anemia   Chronic low back pain - Primary    Encouraged moist heat and gentle stretching as tolerated. May try NSAIDs and prescription meds as directed and report if symptoms worsen or seek immediate care. Xray confirms significant disease. Proceed with MRI and consider referral referral to neurosurgery.      Relevant Medications  tiZANidine (ZANAFLEX) 4 MG tablet   Other Relevant Orders   DG Lumbar Spine Complete (Completed)      I am having Ms. Kondo start on tiZANidine. I am also having her maintain her Insulin Pen Needle, multivitamin, lidocaine, aspirin EC, atorvastatin, hydrochlorothiazide, cyclobenzaprine, triamterene-hydrochlorothiazide, hydrOXYzine, glucose blood, Insulin Glargine, gabapentin, metFORMIN, escitalopram, hydrOXYzine, HUMALOG KWIKPEN, and ONETOUCH DELICA LANCETS 37Q.  Meds ordered this encounter  Medications  . DISCONTD: ONETOUCH DELICA LANCETS 96K MISC    Sig: Use as directed to check blood sugar 4 times per day dx code E11.9    Dispense:  200 each    Refill:  3  . ONETOUCH DELICA LANCETS 38V MISC    Sig: Use as directed to  check blood sugar 4 times per day dx code E11.9    Dispense:  200 each    Refill:  3  . tiZANidine (ZANAFLEX) 4 MG tablet    Sig: Take 1 tablet (4 mg total) by mouth every 8 (eight) hours as needed for muscle spasms.    Dispense:  60 tablet    Refill:  01    CMA served as scribe during this visit. History, Physical and Plan performed by medical provider. Documentation and orders reviewed and attested to.  Penni Homans, MD

## 2016-12-08 DIAGNOSIS — M545 Low back pain, unspecified: Secondary | ICD-10-CM | POA: Insufficient documentation

## 2016-12-08 DIAGNOSIS — G8929 Other chronic pain: Secondary | ICD-10-CM | POA: Insufficient documentation

## 2016-12-08 NOTE — Assessment & Plan Note (Signed)
Well controlled, no changes to meds. Encouraged heart healthy diet such as the DASH diet and exercise as tolerated.  °

## 2016-12-08 NOTE — Assessment & Plan Note (Signed)
She is struggling with her being the only caregiver of her husband as he struggles with metastatic liver disease. Lexapro 20 mg po daily

## 2016-12-08 NOTE — Assessment & Plan Note (Signed)
Tolerating statin, encouraged heart healthy diet, avoid trans fats, minimize simple carbs and saturated fats. Increase exercise as tolerated 

## 2016-12-08 NOTE — Assessment & Plan Note (Signed)
Encouraged moist heat and gentle stretching as tolerated. May try NSAIDs and prescription meds as directed and report if symptoms worsen or seek immediate care. Xray confirms significant disease. Proceed with MRI and consider referral referral to neurosurgery.

## 2016-12-08 NOTE — Assessment & Plan Note (Signed)
hgba1c acceptable, minimize simple carbs. Increase exercise as tolerated. Continue current meds 

## 2016-12-08 NOTE — Assessment & Plan Note (Signed)
Encouraged DASH diet, decrease po intake and increase exercise as tolerated. Needs 7-8 hours of sleep nightly. Avoid trans fats, eat small, frequent meals every 4-5 hours with lean proteins, complex carbs and healthy fats. Minimize simple carbs, consider bariatric referral 

## 2016-12-14 ENCOUNTER — Ambulatory Visit (HOSPITAL_BASED_OUTPATIENT_CLINIC_OR_DEPARTMENT_OTHER)
Admission: RE | Admit: 2016-12-14 | Discharge: 2016-12-14 | Disposition: A | Discharge status: Home / Self Care | Payer: MEDICARE | Source: Ambulatory Visit | Attending: Family Medicine | Admitting: Family Medicine

## 2016-12-14 DIAGNOSIS — M545 Low back pain: Secondary | ICD-10-CM | POA: Diagnosis not present

## 2016-12-14 DIAGNOSIS — M5442 Lumbago with sciatica, left side: Secondary | ICD-10-CM | POA: Insufficient documentation

## 2016-12-14 DIAGNOSIS — M5441 Lumbago with sciatica, right side: Secondary | ICD-10-CM | POA: Diagnosis not present

## 2016-12-14 DIAGNOSIS — G8929 Other chronic pain: Secondary | ICD-10-CM | POA: Diagnosis not present

## 2016-12-14 DIAGNOSIS — M48061 Spinal stenosis, lumbar region without neurogenic claudication: Secondary | ICD-10-CM | POA: Diagnosis not present

## 2016-12-16 ENCOUNTER — Other Ambulatory Visit: Payer: Self-pay | Admitting: Family Medicine

## 2016-12-16 DIAGNOSIS — R9389 Abnormal findings on diagnostic imaging of other specified body structures: Secondary | ICD-10-CM

## 2016-12-18 ENCOUNTER — Other Ambulatory Visit: Payer: Self-pay | Admitting: Endocrinology

## 2016-12-18 DIAGNOSIS — E083299 Diabetes mellitus due to underlying condition with mild nonproliferative diabetic retinopathy without macular edema, unspecified eye: Secondary | ICD-10-CM

## 2016-12-18 NOTE — Telephone Encounter (Signed)
Just wanted to make sure it is okay to refuse this Rx and send a response that patient needs an appointment? They have no scheduled appointments and last OV was on 09/07/2015. Please advise.

## 2016-12-18 NOTE — Telephone Encounter (Signed)
Yes

## 2016-12-18 NOTE — Telephone Encounter (Signed)
Rx refused

## 2016-12-25 ENCOUNTER — Other Ambulatory Visit: Payer: Self-pay

## 2016-12-25 DIAGNOSIS — E083299 Diabetes mellitus due to underlying condition with mild nonproliferative diabetic retinopathy without macular edema, unspecified eye: Secondary | ICD-10-CM

## 2016-12-27 ENCOUNTER — Other Ambulatory Visit: Payer: Self-pay | Admitting: Family Medicine

## 2017-01-05 ENCOUNTER — Other Ambulatory Visit: Payer: Self-pay | Admitting: Family Medicine

## 2017-01-06 DIAGNOSIS — I10 Essential (primary) hypertension: Secondary | ICD-10-CM | POA: Diagnosis not present

## 2017-01-06 DIAGNOSIS — Z6833 Body mass index (BMI) 33.0-33.9, adult: Secondary | ICD-10-CM | POA: Diagnosis not present

## 2017-01-06 DIAGNOSIS — M47816 Spondylosis without myelopathy or radiculopathy, lumbar region: Secondary | ICD-10-CM | POA: Diagnosis not present

## 2017-01-06 DIAGNOSIS — M545 Low back pain: Secondary | ICD-10-CM | POA: Diagnosis not present

## 2017-01-06 DIAGNOSIS — M4317 Spondylolisthesis, lumbosacral region: Secondary | ICD-10-CM | POA: Diagnosis not present

## 2017-01-15 ENCOUNTER — Ambulatory Visit: Payer: MEDICARE | Attending: Neurosurgery | Admitting: Physical Therapy

## 2017-01-15 ENCOUNTER — Encounter: Payer: Self-pay | Admitting: Physical Therapy

## 2017-01-15 DIAGNOSIS — M62830 Muscle spasm of back: Secondary | ICD-10-CM | POA: Insufficient documentation

## 2017-01-15 DIAGNOSIS — M545 Low back pain, unspecified: Secondary | ICD-10-CM

## 2017-01-15 NOTE — Therapy (Signed)
[facility] [place], [place], [address] Phone: [phone]   Fax:  [phone]  Physical Therapy Evaluation  Patient Details  Name: Beth Maldonado MRN: [id number] Date of Birth: April 27, 1946 Referring Provider: Kathyrn Sheriff  Encounter Date: 01/15/2017      PT End of Session - 01/15/17 1034    Visit Number 1   Date for PT Re-Evaluation 03/17/17   PT Start Time 1000   PT Stop Time 0150   PT Time Calculation (min) 950 min   Activity Tolerance Patient tolerated treatment well   Behavior During Therapy [facility] for tasks assessed/performed      Past Medical History:  Diagnosis Date  . Acute bronchitis 03/29/2013  . Acute upper respiratory infection 11/27/2014  . Allergy   . Anemia 07/02/2015  . Arthritis   . Back pain    upper and lower last 20 years  . Cancer of left colon ([place]) 12/18/2015  . Depression with anxiety 06/27/2013  . Diabetes mellitus   . Hx of adenomatous colonic polyps 10/27/2015  . Hyperlipidemia   . Hypertension   . Insomnia 06/30/2015  . Medicare annual wellness visit, subsequent 03/23/2014  . Neck pain on left side 05/29/2014   hx of  . Neuromuscular disorder (HCC)    neuropathy feet  . Neuropathy   . Osteopenia   . Preventative health care 07/02/2015  . Tinnitus    RIGHT EAR ALL THE TIME    Past Surgical History:  Procedure Laterality Date  . BREAST ENHANCEMENT SURGERY Bilateral 1987   Reduction  . CATARACT EXTRACTION, BILATERAL  2011  . COLONOSCOPY  2017   colon cancer  . [place]  . KNEE ARTHROSCOPY Right 2003  . LAPAROSCOPIC SIGMOID COLECTOMY N/A 12/18/2015   Procedure: LAPAROSCOPIC SIGMOID COLECTOMY;  Surgeon: Alphonsa Overall, MD;  Location: [facility];  Service: General;  Laterality: N/A;  . TUBAL LIGATION  1970  . VAGINAL HYSTERECTOMY  1974   partial    There were no vitals filed for this visit.       Subjective Assessment - 01/15/17 1004    Subjective Patient  reports that she has been having low back pain about a year and worse in the past 3 months.  X-rays showed DDD and spondylosis.     Limitations Lifting;Standing;House hold activities   How long can you stand comfortably? 10 minutes   Patient Stated Goals have less pain   Currently in Pain? Yes   Pain Score 1    Pain Location Back   Pain Orientation Lower   Pain Descriptors / Indicators Aching   Pain Type Acute pain   Pain Onset More than a month ago   Pain Frequency Intermittent   Aggravating Factors  being up and active, walking, standing, pain was up to 10/10   Pain Relieving Factors reports that a new medication has really helped her pain levels    Effect of Pain on Daily Activities very difficult with walking and standing            [facility] PT Assessment - 01/15/17 0001      Assessment   Medical Diagnosis lumbar DDD and spondylosis   Referring Provider Nundkumar   Onset Date/Surgical Date 12/15/16     Precautions   Precautions None     Balance Screen   Has the patient fallen in the past 6 months No   Has the patient had a decrease in activity level because  of a fear of falling?  No   Is the patient reluctant to leave their home because of a fear of falling?  No     Home Environment   Additional Comments does her own housework, likes to plant flowers     Prior Function   Level of Independence Independent   Vocation Retired   Leisure doing some exercises at home an exercise on TV     Posture/Postural Control   Posture Comments fwd head, rounded shoulders     ROM / Strength   AROM / PROM / Strength AROM;Strength     AROM   Overall AROM Comments Lumbar ROM was WNL's for flexion, decreased 25% for extension and side bending     Strength   Overall Strength Comments 4/5 with minimal discomfort     Flexibility   Soft Tissue Assessment /Muscle Length --  has some tightness in the calves and HS     Palpation   Palpation comment very tight in the lumbar area and  into the buttocks            Objective measurements completed on examination: See above findings.          [facility] Adult PT Treatment/Exercise - 01/15/17 0001      Modalities   Modalities Electrical Stimulation;Moist Heat     Moist Heat Therapy   Number Minutes Moist Heat 15 Minutes   Moist Heat Location Lumbar Spine     Electrical Stimulation   Electrical Stimulation Location lumbar spine   Electrical Stimulation Action IFC   Electrical Stimulation Parameters supine   Electrical Stimulation Goals Pain                PT Education - 01/15/17 1034    Education provided Yes   Education Details Wms Flexion   Person(s) Educated Patient   Methods Explanation;Demonstration;Handout   Comprehension Verbalized understanding          PT Short Term Goals - 01/15/17 1038      PT SHORT TERM GOAL #1   Title independent with initial HEP   Time 2   Period Weeks   Status New           PT Long Term Goals - 01/15/17 1038      PT LONG TERM GOAL #1   Title decrease pain 50%   Time 8   Period Weeks   Status New     PT LONG TERM GOAL #2   Title walk and shop without increase of pain   Time 8   Period Weeks   Status New     PT LONG TERM GOAL #3   Title increase lumbar ROM to WNL's   Time 8   Period Weeks   Status New     PT LONG TERM GOAL #4   Title understand posture and body mechanics instruction   Time 8   Period Weeks   Status New                Plan - 01/15/17 1035    Clinical Impression Statement Patient has low back pain that has been much less since starting a new medication from the MD.  X-rays show DDD and spondylosis.  She has mild ROM limitations in extension and side bending.  She has some mild tightness in the calves and HS.  She will need posture and body mechanics instruction.   Clinical Presentation Stable   Clinical Decision Making Low   Rehab Potential  Good   PT Frequency 1x / week   PT Duration 8 weeks   PT  Treatment/Interventions ADLs/Self Care Home Management;Cryotherapy;Electrical Stimulation;Moist Heat;Traction;Therapeutic activities;Therapeutic exercise;Neuromuscular re-education;Patient/family education;Manual techniques   PT Next Visit Plan start gym exercises and posture and body mechanics    Consulted and Agree with Plan of Care Patient      Patient will benefit from skilled therapeutic intervention in order to improve the following deficits and impairments:  Decreased activity tolerance, Decreased mobility, Decreased strength, Postural dysfunction, Improper body mechanics, Impaired flexibility, Pain, Increased muscle spasms, Decreased range of motion, Difficulty walking  Visit Diagnosis: Acute bilateral low back pain without sciatica - Plan: PT plan of care cert/re-cert  Muscle spasm of back - Plan: PT plan of care cert/re-cert      G-Codes - 55/37/48 1039    Functional Assessment Tool Used (Outpatient Only) foto 47% limitation   Functional Limitation Other PT primary   Other PT Primary Current Status (O7078) At least 40 percent but less than 60 percent impaired, limited or restricted   Other PT Primary Goal Status (M7544) At least 20 percent but less than 40 percent impaired, limited or restricted       Problem List Patient Active Problem List   Diagnosis Date Noted  . Chronic low back pain 12/08/2016  . Cancer of left colon ([place]) 12/18/2015  . Hx of adenomatous colonic polyps and colon cancer 10/27/2015  . Anemia 07/02/2015  . Preventative health care 07/02/2015  . Insomnia 06/30/2015  . Obesity 11/27/2014  . Neck pain on left side 05/29/2014  . Dyspnea 04/27/2014  . Pain in joint, shoulder region 03/23/2014  . Medicare annual wellness visit, subsequent 03/23/2014  . Hematuria 11/21/2013  . Thrombocytosis ([place]) 10/10/2013  . Unspecified hereditary and idiopathic peripheral neuropathy 10/10/2013  . Depression with anxiety 06/27/2013  . Osteopenia   . Falls  11/17/2011  . Diabetes mellitus ([place]) 02/20/2011  . Hypertension 02/20/2011  . Hyperlipidemia 02/20/2011  . 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

## 2017-01-17 ENCOUNTER — Other Ambulatory Visit: Payer: Self-pay | Admitting: Family Medicine

## 2017-01-22 ENCOUNTER — Encounter: Payer: Self-pay | Admitting: Physical Therapy

## 2017-01-22 ENCOUNTER — Ambulatory Visit: Payer: MEDICARE | Admitting: Physical Therapy

## 2017-01-22 DIAGNOSIS — M545 Low back pain, unspecified: Secondary | ICD-10-CM

## 2017-01-22 DIAGNOSIS — M62830 Muscle spasm of back: Secondary | ICD-10-CM

## 2017-01-22 NOTE — Therapy (Signed)
[place] [place], [place], [address] Phone: [phone]   Fax:  [phone]  Physical Therapy Treatment  Patient Details  Name: Beth Maldonado MRN: [id number] Date of Birth: 09-11-1945 Referring Provider: Kathyrn Sheriff  Encounter Date: 01/22/2017      PT End of Session - 01/22/17 1047    Visit Number 2   Date for PT Re-Evaluation 03/17/17   PT Start Time 1000   PT Stop Time 1057   PT Time Calculation (min) 57 min   Activity Tolerance Patient tolerated treatment well   Behavior During Therapy [facility] for tasks assessed/performed      Past Medical History:  Diagnosis Date  . Acute bronchitis 03/29/2013  . Acute upper respiratory infection 11/27/2014  . Allergy   . Anemia 07/02/2015  . Arthritis   . Back pain    upper and lower last 20 years  . Cancer of left colon ([place]) 12/18/2015  . Depression with anxiety 06/27/2013  . Diabetes mellitus   . Hx of adenomatous colonic polyps 10/27/2015  . Hyperlipidemia   . Hypertension   . Insomnia 06/30/2015  . Medicare annual wellness visit, subsequent 03/23/2014  . Neck pain on left side 05/29/2014   hx of  . Neuromuscular disorder (HCC)    neuropathy feet  . Neuropathy   . Osteopenia   . Preventative health care 07/02/2015  . Tinnitus    RIGHT EAR ALL THE TIME    Past Surgical History:  Procedure Laterality Date  . BREAST ENHANCEMENT SURGERY Bilateral 1987   Reduction  . CATARACT EXTRACTION, BILATERAL  2011  . COLONOSCOPY  2017   colon cancer  . [place]  . KNEE ARTHROSCOPY Right 2003  . LAPAROSCOPIC SIGMOID COLECTOMY N/A 12/18/2015   Procedure: LAPAROSCOPIC SIGMOID COLECTOMY;  Surgeon: Alphonsa Overall, MD;  Location: [facility];  Service: General;  Laterality: N/A;  . TUBAL LIGATION  1970  . VAGINAL HYSTERECTOMY  1974   partial    There were no vitals filed for this visit.      Subjective Assessment - 01/22/17 1002    Subjective "doing  pretty good other than the pain came back"   Currently in Pain? No/denies   Pain Score 0-No pain                         [facility] Adult PT Treatment/Exercise - 01/22/17 0001      Exercises   Exercises Lumbar     Lumbar Exercises: Stretches   Passive Hamstring Stretch 4 reps;10 seconds   Piriformis Stretch 3 reps;10 seconds     Lumbar Exercises: Aerobic   Elliptical NuStep L5 x6 min      Lumbar Exercises: Machines for Strengthening   Cybex Knee Extension 5lb 2x10   Cybex Knee Flexion 25lb 2x10    Other Lumbar Machine Exercise Rows and lats 25lb 2x10      Lumbar Exercises: Standing   Row 15 reps;Theraband  x2   Theraband Level (Row) Level 3 (Green)   Shoulder Extension Both;15 reps;Theraband  x2   Theraband Level (Shoulder Extension) Level 3 (Green)     Lumbar Exercises: Seated   Sit to Stand 10 reps  x2, holding yellow ball      Modalities   Modalities Electrical Stimulation;Moist Heat     Moist Heat Therapy   Number Minutes Moist Heat 15 Minutes   Moist Heat Location Lumbar Spine  Water quality scientist IFC   Electrical Stimulation Parameters supine   Electrical Stimulation Goals Pain                  PT Short Term Goals - 01/15/17 1038      PT SHORT TERM GOAL #1   Title independent with initial HEP   Time 2   Period Weeks   Status New           PT Long Term Goals - 01/15/17 1038      PT LONG TERM GOAL #1   Title decrease pain 50%   Time 8   Period Weeks   Status New     PT LONG TERM GOAL #2   Title walk and shop without increase of pain   Time 8   Period Weeks   Status New     PT LONG TERM GOAL #3   Title increase lumbar ROM to WNL's   Time 8   Period Weeks   Status New     PT LONG TERM GOAL #4   Title understand posture and body mechanics instruction   Time 8   Period Weeks   Status New               Plan -  01/22/17 1049    Clinical Impression Statement Pt tolerated an initial progression to exercises well with no reports of increase pain. Does have tight HS on RLE. Cues needed with standing Tband interventions for posture.    Rehab Potential Good   PT Frequency 1x / week   PT Duration 8 weeks   PT Treatment/Interventions ADLs/Self Care Home Management;Cryotherapy;Electrical Stimulation;Moist Heat;Traction;Therapeutic activities;Therapeutic exercise;Neuromuscular re-education;Patient/family education;Manual techniques   PT Next Visit Plan start gym exercises and posture and body mechanics       Patient will benefit from skilled therapeutic intervention in order to improve the following deficits and impairments:  Decreased activity tolerance, Decreased mobility, Decreased strength, Postural dysfunction, Improper body mechanics, Impaired flexibility, Pain, Increased muscle spasms, Decreased range of motion, Difficulty walking  Visit Diagnosis: Acute bilateral low back pain without sciatica  Muscle spasm of back     Problem List Patient Active Problem List   Diagnosis Date Noted  . Chronic low back pain 12/08/2016  . Cancer of left colon ([place]) 12/18/2015  . Hx of adenomatous colonic polyps and colon cancer 10/27/2015  . Anemia 07/02/2015  . Preventative health care 07/02/2015  . Insomnia 06/30/2015  . Obesity 11/27/2014  . Neck pain on left side 05/29/2014  . Dyspnea 04/27/2014  . Pain in joint, shoulder region 03/23/2014  . Medicare annual wellness visit, subsequent 03/23/2014  . Hematuria 11/21/2013  . Thrombocytosis ([place]) 10/10/2013  . Unspecified hereditary and idiopathic peripheral neuropathy 10/10/2013  . Depression with anxiety 06/27/2013  . Osteopenia   . Falls 11/17/2011  . Diabetes mellitus ([place]) 02/20/2011  . Hypertension 02/20/2011  . Hyperlipidemia 02/20/2011  . 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

## 2017-01-29 ENCOUNTER — Other Ambulatory Visit: Payer: Self-pay | Admitting: Family Medicine

## 2017-02-05 ENCOUNTER — Other Ambulatory Visit: Payer: Self-pay | Admitting: Family Medicine

## 2017-02-14 ENCOUNTER — Telehealth: Payer: Self-pay | Admitting: Family Medicine

## 2017-02-14 DIAGNOSIS — E083299 Diabetes mellitus due to underlying condition with mild nonproliferative diabetic retinopathy without macular edema, unspecified eye: Secondary | ICD-10-CM

## 2017-02-14 MED ORDER — GLUCOSE BLOOD VI STRP: ORAL_STRIP | 3 refills | Status: DC

## 2017-02-14 NOTE — Telephone Encounter (Signed)
Called pharmacy back and told the pharmacist she did not update her pharmacy. I have sent over her new rx to her new pharmacy.   PC

## 2017-02-14 NOTE — Telephone Encounter (Signed)
P[place]on- [phone]  Called in to request a new Rx for pt's One Touch. She said that Rx isn't written for medicare.    It need to reflect the quantity multiples of 50, and have a Dx code attached.

## 2017-02-24 ENCOUNTER — Other Ambulatory Visit: Payer: Self-pay | Admitting: Family Medicine

## 2017-03-03 ENCOUNTER — Encounter: Payer: Self-pay | Admitting: Physical Therapy

## 2017-03-03 ENCOUNTER — Other Ambulatory Visit (INDEPENDENT_AMBULATORY_CARE_PROVIDER_SITE_OTHER): Payer: MEDICARE

## 2017-03-03 ENCOUNTER — Ambulatory Visit: Payer: MEDICARE | Attending: Neurosurgery | Admitting: Physical Therapy

## 2017-03-03 DIAGNOSIS — M545 Low back pain, unspecified: Secondary | ICD-10-CM

## 2017-03-03 DIAGNOSIS — M62830 Muscle spasm of back: Secondary | ICD-10-CM | POA: Insufficient documentation

## 2017-03-03 DIAGNOSIS — I10 Essential (primary) hypertension: Secondary | ICD-10-CM | POA: Diagnosis not present

## 2017-03-03 DIAGNOSIS — E782 Mixed hyperlipidemia: Secondary | ICD-10-CM | POA: Diagnosis not present

## 2017-03-03 DIAGNOSIS — E083299 Diabetes mellitus due to underlying condition with mild nonproliferative diabetic retinopathy without macular edema, unspecified eye: Secondary | ICD-10-CM | POA: Diagnosis not present

## 2017-03-03 LAB — CBC
HCT: 33.4 % — ABNORMAL LOW (ref 36.0–46.0)
Hemoglobin: 10.8 g/dL — ABNORMAL LOW (ref 12.0–15.0)
MCHC: 32.4 g/dL (ref 30.0–36.0)
MCV: 89.4 fl (ref 78.0–100.0)
Platelets: 591 10*3/uL — ABNORMAL HIGH (ref 150.0–400.0)
RBC: 3.74 Mil/uL — AB (ref 3.87–5.11)
RDW: 13.9 % (ref 11.5–15.5)
WBC: 9.9 10*3/uL (ref 4.0–10.5)

## 2017-03-03 LAB — COMPREHENSIVE METABOLIC PANEL
ALBUMIN: 4.4 g/dL (ref 3.5–5.2)
ALK PHOS: 59 U/L (ref 39–117)
ALT: 18 U/L (ref 0–35)
AST: 18 U/L (ref 0–37)
BILIRUBIN TOTAL: 0.8 mg/dL (ref 0.2–1.2)
BUN: 22 mg/dL (ref 6–23)
CO2: 23 mEq/L (ref 19–32)
CREATININE: 1.91 mg/dL — AB (ref 0.40–1.20)
Calcium: 9.7 mg/dL (ref 8.4–10.5)
Chloride: 101 mEq/L (ref 96–112)
GFR: 33.3 mL/min — ABNORMAL LOW (ref >60.00)
GLUCOSE: 240 mg/dL — AB (ref 70–99)
Potassium: 3.5 mEq/L (ref 3.5–5.1)
SODIUM: 138 meq/L (ref 135–145)
TOTAL PROTEIN: 7.8 g/dL (ref 6.0–8.3)

## 2017-03-03 LAB — HEMOGLOBIN A1C: HEMOGLOBIN A1C: 8.3 % — AB (ref 4.6–6.5)

## 2017-03-03 LAB — LIPID PANEL
CHOL/HDL RATIO: 3
Cholesterol: 137 mg/dL (ref 0–200)
HDL: 46.2 mg/dL (ref >39.00)
LDL Cholesterol: 62 mg/dL (ref 0–99)
NONHDL: 91.07
Triglycerides: 147 mg/dL (ref 0.0–149.0)
VLDL: 29.4 mg/dL (ref 0.0–40.0)

## 2017-03-03 LAB — TSH: TSH: 3.19 u[IU]/mL (ref 0.35–4.50)

## 2017-03-03 NOTE — Therapy (Addendum)
[place], [place], [address] Phone: [phone]   Fax:  [phone]  Physical Therapy Treatment  Patient Details  Name: Beth Maldonado MRN: [id number] Date of Birth: Oct 24, 1945 Referring Provider: Kathyrn Sheriff  Encounter Date: 03/03/2017      PT End of Session - 03/03/17 1343    Visit Number 3   Date for PT Re-Evaluation 03/17/17   PT Start Time 1300   PT Stop Time 1356   PT Time Calculation (Maldonado) 56 Maldonado   Activity Tolerance Patient tolerated treatment well   Behavior During Therapy [facility] for tasks assessed/performed      Past Medical History:  Diagnosis Date  . Acute bronchitis 03/29/2013  . Acute upper respiratory infection 11/27/2014  . Allergy   . Anemia 07/02/2015  . Arthritis   . Back pain    upper and lower last 20 years  . Cancer of left colon ([place]) 12/18/2015  . Depression with anxiety 06/27/2013  . Diabetes mellitus   . Hx of adenomatous colonic polyps 10/27/2015  . Hyperlipidemia   . Hypertension   . Insomnia 06/30/2015  . Medicare annual wellness visit, subsequent 03/23/2014  . Neck pain on left side 05/29/2014   hx of  . Neuromuscular disorder (HCC)    neuropathy feet  . Neuropathy   . Osteopenia   . Preventative health care 07/02/2015  . Tinnitus    RIGHT EAR ALL THE TIME    Past Surgical History:  Procedure Laterality Date  . BREAST ENHANCEMENT SURGERY Bilateral 1987   Reduction  . CATARACT EXTRACTION, BILATERAL  2011  . COLONOSCOPY  2017   colon cancer  . [place]  . KNEE ARTHROSCOPY Right 2003  . LAPAROSCOPIC SIGMOID COLECTOMY N/A 12/18/2015   Procedure: LAPAROSCOPIC SIGMOID COLECTOMY;  Surgeon: Alphonsa Overall, MD;  Location: [facility];  Service: General;  Laterality: N/A;  . TUBAL LIGATION  1970  . VAGINAL HYSTERECTOMY  1974   partial    There were no vitals filed for this visit.      Subjective Assessment - 03/03/17 1303    Subjective Pt reports  that she has been having to take care of her husband, lifting and rolling him over "Dead weight"   Currently in Pain? Yes   Pain Score 10-Worst pain ever   Pain Location Back            [facility] PT Assessment - 03/03/17 0001      AROM   Overall AROM Comments Lumbar ROM was WNL's f                     [facility] Adult PT Treatment/Exercise - 03/03/17 0001      Lumbar Exercises: Aerobic   Elliptical NuStep L4 x7 Maldonado      Lumbar Exercises: Machines for Strengthening   Cybex Knee Extension 5lb 2x10   Cybex Knee Flexion 25lb 2x10    Leg Press 20lb 2x10   Other Lumbar Machine Exercise Rows and lats 25lb 2x10      Lumbar Exercises: Standing   Other Standing Lumbar Exercises Standing over head ext red ball 2x10      Lumbar Exercises: Seated   Sit to Stand 10 reps  x2     Lumbar Exercises: Supine   Bridge 15 reps;Compliant     Modalities   Modalities Electrical Stimulation;Moist Heat     Moist Heat Therapy   Number Minutes Moist Heat 15  Minutes   Moist Heat Location Lumbar Spine     Electrical Stimulation   Electrical Stimulation Location lumbar spine   Electrical Stimulation Action IFC   Electrical Stimulation Parameters supine   Electrical Stimulation Goals Pain                  PT Short Term Goals - 01/15/17 1038      PT SHORT TERM GOAL #1   Title independent with initial HEP   Time 2   Period Weeks   Status New           PT Long Term Goals - 03/03/17 1335      PT LONG TERM GOAL #1   Title decrease pain 50%   Status On-going     PT LONG TERM GOAL #2   Title walk and shop without increase of pain   Status On-going     PT LONG TERM GOAL #3   Title increase lumbar ROM to WNL's   Status Achieved     PT LONG TERM GOAL #4   Title understand posture and body mechanics instruction   Status On-going               Plan - 03/03/17 1344    Clinical Impression Statement Despite high pain ratting pt able to complete all of  today's exercises. Does reports some increase in pain with bridges. Pt has progressed increasing her lumbar ROM.    Rehab Potential Good   PT Frequency 1x / week   PT Duration 8 weeks   PT Next Visit Plan gym exercises and posture and body mechanics       Patient will benefit from skilled therapeutic intervention in order to improve the following deficits and impairments:  Decreased activity tolerance, Decreased mobility, Decreased strength, Postural dysfunction, Improper body mechanics, Impaired flexibility, Pain, Increased muscle spasms, Decreased range of motion, Difficulty walking  Visit Diagnosis: Muscle spasm of back  Acute bilateral low back pain without sciatica     Problem List Patient Active Problem List   Diagnosis Date Noted  . Chronic low back pain 12/08/2016  . Cancer of left colon ([place]) 12/18/2015  . Hx of adenomatous colonic polyps and colon cancer 10/27/2015  . Anemia 07/02/2015  . Preventative health care 07/02/2015  . Insomnia 06/30/2015  . Obesity 11/27/2014  . Neck pain on left side 05/29/2014  . Dyspnea 04/27/2014  . Pain in joint, shoulder region 03/23/2014  . Medicare annual wellness visit, subsequent 03/23/2014  . Hematuria 11/21/2013  . Thrombocytosis ([place]) 10/10/2013  . Unspecified hereditary and idiopathic peripheral neuropathy 10/10/2013  . Depression with anxiety 06/27/2013  . Osteopenia   . Falls 11/17/2011  . Diabetes mellitus ([place]) 02/20/2011  . Hypertension 02/20/2011  . Hyperlipidemia 02/20/2011  . 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  Patient goals were partially met. Patient is being discharged due to being pleased with the current functional level.  ?????     Scot Jun, PTA 03/03/2017, 1:45 PM  [place] [address] [place], [place], [address] Phone:  [phone]   Fax:  [phone]  Name: Beth Maldonado MRN: [id number] Date of Birth: [birth year]

## 2017-03-10 ENCOUNTER — Ambulatory Visit: Payer: MEDICARE | Admitting: Family Medicine

## 2017-03-10 ENCOUNTER — Ambulatory Visit: Payer: MEDICARE | Admitting: Physical Therapy

## 2017-03-10 DIAGNOSIS — Z0289 Encounter for other administrative examinations: Secondary | ICD-10-CM

## 2017-03-31 ENCOUNTER — Other Ambulatory Visit: Payer: Self-pay | Admitting: Family Medicine

## 2017-04-01 NOTE — Telephone Encounter (Signed)
Pt needs appt for next fill/thx dmf

## 2017-04-29 ENCOUNTER — Other Ambulatory Visit: Payer: Self-pay | Admitting: Family Medicine

## 2017-05-01 ENCOUNTER — Other Ambulatory Visit: Payer: MEDICARE

## 2017-05-01 ENCOUNTER — Ambulatory Visit: Payer: MEDICARE | Admitting: Hematology & Oncology

## 2017-05-02 ENCOUNTER — Other Ambulatory Visit: Payer: Self-pay | Admitting: Family Medicine

## 2017-05-15 ENCOUNTER — Other Ambulatory Visit: Payer: Self-pay | Admitting: Family Medicine

## 2017-05-15 DIAGNOSIS — Z23 Encounter for immunization: Secondary | ICD-10-CM | POA: Diagnosis not present

## 2017-05-22 DIAGNOSIS — E119 Type 2 diabetes mellitus without complications: Secondary | ICD-10-CM | POA: Diagnosis not present

## 2017-05-22 DIAGNOSIS — Z794 Long term (current) use of insulin: Secondary | ICD-10-CM | POA: Diagnosis not present

## 2017-05-22 DIAGNOSIS — H524 Presbyopia: Secondary | ICD-10-CM | POA: Diagnosis not present

## 2017-05-22 LAB — HM DIABETES EYE EXAM

## 2017-05-27 ENCOUNTER — Other Ambulatory Visit: Payer: Self-pay | Admitting: Family Medicine

## 2017-05-27 ENCOUNTER — Encounter: Payer: Self-pay | Admitting: Family Medicine

## 2017-05-27 DIAGNOSIS — Z1231 Encounter for screening mammogram for malignant neoplasm of breast: Secondary | ICD-10-CM | POA: Diagnosis not present

## 2017-05-27 LAB — HM MAMMOGRAPHY: HM MAMMO: ABNORMAL — AB (ref 0–4)

## 2017-05-29 ENCOUNTER — Telehealth: Payer: Self-pay | Admitting: *Deleted

## 2017-05-29 ENCOUNTER — Encounter: Payer: Self-pay | Admitting: Family Medicine

## 2017-05-29 NOTE — Telephone Encounter (Signed)
Received results from [facility]; forwarded to provider/SLS 11/01

## 2017-05-30 ENCOUNTER — Other Ambulatory Visit (INDEPENDENT_AMBULATORY_CARE_PROVIDER_SITE_OTHER): Payer: MEDICARE

## 2017-05-30 DIAGNOSIS — I10 Essential (primary) hypertension: Secondary | ICD-10-CM | POA: Diagnosis not present

## 2017-05-30 DIAGNOSIS — E083299 Diabetes mellitus due to underlying condition with mild nonproliferative diabetic retinopathy without macular edema, unspecified eye: Secondary | ICD-10-CM | POA: Diagnosis not present

## 2017-05-30 DIAGNOSIS — E782 Mixed hyperlipidemia: Secondary | ICD-10-CM | POA: Diagnosis not present

## 2017-05-30 LAB — COMPREHENSIVE METABOLIC PANEL
ALK PHOS: 56 U/L (ref 39–117)
ALT: 13 U/L (ref 0–35)
AST: 13 U/L (ref 0–37)
Albumin: 4.1 g/dL (ref 3.5–5.2)
BUN: 19 mg/dL (ref 6–23)
CO2: 28 meq/L (ref 19–32)
CREATININE: 1.54 mg/dL — AB (ref 0.40–1.20)
Calcium: 9.3 mg/dL (ref 8.4–10.5)
Chloride: 103 mEq/L (ref 96–112)
GFR: 42.66 mL/min — ABNORMAL LOW (ref >60.00)
GLUCOSE: 120 mg/dL — AB (ref 70–99)
Potassium: 3.7 mEq/L (ref 3.5–5.1)
SODIUM: 140 meq/L (ref 135–145)
TOTAL PROTEIN: 7.3 g/dL (ref 6.0–8.3)
Total Bilirubin: 0.7 mg/dL (ref 0.2–1.2)

## 2017-05-30 LAB — CBC
HCT: 32.7 % — ABNORMAL LOW (ref 36.0–46.0)
Hemoglobin: 10.6 g/dL — ABNORMAL LOW (ref 12.0–15.0)
MCHC: 32.4 g/dL (ref 30.0–36.0)
MCV: 89.9 fl (ref 78.0–100.0)
Platelets: 589 10*3/uL — ABNORMAL HIGH (ref 150.0–400.0)
RBC: 3.64 Mil/uL — ABNORMAL LOW (ref 3.87–5.11)
RDW: 13.9 % (ref 11.5–15.5)
WBC: 7.9 10*3/uL (ref 4.0–10.5)

## 2017-05-30 LAB — LIPID PANEL
Cholesterol: 126 mg/dL (ref 0–200)
HDL: 51.2 mg/dL (ref >39.00)
LDL Cholesterol: 54 mg/dL (ref 0–99)
NonHDL: 74.38
Total CHOL/HDL Ratio: 2
Triglycerides: 103 mg/dL (ref 0.0–149.0)
VLDL: 20.6 mg/dL (ref 0.0–40.0)

## 2017-05-30 LAB — HEMOGLOBIN A1C: HEMOGLOBIN A1C: 8.6 % — AB (ref 4.6–6.5)

## 2017-05-30 LAB — TSH: TSH: 2.11 u[IU]/mL (ref 0.35–4.50)

## 2017-06-02 NOTE — Progress Notes (Signed)
[facility]  IMPRESSION: INCOMPLETE-ADDITIONAL IMAGING EVALUATION NEEDED The 0.9 cm group of calcifications in the breast are indeterminate. Stop magnification and lateral views are recommended.

## 2017-06-03 ENCOUNTER — Other Ambulatory Visit: Payer: Self-pay | Admitting: Family Medicine

## 2017-06-03 ENCOUNTER — Other Ambulatory Visit: Payer: Self-pay

## 2017-06-03 MED ORDER — METFORMIN HCL 500 MG PO TABS: ORAL_TABLET | ORAL | 0 refills | Status: DC

## 2017-06-06 ENCOUNTER — Ambulatory Visit: Payer: MEDICARE | Admitting: Family Medicine

## 2017-06-09 ENCOUNTER — Other Ambulatory Visit: Payer: Self-pay | Admitting: Family Medicine

## 2017-06-09 MED ORDER — METFORMIN HCL 500 MG PO TABS: ORAL_TABLET | ORAL | 0 refills | Status: DC

## 2017-06-10 ENCOUNTER — Other Ambulatory Visit: Payer: Self-pay | Admitting: Family Medicine

## 2017-07-01 ENCOUNTER — Other Ambulatory Visit: Payer: Self-pay | Admitting: Family Medicine

## 2017-07-15 ENCOUNTER — Encounter: Payer: Self-pay | Admitting: Family Medicine

## 2017-07-15 ENCOUNTER — Ambulatory Visit (INDEPENDENT_AMBULATORY_CARE_PROVIDER_SITE_OTHER): Payer: MEDICARE | Admitting: Family Medicine

## 2017-07-15 DIAGNOSIS — F418 Other specified anxiety disorders: Secondary | ICD-10-CM | POA: Diagnosis not present

## 2017-07-15 DIAGNOSIS — E083299 Diabetes mellitus due to underlying condition with mild nonproliferative diabetic retinopathy without macular edema, unspecified eye: Secondary | ICD-10-CM

## 2017-07-15 DIAGNOSIS — E782 Mixed hyperlipidemia: Secondary | ICD-10-CM

## 2017-07-15 MED ORDER — INSULIN LISPRO 100 UNIT/ML (KWIKPEN): 7.0000 [IU] | PEN_INJECTOR | Freq: Three times a day (TID) | SUBCUTANEOUS | 2 refills | Status: DC

## 2017-07-15 MED ORDER — INSULIN GLARGINE 300 UNIT/ML ~~LOC~~ SOPN: 22.0000 [IU] | PEN_INJECTOR | Freq: Every day | SUBCUTANEOUS | 3 refills | Status: DC

## 2017-07-15 NOTE — Patient Instructions (Signed)
[employer] Carbohydrate Counting for Diabetes Mellitus, Adult Carbohydrate counting is a method for keeping track of how many carbohydrates you eat. Eating carbohydrates naturally increases the amount of sugar (glucose) in the blood. Counting how many carbohydrates you eat helps keep your blood glucose within normal limits, which helps you manage your diabetes (diabetes mellitus). It is important to know how many carbohydrates you can safely have in each meal. This is different for every person. A diet and nutrition specialist (registered dietitian) can help you make a meal plan and calculate how many carbohydrates you should have at each meal and snack. Carbohydrates are found in the following foods:  Grains, such as breads and cereals.  Dried beans and soy products.  Starchy vegetables, such as potatoes, peas, and corn.  Fruit and fruit juices.  Milk and yogurt.  Sweets and snack foods, such as cake, cookies, candy, chips, and soft drinks.  How do I count carbohydrates? There are two ways to count carbohydrates in food. You can use either of the methods or a combination of both. Reading "Nutrition Facts" on packaged food The "Nutrition Facts" list is included on the labels of almost all packaged foods and beverages in the [place] It includes:  The serving size.  Information about nutrients in each serving, including the grams (g) of carbohydrate per serving.  To use the "Nutrition Facts":  Decide how many servings you will have.  Multiply the number of servings by the number of carbohydrates per serving.  The resulting number is the total amount of carbohydrates that you will be having.  Learning standard serving sizes of other foods When you eat foods containing carbohydrates that are not packaged or do not include "Nutrition Facts" on the label, you need to measure the servings in order to count the amount of carbohydrates:  Measure the foods that you will eat with a food  scale or measuring cup, if needed.  Decide how many standard-size servings you will eat.  Multiply the number of servings by 15. Most carbohydrate-rich foods have about 15 g of carbohydrates per serving. ? For example, if you eat 8 oz (170 g) of strawberries, you will have eaten 2 servings and 30 g of carbohydrates (2 servings x 15 g = 30 g).  For foods that have more than one food mixed, such as soups and casseroles, you must count the carbohydrates in each food that is included.  The following list contains standard serving sizes of common carbohydrate-rich foods. Each of these servings has about 15 g of carbohydrates:   hamburger bun or  English muffin.   oz (15 mL) syrup.   oz (14 g) jelly.  1 slice of bread.  1 six-inch tortilla.  3 oz (85 g) cooked rice or pasta.  4 oz (113 g) cooked dried beans.  4 oz (113 g) starchy vegetable, such as peas, corn, or potatoes.  4 oz (113 g) hot cereal.  4 oz (113 g) mashed potatoes or  of a large baked potato.  4 oz (113 g) canned or frozen fruit.  4 oz (120 mL) fruit juice.  4-6 crackers.  6 chicken nuggets.  6 oz (170 g) unsweetened dry cereal.  6 oz (170 g) plain fat-free yogurt or yogurt sweetened with artificial sweeteners.  8 oz (240 mL) milk.  8 oz (170 g) fresh fruit or one small piece of fruit.  24 oz (680 g) popped popcorn.  Example of carbohydrate counting Sample meal  3 oz (85 g) chicken  breast.  6 oz (170 g) brown rice.  4 oz (113 g) corn.  8 oz (240 mL) milk.  8 oz (170 g) strawberries with sugar-free whipped topping. Carbohydrate calculation 1. Identify the foods that contain carbohydrates: ? Rice. ? Corn. ? Milk. ? Strawberries. 2. Calculate how many servings you have of each food: ? 2 servings rice. ? 1 serving corn. ? 1 serving milk. ? 1 serving strawberries. 3. Multiply each number of servings by 15 g: ? 2 servings rice x 15 g = 30 g. ? 1 serving corn x 15 g = 15 g. ? 1 serving  milk x 15 g = 15 g. ? 1 serving strawberries x 15 g = 15 g. 4. Add together all of the amounts to find the total grams of carbohydrates eaten: ? 30 g + 15 g + 15 g + 15 g = 75 g of carbohydrates total. This information is not intended to replace advice given to you by your health care provider. Make sure you discuss any questions you have with your health care provider. Document Released: 07/15/2005 Document Revised: 02/02/2016 Document Reviewed: 12/27/2015 Elsevier Interactive Patient Education  [employer].

## 2017-07-15 NOTE — Progress Notes (Signed)
Subjective:  I acted as a Education administrator for [employer]. Beth Maldonado, [place]   Patient ID: Beth Maldonado, female    DOB: 10-01-1945, 71 y.o.   MRN: [id number]  Chief Complaint  Patient presents with  . Follow-up    HPI  Patient is in today for follow up visi and feels mostly well..  Notes her family has gone through a great deal of loss lately since August she has lost her husband her sister-in-law and her brothers critically ill.  Her sugars have spiked up somewhat but they are tolerable.  Patient Care Team: Mosie Lukes, MD as PCP - General (Family Medicine) Marin Olp Rudell Cobb, MD as Consulting Physician (Oncology) Gatha Mayer, MD as Consulting Physician (Gastroenterology) Calvert Cantor, MD as Consulting Physician (Ophthalmology) Alphonsa Overall, MD as Consulting Physician (General Surgery)   Past Medical History:  Diagnosis Date  . Acute bronchitis 03/29/2013  . Acute upper respiratory infection 11/27/2014  . Allergy   . Anemia 07/02/2015  . Arthritis   . Back pain    upper and lower last 20 years  . Cancer of left colon ([place]) 12/18/2015  . Depression with anxiety 06/27/2013  . Diabetes mellitus   . Hx of adenomatous colonic polyps 10/27/2015  . Hyperlipidemia   . Hypertension   . Insomnia 06/30/2015  . Medicare annual wellness visit, subsequent 03/23/2014  . Neck pain on left side 05/29/2014   hx of  . Neuromuscular disorder (HCC)    neuropathy feet  . Neuropathy   . Osteopenia   . Preventative health care 07/02/2015  . Tinnitus    RIGHT EAR ALL THE TIME    Past Surgical History:  Procedure Laterality Date  . BREAST ENHANCEMENT SURGERY Bilateral 1987   Reduction  . CATARACT EXTRACTION, BILATERAL  2011  . COLONOSCOPY  2017   colon cancer  . [place]  . KNEE ARTHROSCOPY Right 2003  . LAPAROSCOPIC SIGMOID COLECTOMY N/A 12/18/2015   Procedure: LAPAROSCOPIC SIGMOID COLECTOMY;  Surgeon: Alphonsa Overall, MD;  Location: [facility];  Service: General;  Laterality: N/A;  .  TUBAL LIGATION  1970  . VAGINAL HYSTERECTOMY  1974   partial    Family History  Problem Relation Age of Onset  . Diabetes Mother   . Heart attack Father   . Heart disease Father        76 and 55 MI  . Diabetes Sister   . Sickle cell anemia Sister   . Arthritis Sister   . Hypertension Sister   . Diabetes Sister   . Thyroid disease Sister   . Arthritis Sister   . Glaucoma Sister   . Colon cancer Neg Hx     Social History   Socioeconomic History  . Marital status: Married    Spouse name: Not on file  . Number of children: 1  . Years of education: Not on file  . Highest education level: Not on file  Social Needs  . Financial resource strain: Not on file  . Food insecurity - worry: Not on file  . Food insecurity - inability: Not on file  . Transportation needs - medical: Not on file  . Transportation needs - non-medical: Not on file  Occupational History  . Occupation: retired  Tobacco Use  . Smoking status: Former Smoker    Packs/day: 1.00    Years: 20.00    Pack years: 20.00    Types: Cigarettes    Start date: 10/03/1966    Last attempt to  quit: 11/03/1986    Years since quitting: 30.7  . Smokeless tobacco: Never Used  . Tobacco comment: quit 28 years ago  Substance and Sexual Activity  . Alcohol use: Yes    Alcohol/week: 0.0 oz    Comment: Rare- Special Occasional   . Drug use: No  . Sexual activity: Not on file    Comment: lives with husband, no dietary restrictions  Other Topics Concern  . Not on file  Social History Narrative  . Not on file    Outpatient Medications Prior to Visit  Medication Sig Dispense Refill  . aspirin EC 81 MG tablet Take 81 mg by mouth daily.    Marland Kitchen atorvastatin (LIPITOR) 10 MG tablet TAKE 1 TABLET BY MOUTH DAILY. 30 tablet 4  . cyclobenzaprine (FLEXERIL) 5 MG tablet TAKE 1 TABLET BY MOUTH ATBEDTIME AS NEEDED FOR MUSCLE SPASMS. 30 tablet 0  . escitalopram (LEXAPRO) 20 MG tablet TAKE 1 TABLET BY MOUTH  DAILY 30 tablet 0  .  gabapentin (NEURONTIN) 300 MG capsule TAKE TWO CAPSULES BY MOUTH THREE TIMES DAILY  180 capsule 0  . glucose blood (ONETOUCH VERIO) test strip Use four times daily to check blood sugar.  DX  E11.9 200 each 3  . hydrochlorothiazide (HYDRODIURIL) 25 MG tablet take 1 tablet by mouth once daily 30 tablet 0  . hydrOXYzine (ATARAX/VISTARIL) 25 MG tablet TAKE ONE TABLET BY MOUTH TWICE DAILY  60 tablet 0  . Insulin Pen Needle 32G X 4 MM MISC Use for insulin injection twice a day 250.00 100 each 2  . lidocaine (LIDODERM) 5 % Place 1 patch onto the skin daily. Remove & Discard patch within 12 hours or as directed by MD (Patient taking differently: Place 1 patch onto the skin daily as needed (For pain.). Remove & Discard patch within 12 hours or as directed by MD) 30 patch 0  . metFORMIN (GLUCOPHAGE) 500 MG tablet TAKE 2 TABLETS BY MOUTH TWICE A DAY WITH MEALS  120 tablet 0  . metFORMIN (GLUCOPHAGE) 500 MG tablet TAKE 2 TABLETS BY MOUTH TWICE A DAY 120 tablet 0  . Multiple Vitamin (MULTIVITAMIN) tablet Take 1 tablet by mouth daily.    [name] DELICA LANCETS 87O MISC Use as directed to check blood sugar 4 times per day dx code E11.9 200 each 3  . tiZANidine (ZANAFLEX) 4 MG tablet Take 1 tablet (4 mg total) by mouth every 8 (eight) hours as needed for muscle spasms. 60 tablet 01  . triamterene-hydrochlorothiazide (MAXZIDE-25) 37.5-25 MG tablet TAKE 1 TABLET BY MOUTH DAILY. 30 tablet 5  . HUMALOG KWIKPEN 100 UNIT/ML KiwkPen INJECT FIVE UNITS  SUBCUTANEOUSLY WITH  BREAKFAST AND DINNER AND 7  UNITS AT LUNCH 30 mL 2  . TOUJEO SOLOSTAR 300 UNIT/ML SOPN INJECT 22 UNITS INTO THE  SKIN DAILY. 4.5 mL 3   No facility-administered medications prior to visit.     Allergies  Allergen Reactions  . Penicillins Swelling and Other (See Comments)    tongue swelled per pt Has patient had a PCN reaction causing immediate rash, facial/tongue/throat swelling, SOB or lightheadedness with hypotension: yes Has patient had a PCN  reaction causing severe rash involving mucus membranes or skin necrosis: no Has patient had a PCN reaction that required hospitalization no  Has patient had a PCN reaction occurring within the last 10 years: unsure If all of the above answers are "NO", then may proceed with Cephalosporin use.     Review of Systems  Constitutional: Negative for  chills, fever and malaise/fatigue.  HENT: Negative for congestion and hearing loss.   Eyes: Negative for discharge.  Respiratory: Negative for cough, sputum production and shortness of breath.   Cardiovascular: Negative for chest pain, palpitations and leg swelling.  Gastrointestinal: Positive for nausea and vomiting. Negative for abdominal pain, blood in stool, constipation, diarrhea and heartburn.  Genitourinary: Negative for dysuria, frequency, hematuria and urgency.  Musculoskeletal: Negative for back pain, falls and myalgias.  Skin: Negative for rash.  Neurological: Negative for dizziness, sensory change, loss of consciousness, weakness and headaches.  Endo/Heme/Allergies: Negative for environmental allergies. Does not bruise/bleed easily.  Psychiatric/Behavioral: Positive for depression. Negative for suicidal ideas. The patient is nervous/anxious. The patient does not have insomnia.        Objective:    Physical Exam  Constitutional: She is oriented to person, place, and time. She appears well-developed and well-nourished. No distress.  HENT:  Head: Normocephalic and atraumatic.  Nose: Nose normal.  Eyes: Right eye exhibits no discharge. Left eye exhibits no discharge.  Neck: Normal range of motion. Neck supple.  Cardiovascular: Normal rate and regular rhythm.  Murmur heard. Pulmonary/Chest: Effort normal and breath sounds normal.  Abdominal: Soft. Bowel sounds are normal. There is no tenderness.  Musculoskeletal: She exhibits no edema.  Neurological: She is alert and oriented to person, place, and time.  Skin: Skin is warm and dry.    Psychiatric: She has a normal mood and affect.  Nursing note and vitals reviewed.   BP 130/65 (BP Location: Left Arm, Patient Position: Sitting, Cuff Size: Normal)   Pulse 79   Temp (!) 97.5 F (36.4 C) (Oral)   Resp 16   Ht 5' 1.81" (1.57 m)   Wt 174 lb (78.9 kg)   SpO2 97%   BMI 32.02 kg/m  Wt Readings from Last 3 Encounters:  07/15/17 174 lb (78.9 kg)  12/05/16 185 lb 9.6 oz (84.2 kg)  10/30/16 185 lb 12.8 oz (84.3 kg)   BP Readings from Last 3 Encounters:  07/15/17 130/65  12/05/16 127/61  10/30/16 135/66     Immunization History  Administered Date(s) Administered  . Influenza Split 05/21/2011, 06/18/2012  . Influenza Whole 05/12/2013  . Influenza, High Dose Seasonal PF 04/23/2016  . Influenza,inj,Quad PF,6+ Mos 03/21/2014  . Influenza-Unspecified 04/29/2015  . Pneumococcal Conjugate-13 03/21/2014  . Pneumococcal Polysaccharide-23 05/21/2011    Health Maintenance  Topic Date Due  . FOOT EXAM  07/09/2016  . URINE MICROALBUMIN  08/01/2017  . COLONOSCOPY  10/16/2017  . HEMOGLOBIN A1C  11/27/2017  . OPHTHALMOLOGY EXAM  05/22/2018  . MAMMOGRAM  05/28/2019  . TETANUS/TDAP  08/08/2019  . INFLUENZA VACCINE  Completed  . DEXA SCAN  Completed  . Hepatitis C Screening  Completed  . PNA vac Low Risk Adult  Completed    Lab Results  Component Value Date   WBC 7.9 05/30/2017   HGB 10.6 (L) 05/30/2017   HCT 32.7 (L) 05/30/2017   PLT 589.0 (H) 05/30/2017   GLUCOSE 120 (H) 05/30/2017   CHOL 126 05/30/2017   TRIG 103.0 05/30/2017   HDL 51.20 05/30/2017   LDLDIRECT 109.0 09/20/2015   LDLCALC 54 05/30/2017   ALT 13 05/30/2017   AST 13 05/30/2017   NA 140 05/30/2017   K 3.7 05/30/2017   CL 103 05/30/2017   CREATININE 1.54 (H) 05/30/2017   BUN 19 05/30/2017   CO2 28 05/30/2017   TSH 2.11 05/30/2017   HGBA1C 8.6 (H) 05/30/2017   MICROALBUR 0.9 08/01/2016  Lab Results  Component Value Date   TSH 2.11 05/30/2017   Lab Results  Component Value Date   WBC  7.9 05/30/2017   HGB 10.6 (L) 05/30/2017   HCT 32.7 (L) 05/30/2017   MCV 89.9 05/30/2017   PLT 589.0 (H) 05/30/2017   Lab Results  Component Value Date   NA 140 05/30/2017   K 3.7 05/30/2017   CHLORIDE 103 10/30/2016   CO2 28 05/30/2017   GLUCOSE 120 (H) 05/30/2017   BUN 19 05/30/2017   CREATININE 1.54 (H) 05/30/2017   BILITOT 0.7 05/30/2017   ALKPHOS 56 05/30/2017   AST 13 05/30/2017   ALT 13 05/30/2017   PROT 7.3 05/30/2017   ALBUMIN 4.1 05/30/2017   CALCIUM 9.3 05/30/2017   ANIONGAP 11 10/30/2016   EGFR 44 (L) 10/30/2016   GFR 42.66 (L) 05/30/2017   Lab Results  Component Value Date   CHOL 126 05/30/2017   Lab Results  Component Value Date   HDL 51.20 05/30/2017   Lab Results  Component Value Date   LDLCALC 54 05/30/2017   Lab Results  Component Value Date   TRIG 103.0 05/30/2017   Lab Results  Component Value Date   CHOLHDL 2 05/30/2017   Lab Results  Component Value Date   HGBA1C 8.6 (H) 05/30/2017         Assessment & Plan:   Problem List Items Addressed This Visit    Diabetes mellitus ([place])    hgba1c acceptable, minimize simple carbs. Increase exercise as tolerated. Continue current meds      Relevant Medications   insulin lispro (HUMALOG KWIKPEN) 100 UNIT/ML KiwkPen   Insulin Glargine (TOUJEO SOLOSTAR) 300 UNIT/ML SOPN   Hyperlipidemia    Encouraged heart healthy diet, increase exercise, avoid trans fats, consider a krill oil cap daily      Depression with anxiety    Has back pain tied to anxiety attacks encouraged to Diandre Mer[place], get adqeuate sleep         I have changed Barbarann Ehlers Straughter's HUMALOG KWIKPEN to insulin lispro and TOUJEO SOLOSTAR to Insulin Glargine. I am also having her maintain her Insulin Pen Needle, multivitamin, lidocaine, aspirin EC, cyclobenzaprine, ONETOUCH DELICA LANCETS 37Q, tiZANidine, glucose blood, triamterene-hydrochlorothiazide, atorvastatin, escitalopram, metFORMIN, metFORMIN, gabapentin, hydrOXYzine, and  hydrochlorothiazide.  Meds ordered this encounter  Medications  . insulin lispro (HUMALOG KWIKPEN) 100 UNIT/ML KiwkPen    Sig: Inject 0.07 mLs (7 Units total) into the skin 3 (three) times daily.    Dispense:  30 mL    Refill:  2  . Insulin Glargine (TOUJEO SOLOSTAR) 300 UNIT/ML SOPN    Sig: Inject 22 Units into the skin daily.    Dispense:  4.5 mL    Refill:  3    .Penni Homans, MD

## 2017-07-16 ENCOUNTER — Telehealth: Payer: Self-pay | Admitting: Family Medicine

## 2017-07-16 NOTE — Assessment & Plan Note (Signed)
Encouraged heart healthy diet, increase exercise, avoid trans fats, consider a krill oil cap daily 

## 2017-07-16 NOTE — Assessment & Plan Note (Signed)
hgba1c acceptable, minimize simple carbs. Increase exercise as tolerated. Continue current meds 

## 2017-07-16 NOTE — Assessment & Plan Note (Signed)
Has back pain tied to anxiety attacks encouraged to stacey, get adqeuate sleep

## 2017-07-16 NOTE — Telephone Encounter (Signed)
Copied from [place]. Topic: Quick Communication - See Telephone Encounter >> Jul 16, 2017  1:26 PM Burnis Medin, NT wrote: CRM for notification. See Telephone encounter for: Pt is calling to give the nurse Tobie Poet the name of the medication that is not on her Med List. Medication is Diclofenac Potassium.  Pt takes 1 tablet by mouth 2 times a day but only takes medication when the pain is bad. Pt said she couldn't remember the doctor's name who prescribed it to her.   07/16/17.

## 2017-07-30 ENCOUNTER — Telehealth: Payer: Self-pay | Admitting: *Deleted

## 2017-07-30 NOTE — Telephone Encounter (Signed)
Received Mammography results from [place] recommending further Imaging evaluation; forwarded to provider/SLS 01/02

## 2017-07-31 ENCOUNTER — Other Ambulatory Visit: Payer: Self-pay | Admitting: Family Medicine

## 2017-08-07 ENCOUNTER — Encounter: Payer: Self-pay | Admitting: Family Medicine

## 2017-08-20 ENCOUNTER — Other Ambulatory Visit: Payer: Self-pay | Admitting: Family Medicine

## 2017-08-28 ENCOUNTER — Other Ambulatory Visit: Payer: Self-pay | Admitting: Family Medicine

## 2017-09-02 ENCOUNTER — Other Ambulatory Visit: Payer: Self-pay | Admitting: Family Medicine

## 2017-09-04 DIAGNOSIS — C186 Malignant neoplasm of descending colon: Secondary | ICD-10-CM | POA: Diagnosis not present

## 2017-09-04 DIAGNOSIS — E109 Type 1 diabetes mellitus without complications: Secondary | ICD-10-CM | POA: Diagnosis not present

## 2017-09-09 ENCOUNTER — Other Ambulatory Visit (INDEPENDENT_AMBULATORY_CARE_PROVIDER_SITE_OTHER): Payer: MEDICARE

## 2017-09-09 ENCOUNTER — Other Ambulatory Visit: Payer: Self-pay | Admitting: Emergency Medicine

## 2017-09-09 DIAGNOSIS — E083299 Diabetes mellitus due to underlying condition with mild nonproliferative diabetic retinopathy without macular edema, unspecified eye: Secondary | ICD-10-CM

## 2017-09-09 DIAGNOSIS — I10 Essential (primary) hypertension: Secondary | ICD-10-CM | POA: Diagnosis not present

## 2017-09-09 DIAGNOSIS — E782 Mixed hyperlipidemia: Secondary | ICD-10-CM

## 2017-09-09 LAB — COMPREHENSIVE METABOLIC PANEL
ALT: 17 U/L (ref 0–35)
AST: 14 U/L (ref 0–37)
Albumin: 4.2 g/dL (ref 3.5–5.2)
Alkaline Phosphatase: 62 U/L (ref 39–117)
BILIRUBIN TOTAL: 0.8 mg/dL (ref 0.2–1.2)
BUN: 14 mg/dL (ref 6–23)
CALCIUM: 9.5 mg/dL (ref 8.4–10.5)
CO2: 27 meq/L (ref 19–32)
Chloride: 102 mEq/L (ref 96–112)
Creatinine, Ser: 1.39 mg/dL — ABNORMAL HIGH (ref 0.40–1.20)
GFR: 47.98 mL/min — ABNORMAL LOW (ref >60.00)
GLUCOSE: 202 mg/dL — AB (ref 70–99)
Potassium: 3.8 mEq/L (ref 3.5–5.1)
Sodium: 140 mEq/L (ref 135–145)
Total Protein: 7.3 g/dL (ref 6.0–8.3)

## 2017-09-09 LAB — CBC
HCT: 35.7 % — ABNORMAL LOW (ref 36.0–46.0)
Hemoglobin: 11.8 g/dL — ABNORMAL LOW (ref 12.0–15.0)
MCHC: 32.9 g/dL (ref 30.0–36.0)
MCV: 89.2 fl (ref 78.0–100.0)
Platelets: 576 10*3/uL — ABNORMAL HIGH (ref 150.0–400.0)
RBC: 4.01 Mil/uL (ref 3.87–5.11)
RDW: 14.3 % (ref 11.5–15.5)
WBC: 9 10*3/uL (ref 4.0–10.5)

## 2017-09-09 LAB — LIPID PANEL
Cholesterol: 134 mg/dL (ref 0–200)
HDL: 43.2 mg/dL (ref >39.00)
LDL Cholesterol: 65 mg/dL (ref 0–99)
NONHDL: 91.16
Total CHOL/HDL Ratio: 3
Triglycerides: 131 mg/dL (ref 0.0–149.0)
VLDL: 26.2 mg/dL (ref 0.0–40.0)

## 2017-09-09 LAB — TSH: TSH: 2.5 u[IU]/mL (ref 0.35–4.50)

## 2017-09-09 LAB — HEMOGLOBIN A1C: HEMOGLOBIN A1C: 9.4 % — AB (ref 4.6–6.5)

## 2017-09-16 ENCOUNTER — Ambulatory Visit (INDEPENDENT_AMBULATORY_CARE_PROVIDER_SITE_OTHER): Payer: MEDICARE | Admitting: Family Medicine

## 2017-09-16 ENCOUNTER — Encounter: Payer: Self-pay | Admitting: Family Medicine

## 2017-09-16 DIAGNOSIS — E083299 Diabetes mellitus due to underlying condition with mild nonproliferative diabetic retinopathy without macular edema, unspecified eye: Secondary | ICD-10-CM | POA: Diagnosis not present

## 2017-09-16 DIAGNOSIS — D508 Other iron deficiency anemias: Secondary | ICD-10-CM

## 2017-09-16 DIAGNOSIS — I10 Essential (primary) hypertension: Secondary | ICD-10-CM | POA: Diagnosis not present

## 2017-09-16 DIAGNOSIS — E6609 Other obesity due to excess calories: Secondary | ICD-10-CM

## 2017-09-16 DIAGNOSIS — E782 Mixed hyperlipidemia: Secondary | ICD-10-CM | POA: Diagnosis not present

## 2017-09-16 MED ORDER — INSULIN GLARGINE 300 UNIT/ML ~~LOC~~ SOPN: 26.0000 [IU] | PEN_INJECTOR | Freq: Every day | SUBCUTANEOUS | 3 refills | Status: DC

## 2017-09-16 MED ORDER — FREESTYLE LIBRE 14 DAY READER DEVI: 1.0000 | Freq: Three times a day (TID) | 0 refills | Status: DC | PRN

## 2017-09-16 NOTE — Assessment & Plan Note (Signed)
Well controlled, no changes to meds. Encouraged heart healthy diet such as the DASH diet and exercise as tolerated.  °

## 2017-09-16 NOTE — Progress Notes (Signed)
Subjective:  I acted as a Education administrator for Dr. Charlett Blake. [name], [place]  Patient ID: Beth Maldonado, female    DOB: [birth year], 72 y.o.   MRN: [id number]  No chief complaint on file.   HPI  Patient is in today for a 2 month follow up and overall she feels well. She is improving slowly since her GI surgery. No recent febrile illness or hospitalizations. She is trying to maintain a heart healthy diet when she can eat. Denies CP/palp/SOB/HA/congestion/fevers/GI or GU c/o. Taking meds as prescribed  Patient Care Team: Mosie Lukes, MD as PCP - General (Family Medicine) Marin Olp Rudell Cobb, MD as Consulting Physician (Oncology) Gatha Mayer, MD as Consulting Physician (Gastroenterology) Calvert Cantor, MD as Consulting Physician (Ophthalmology) Alphonsa Overall, MD as Consulting Physician (General Surgery)   Past Medical History:  Diagnosis Date  . Acute bronchitis 03/29/2013  . Acute upper respiratory infection 11/27/2014  . Allergy   . Anemia 07/02/2015  . Arthritis   . Back pain    upper and lower last 20 years  . Cancer of left colon ([facility]) 12/18/2015  . Depression with anxiety 06/27/2013  . Diabetes mellitus   . Hx of adenomatous colonic polyps 10/27/2015  . Hyperlipidemia   . Hypertension   . Insomnia 06/30/2015  . Medicare annual wellness visit, subsequent 03/23/2014  . Neck pain on left side 05/29/2014   hx of  . Neuromuscular disorder (H[facility])    neuropathy feet  . Neuropathy   . Osteopenia   . Preventative health care 07/02/2015  . Tinnitus    RIGHT EAR ALL THE TIME    Past Surgical History:  Procedure Laterality Date  . BREAST ENHANCEMENT SURGERY Bilateral 1987   Reduction  . CATARACT EXTRACTION, BILATERAL  2011  . COLONOSCOPY  2017   colon cancer  . [place]  . KNEE ARTHROSCOPY Right 2003  . LAPAROSCOPIC SIGMOID COLECTOMY N/A 12/18/2015   Procedure: LAPAROSCOPIC SIGMOID COLECTOMY;  Surgeon: Alphonsa Overall, MD;  Location: [facility];  Service: General;  Laterality:  N/A;  . TUBAL LIGATION  1970  . VAGINAL HYSTERECTOMY  1974   partial    Family History  Problem Relation Age of Onset  . Diabetes Mother   . Heart attack Father   . Heart disease Father        53 and 41 MI  . Diabetes Sister   . Sickle cell anemia Sister   . Arthritis Sister   . Hypertension Sister   . Diabetes Sister   . Thyroid disease Sister   . Arthritis Sister   . Glaucoma Sister   . Colon cancer Neg Hx     Social History   Socioeconomic History  . Marital status: Married    Spouse name: Not on file  . Number of children: 1  . Years of education: Not on file  . Highest education level: Not on file  Social Needs  . Financial resource strain: Not on file  . Food insecurity - worry: Not on file  . Food insecurity - inability: Not on file  . Transportation needs - medical: Not on file  . Transportation needs - non-medical: Not on file  Occupational History  . Occupation: retired  Tobacco Use  . Smoking status: Former Smoker    Packs/day: 1.00    Years: 20.00    Pack years: 20.00    Types: Cigarettes    Start date: 10/03/1966    Last attempt to quit: 11/03/1986  Years since quitting: 30.8  . Smokeless tobacco: Never Used  . Tobacco comment: quit 28 years ago  Substance and Sexual Activity  . Alcohol use: Yes    Alcohol/week: 0.0 oz    Comment: Rare- Special Occasional   . Drug use: No  . Sexual activity: Not on file    Comment: lives with husband, no dietary restrictions  Other Topics Concern  . Not on file  Social History Narrative  . Not on file    Outpatient Medications Prior to Visit  Medication Sig Dispense Refill  . aspirin EC 81 MG tablet Take 81 mg by mouth daily.    Marland Kitchen atorvastatin (LIPITOR) 10 MG tablet TAKE ONE TABLET BY MOUTH ONE TIME DAILY  30 tablet 3  . cyclobenzaprine (FLEXERIL) 5 MG tablet TAKE 1 TABLET BY MOUTH ATBEDTIME AS NEEDED FOR MUSCLE SPASMS. 30 tablet 0  . diclofenac (CATAFLAM) 50 MG tablet Take 50 mg by mouth 2 (two) times  daily.    Marland Kitchen escitalopram (LEXAPRO) 20 MG tablet TAKE 1 TABLET BY MOUTH  DAILY 30 tablet 0  . gabapentin (NEURONTIN) 300 MG capsule TAKE TWO CAPSULES BY MOUTH THREE TIMES DAILY  180 capsule 0  . glucose blood (ONETOUCH VERIO) test strip Use four times daily to check blood sugar.  DX  E11.9 200 each 3  . hydrochlorothiazide (HYDRODIURIL) 25 MG tablet TAKE ONE TABLET BY MOUTH ONE TIME DAILY  30 tablet 0  . hydrOXYzine (ATARAX/VISTARIL) 25 MG tablet TAKE ONE TABLET BY MOUTH TWICE DAILY  60 tablet 0  . Insulin Pen Needle 32G X 4 MM MISC Use for insulin injection twice a day 250.00 100 each 2  . lidocaine (LIDODERM) 5 % Place 1 patch onto the skin daily. Remove & Discard patch within 12 hours or as directed by MD (Patient taking differently: Place 1 patch onto the skin daily as needed (For pain.). Remove & Discard patch within 12 hours or as directed by MD) 30 patch 0  . metFORMIN (GLUCOPHAGE) 500 MG tablet TAKE TWO TABLETS BY MOUTH TWICE DAILY  120 tablet 0  . Multiple Vitamin (MULTIVITAMIN) tablet Take 1 tablet by mouth daily.    [name] DELICA LANCETS 71G MISC Use as directed to check blood sugar 4 times per day dx code E11.9 200 each 3  . triamterene-hydrochlorothiazide (MAXZIDE-25) 37.5-25 MG tablet TAKE 1 TABLET BY MOUTH DAILY. 30 tablet 5  . Insulin Glargine (TOUJEO SOLOSTAR) 300 UNIT/ML SOPN Inject 22 Units into the skin daily. 4.5 mL 3  . insulin lispro (HUMALOG KWIKPEN) 100 UNIT/ML KiwkPen Inject 0.07 mLs (7 Units total) into the skin 3 (three) times daily. 30 mL 2  . metFORMIN (GLUCOPHAGE) 500 MG tablet TAKE 2 TABLETS BY MOUTH TWICE A DAY WITH MEALS  120 tablet 0  . tiZANidine (ZANAFLEX) 4 MG tablet Take 1 tablet (4 mg total) by mouth every 8 (eight) hours as needed for muscle spasms. 60 tablet 01   No facility-administered medications prior to visit.     Allergies  Allergen Reactions  . Penicillins Swelling and Other (See Comments)    tongue swelled per pt Has patient had a PCN  reaction causing immediate rash, facial/tongue/throat swelling, SOB or lightheadedness with hypotension: yes Has patient had a PCN reaction causing severe rash involving mucus membranes or skin necrosis: no Has patient had a PCN reaction that required hospitalization no  Has patient had a PCN reaction occurring within the last 10 years: unsure If all of the above answers are "  NO", then may proceed with Cephalosporin use.     Review of Systems  Constitutional: Negative for fever and malaise/fatigue.  HENT: Negative for congestion.   Eyes: Negative for blurred vision.  Respiratory: Negative for shortness of breath.   Cardiovascular: Negative for chest pain, palpitations and leg swelling.  Gastrointestinal: Negative for abdominal pain, blood in stool and nausea.  Genitourinary: Negative for dysuria and frequency.  Musculoskeletal: Negative for falls.  Skin: Negative for rash.  Neurological: Negative for dizziness, loss of consciousness and headaches.  Endo/Heme/Allergies: Negative for environmental allergies.  Psychiatric/Behavioral: Negative for depression. The patient is not nervous/anxious.        Objective:    Physical Exam  Constitutional: She is oriented to person, place, and time. She appears well-developed and well-nourished. No distress.  HENT:  Head: Normocephalic and atraumatic.  Nose: Nose normal.  Eyes: Right eye exhibits no discharge. Left eye exhibits no discharge.  Neck: Normal range of motion. Neck supple.  Cardiovascular: Normal rate and regular rhythm.  No murmur heard. Pulmonary/Chest: Effort normal and breath sounds normal.  Abdominal: Soft. Bowel sounds are normal. There is no tenderness.  Musculoskeletal: She exhibits no edema.  Neurological: She is alert and oriented to person, place, and time.  Skin: Skin is warm and dry.  Psychiatric: She has a normal mood and affect.  Nursing note and vitals reviewed.   BP (!) 144/62 (BP Location: Left Arm, Patient  Position: Sitting, Cuff Size: Normal)   Pulse 88   Temp 98 F (36.7 C) (Oral)   Resp 18   Wt 173 lb 9.6 oz (78.7 kg)   SpO2 98%   BMI 31.95 kg/m  Wt Readings from Last 3 Encounters:  09/16/17 173 lb 9.6 oz (78.7 kg)  07/15/17 174 lb (78.9 kg)  12/05/16 185 lb 9.6 oz (84.2 kg)   BP Readings from Last 3 Encounters:  09/16/17 (!) 144/62  07/15/17 130/65  12/05/16 127/61     Immunization History  Administered Date(s) Administered  . Influenza Split 05/21/2011, 06/18/2012  . Influenza Whole 05/12/2013  . Influenza, High Dose Seasonal PF 04/23/2016  . Influenza,inj,Quad PF,6+ Mos 03/21/2014  . Influenza-Unspecified 04/29/2015  . Pneumococcal Conjugate-13 03/21/2014  . Pneumococcal Polysaccharide-23 05/21/2011    Health Maintenance  Topic Date Due  . FOOT EXAM  07/09/2016  . URINE MICROALBUMIN  08/01/2017  . COLONOSCOPY  10/16/2017  . HEMOGLOBIN A1C  03/09/2018  . OPHTHALMOLOGY EXAM  05/22/2018  . MAMMOGRAM  05/28/2019  . TETANUS/TDAP  08/08/2019  . INFLUENZA VACCINE  Completed  . DEXA SCAN  Completed  . Hepatitis C Screening  Completed  . PNA vac Low Risk Adult  Completed    Lab Results  Component Value Date   WBC 9.0 09/09/2017   HGB 11.8 (L) 09/09/2017   HCT 35.7 (L) 09/09/2017   PLT 576.0 (H) 09/09/2017   GLUCOSE 202 (H) 09/09/2017   CHOL 134 09/09/2017   TRIG 131.0 09/09/2017   HDL 43.20 09/09/2017   LDLDIRECT 109.0 09/20/2015   LDLCALC 65 09/09/2017   ALT 17 09/09/2017   AST 14 09/09/2017   NA 140 09/09/2017   K 3.8 09/09/2017   CL 102 09/09/2017   CREATININE 1.39 (H) 09/09/2017   BUN 14 09/09/2017   CO2 27 09/09/2017   TSH 2.50 09/09/2017   HGBA1C 9.4 (H) 09/09/2017   MICROALBUR 0.9 08/01/2016    Lab Results  Component Value Date   TSH 2.50 09/09/2017   Lab Results  Component Value Date  WBC 9.0 09/09/2017   HGB 11.8 (L) 09/09/2017   HCT 35.7 (L) 09/09/2017   MCV 89.2 09/09/2017   PLT 576.0 (H) 09/09/2017   Lab Results  Component  Value Date   NA 140 09/09/2017   K 3.8 09/09/2017   CHLORIDE 103 10/30/2016   CO2 27 09/09/2017   GLUCOSE 202 (H) 09/09/2017   BUN 14 09/09/2017   CREATININE 1.39 (H) 09/09/2017   BILITOT 0.8 09/09/2017   ALKPHOS 62 09/09/2017   AST 14 09/09/2017   ALT 17 09/09/2017   PROT 7.3 09/09/2017   ALBUMIN 4.2 09/09/2017   CALCIUM 9.5 09/09/2017   ANIONGAP 11 10/30/2016   EGFR 44 (L) 10/30/2016   GFR 47.98 (L) 09/09/2017   Lab Results  Component Value Date   CHOL 134 09/09/2017   Lab Results  Component Value Date   HDL 43.20 09/09/2017   Lab Results  Component Value Date   LDLCALC 65 09/09/2017   Lab Results  Component Value Date   TRIG 131.0 09/09/2017   Lab Results  Component Value Date   CHOLHDL 3 09/09/2017   Lab Results  Component Value Date   HGBA1C 9.4 (H) 09/09/2017         Assessment & Plan:   Problem List Items Addressed This Visit    Diabetes mellitus ([place])    hgba1c unacceptable, minimize simple carbs. Increase exercise as tolerated. Continue current meds but she has not picked up her Humalog so she agrees to restart it. Her sugars have ranged from 60 to 260 this month. Has been on Tuojeo 24 will increase to 2 units and start the Humalog      Relevant Medications   Insulin Glargine (TOUJEO SOLOSTAR) 300 UNIT/ML SOPN   Continuous Blood Gluc Receiver (FREESTYLE LIBRE 14 DAY READER) DEVI   Other Relevant Orders   Hemoglobin A1c   Hypertension    Well controlled, no changes to meds. Encouraged heart healthy diet such as the DASH diet and exercise as tolerated.       Relevant Orders   CBC   Comprehensive metabolic panel   TSH   Hyperlipidemia    Encouraged heart healthy diet, increase exercise, avoid trans fats, consider a krill oil cap daily      Relevant Orders   Lipid panel   Obesity    Encouraged DASH diet, decrease po intake and increase exercise as tolerated. Needs 7-8 hours of sleep nightly. Avoid trans fats, eat small, frequent meals  every 4-5 hours with lean proteins, complex carbs and healthy fats. Minimize simple carbs. Good recent weight loss      Relevant Medications   Insulin Glargine (TOUJEO SOLOSTAR) 300 UNIT/ML SOPN   Anemia    Increase leafy greens, consider increased lean red meat and using cast iron cookware. Continue to monitor, report any concerns         I have discontinued Barbarann Ehlers Strehlow's tiZANidine and insulin lispro. I have also changed her Insulin Glargine. Additionally, I am having her start on FREESTYLE LIBRE 14 DAY READER. Lastly, I am having her maintain her Insulin Pen Needle, multivitamin, lidocaine, aspirin EC, cyclobenzaprine, ONETOUCH DELICA LANCETS 94T, glucose blood, triamterene-hydrochlorothiazide, diclofenac, escitalopram, atorvastatin, metFORMIN, hydrochlorothiazide, hydrOXYzine, and gabapentin.  Meds ordered this encounter  Medications  . Insulin Glargine (TOUJEO SOLOSTAR) 300 UNIT/ML SOPN    Sig: Inject 26 Units into the skin daily.    Dispense:  4.5 mL    Refill:  3  . Continuous Blood Gluc Receiver (FREESTYLE LIBRE 14  DAY READER) DEVI    Sig: 1 Device by Does not apply route 3 (three) times daily as needed.    Dispense:  1 Device    Refill:  0    CMA served as Education administrator during this visit. History, Physical and Plan performed by medical provider. Documentation and orders reviewed and attested to.  Penni Homans, MD

## 2017-09-16 NOTE — Patient Instructions (Addendum)
Consider adding Benefiber to your day each day Carbohydrate Counting for Diabetes Mellitus, Adult Carbohydrate counting is a method for keeping track of how many carbohydrates you eat. Eating carbohydrates naturally increases the amount of sugar (glucose) in the blood. Counting how many carbohydrates you eat helps keep your blood glucose within normal limits, which helps you manage your diabetes (diabetes mellitus). It is important to know how many carbohydrates you can safely have in each meal. This is different for every person. A diet and nutrition specialist (registered dietitian) can help you make a meal plan and calculate how many carbohydrates you should have at each meal and snack. Carbohydrates are found in the following foods:  Grains, such as breads and cereals.  Dried beans and soy products.  Starchy vegetables, such as potatoes, peas, and corn.  Fruit and fruit juices.  Milk and yogurt.  Sweets and snack foods, such as cake, cookies, candy, chips, and soft drinks.  How do I count carbohydrates? There are two ways to count carbohydrates in food. You can use either of the methods or a combination of both. Reading "Nutrition Facts" on packaged food The "Nutrition Facts" list is included on the labels of almost all packaged foods and beverages in the [place] It includes:  The serving size.  Information about nutrients in each serving, including the grams (g) of carbohydrate per serving.  To use the "Nutrition Facts":  Decide how many servings you will have.  Multiply the number of servings by the number of carbohydrates per serving.  The resulting number is the total amount of carbohydrates that you will be having.  Learning standard serving sizes of other foods When you eat foods containing carbohydrates that are not packaged or do not include "Nutrition Facts" on the label, you need to measure the servings in order to count the amount of carbohydrates:  Measure the foods  that you will eat with a food scale or measuring cup, if needed.  Decide how many standard-size servings you will eat.  Multiply the number of servings by 15. Most carbohydrate-rich foods have about 15 g of carbohydrates per serving. ? For example, if you eat 8 oz (170 g) of strawberries, you will have eaten 2 servings and 30 g of carbohydrates (2 servings x 15 g = 30 g).  For foods that have more than one food mixed, such as soups and casseroles, you must count the carbohydrates in each food that is included.  The following list contains standard serving sizes of common carbohydrate-rich foods. Each of these servings has about 15 g of carbohydrates:   hamburger bun or  English muffin.   oz (15 mL) syrup.   oz (14 g) jelly.  1 slice of bread.  1 six-inch tortilla.  3 oz (85 g) cooked rice or pasta.  4 oz (113 g) cooked dried beans.  4 oz (113 g) starchy vegetable, such as peas, corn, or potatoes.  4 oz (113 g) hot cereal.  4 oz (113 g) mashed potatoes or  of a large baked potato.  4 oz (113 g) canned or frozen fruit.  4 oz (120 mL) fruit juice.  4-6 crackers.  6 chicken nuggets.  6 oz (170 g) unsweetened dry cereal.  6 oz (170 g) plain fat-free yogurt or yogurt sweetened with artificial sweeteners.  8 oz (240 mL) milk.  8 oz (170 g) fresh fruit or one small piece of fruit.  24 oz (680 g) popped popcorn.  Example of carbohydrate counting Sample  meal  3 oz (85 g) chicken breast.  6 oz (170 g) brown rice.  4 oz (113 g) corn.  8 oz (240 mL) milk.  8 oz (170 g) strawberries with sugar-free whipped topping. Carbohydrate calculation 1. Identify the foods that contain carbohydrates: ? Rice. ? Corn. ? Milk. ? Strawberries. 2. Calculate how many servings you have of each food: ? 2 servings rice. ? 1 serving corn. ? 1 serving milk. ? 1 serving strawberries. 3. Multiply each number of servings by 15 g: ? 2 servings rice x 15 g = 30 g. ? 1 serving  corn x 15 g = 15 g. ? 1 serving milk x 15 g = 15 g. ? 1 serving strawberries x 15 g = 15 g. 4. Add together all of the amounts to find the total grams of carbohydrates eaten: ? 30 g + 15 g + 15 g + 15 g = 75 g of carbohydrates total. This information is not intended to replace advice given to you by your health care provider. Make sure you discuss any questions you have with your health care provider. Document Released: 07/15/2005 Document Revised: 02/02/2016 Document Reviewed: 12/27/2015 Elsevier Interactive Patient Education  [employer].

## 2017-09-16 NOTE — Assessment & Plan Note (Signed)
Encouraged heart healthy diet, increase exercise, avoid trans fats, consider a krill oil cap daily 

## 2017-09-16 NOTE — Assessment & Plan Note (Signed)
Encouraged DASH diet, decrease po intake and increase exercise as tolerated. Needs 7-8 hours of sleep nightly. Avoid trans fats, eat small, frequent meals every 4-5 hours with lean proteins, complex carbs and healthy fats. Minimize simple carbs. Good recent weight loss

## 2017-09-16 NOTE — Assessment & Plan Note (Signed)
hgba1c unacceptable, minimize simple carbs. Increase exercise as tolerated. Continue current meds but she has not picked up her Humalog so she agrees to restart it. Her sugars have ranged from 60 to 260 this month. Has been on Tuojeo 24 will increase to 2 units and start the Humalog

## 2017-09-17 NOTE — Assessment & Plan Note (Signed)
Increase leafy greens, consider increased lean red meat and using cast iron cookware. Continue to monitor, report any concerns 

## 2017-09-23 ENCOUNTER — Telehealth: Payer: Self-pay | Admitting: Family Medicine

## 2017-09-23 NOTE — Telephone Encounter (Signed)
Copied from [place] [phone]. Topic: Quick Communication - Rx Refill/Question >> Sep 23, 2017 10:24 AM Celedonio Savage L wrote: Medication: senor pack and sensor applicator patient has the free style meter but it comes in 2 parts patient need these two parts   Has the patient contacted their pharmacy? Yes.     (Agent: If no, request that the patient contact the pharmacy for the refill.)   Preferred Pharmacy (with phone number or street name): [facility] # [id number] [place] [phone] (Phone) [phone] (Fax)     Agent: Please be advised that RX refills may take up to 3 business days. We ask that you follow-up with your pharmacy.

## 2017-09-24 MED ORDER — FREESTYLE LIBRE 14 DAY SENSOR MISC: 1.0000 | 5 refills | Status: DC

## 2017-09-24 NOTE — Telephone Encounter (Signed)
Rx was previously sent for the reader. Sent rx for sensor pack. Detailed message left on pt's home # re: Rx completion and to call if any further concern.

## 2017-10-02 ENCOUNTER — Telehealth: Payer: Self-pay

## 2017-10-02 NOTE — Telephone Encounter (Signed)
Received PA request for [facility] glucometer system- however Pt is not considered insulin dependent (she does not have an insulin pump, is not type 1 diabetic, and/or does not take insulin injections 3 or more times daily). Disregarding PA as Medicare will not cover.

## 2017-10-04 ENCOUNTER — Other Ambulatory Visit: Payer: Self-pay | Admitting: Family Medicine

## 2017-10-06 ENCOUNTER — Telehealth: Payer: Self-pay | Admitting: Family Medicine

## 2017-10-06 NOTE — Telephone Encounter (Signed)
LOV: 09/16/17  Dr. [facility]

## 2017-10-06 NOTE — Telephone Encounter (Signed)
Copied from [place]. Topic: Quick Communication - See Telephone Encounter >> Oct 06, 2017  8:45 AM Ahmed Prima L wrote: CRM for notification. See Telephone encounter for:   10/06/17.  Patient is wanting to change her pharmacy and wants all her meds sent to the new one [facility] mail [facility] PHONE NUMBER [phone]  hydrochlorothiazide (HYDRODIURIL) 25 MG tablet atorvastatin (LIPITOR) 10 MG tablet triamterene-hydrochlorothiazide (MAXZIDE-25) 37.5-25 MG tablet Tizanidine HCL 4mg  Escitalopram 20mg  metFORMIN (GLUCOPHAGE) 500 MG tablet gabapentin (NEURONTIN) 300 MG capsule diclofenac (CATAFLAM) 50 MG tablet hydrochlorothiazide (HYDRODIURIL) 25 MG tablet hydrOXYzine (ATARAX/VISTARIL) 25 MG tablet

## 2017-10-08 ENCOUNTER — Other Ambulatory Visit: Payer: Self-pay

## 2017-10-08 ENCOUNTER — Telehealth: Payer: Self-pay | Admitting: Family Medicine

## 2017-10-08 MED ORDER — HYDROXYZINE HCL 25 MG PO TABS: 25.0000 mg | ORAL_TABLET | Freq: Two times a day (BID) | ORAL | 0 refills | Status: DC

## 2017-10-08 MED ORDER — METFORMIN HCL 500 MG PO TABS: 1000.0000 mg | ORAL_TABLET | Freq: Two times a day (BID) | ORAL | 0 refills | Status: DC

## 2017-10-08 MED ORDER — HYDROCHLOROTHIAZIDE 25 MG PO TABS: 25.0000 mg | ORAL_TABLET | Freq: Every day | ORAL | 0 refills | Status: DC

## 2017-10-08 MED ORDER — GABAPENTIN 300 MG PO CAPS: 600.0000 mg | ORAL_CAPSULE | Freq: Three times a day (TID) | ORAL | 0 refills | Status: DC

## 2017-10-08 MED ORDER — ESCITALOPRAM OXALATE 20 MG PO TABS: 20.0000 mg | ORAL_TABLET | Freq: Every day | ORAL | 0 refills | Status: DC

## 2017-10-08 MED ORDER — ATORVASTATIN CALCIUM 10 MG PO TABS: 10.0000 mg | ORAL_TABLET | Freq: Every day | ORAL | 3 refills | Status: DC

## 2017-10-08 MED ORDER — TRIAMTERENE-HCTZ 37.5-25 MG PO TABS: 1.0000 | ORAL_TABLET | Freq: Every day | ORAL | 5 refills | Status: DC

## 2017-10-08 NOTE — Telephone Encounter (Signed)
Copied from [place]. Topic: Quick Communication - Rx Refill/Question >> Oct 08, 2017  1:09 PM Oliver Pila B wrote: Pharmacist asked if she needs to fill both or to cancel out one of these medications since both were requested to be filled at the same time, contact pharmacy  Medication: hydrochlorothiazide (HYDRODIURIL) 25 MG tablet [[id number]] , triamterene-hydrochlorothiazide (MAXZIDE-25) 37.5-25 MG tablet [[id number]]  Reference# [id number]

## 2017-10-08 NOTE — Telephone Encounter (Signed)
Rx sent 

## 2017-10-09 ENCOUNTER — Encounter: Payer: Self-pay | Admitting: Internal Medicine

## 2017-10-09 NOTE — Telephone Encounter (Signed)
Patient requested these medications due to ne pharmacy.

## 2017-10-14 ENCOUNTER — Telehealth: Payer: Self-pay | Admitting: Family Medicine

## 2017-10-14 MED ORDER — HYDROXYZINE HCL 25 MG PO TABS: 25.0000 mg | ORAL_TABLET | Freq: Two times a day (BID) | ORAL | 0 refills | Status: DC

## 2017-10-14 NOTE — Telephone Encounter (Signed)
Called pharmacy was placed on hold for 10 minutes had to hang up due to rooming patients. I resent the medications with clear instructions and generic name of meds

## 2017-10-14 NOTE — Telephone Encounter (Signed)
Copied from [place]. Topic: Quick Communication - Rx Refill/Question >> Oct 14, 2017  9:56 AM Cecelia Byars, NT wrote: Medication:  hydrOXYzine (ATARAX/VISTARIL) 25 MG tablet  Has the patient contacted their pharmacy? {yes  (Agent: If no, request that the patient contact the pharmacy for the refill.) Preferred Pharmacy (with phone number or street name): [place], [place] [phone] (Phone) [phone] (Fax Agent: Please be advised that RX refills may take up to 3 business days. We ask that you follow-up with your pharmacy. The pharmacy called and would like to know which prescribe the atarax or the vistaril for the patient please call [phone] ref # [id number]

## 2017-10-14 NOTE — Telephone Encounter (Signed)
She needs Hydroxyzine, they should dispense the generic version it does not say DAW. They should onlly dispense brand name Atarax or Vistaril if specifically asked for as DAW

## 2017-10-15 ENCOUNTER — Telehealth: Payer: Self-pay | Admitting: Family Medicine

## 2017-10-15 NOTE — Telephone Encounter (Signed)
Copied from [place] [phone]. Topic: General - Other >> Oct 15, 2017  9:11 AM Cecelia Byars, NT wrote: Reason for CRM: [facility] called and has a question about a medication triamterene-hydrochlorothiazide (MAXZIDE-25) 37.5-25 MG tablet , hydrOXYzine (ATARAX/VISTARIL) 25 MG tablet they are wanting to know if the patient should be on both medications please call them at  [phone]  the reference # is [id number]

## 2017-10-17 NOTE — Telephone Encounter (Signed)
Sent medications in.

## 2017-11-17 ENCOUNTER — Other Ambulatory Visit: Payer: Self-pay | Admitting: Family Medicine

## 2017-11-20 ENCOUNTER — Encounter: Payer: Self-pay | Admitting: Internal Medicine

## 2017-11-24 ENCOUNTER — Other Ambulatory Visit: Payer: Self-pay | Admitting: Family Medicine

## 2017-12-15 ENCOUNTER — Other Ambulatory Visit (INDEPENDENT_AMBULATORY_CARE_PROVIDER_SITE_OTHER): Payer: MEDICARE

## 2017-12-15 DIAGNOSIS — E083299 Diabetes mellitus due to underlying condition with mild nonproliferative diabetic retinopathy without macular edema, unspecified eye: Secondary | ICD-10-CM

## 2017-12-15 DIAGNOSIS — E782 Mixed hyperlipidemia: Secondary | ICD-10-CM

## 2017-12-15 DIAGNOSIS — I10 Essential (primary) hypertension: Secondary | ICD-10-CM

## 2017-12-15 LAB — COMPREHENSIVE METABOLIC PANEL
ALBUMIN: 4.3 g/dL (ref 3.5–5.2)
ALK PHOS: 59 U/L (ref 39–117)
ALT: 14 U/L (ref 0–35)
AST: 12 U/L (ref 0–37)
BILIRUBIN TOTAL: 0.9 mg/dL (ref 0.2–1.2)
BUN: 17 mg/dL (ref 6–23)
CALCIUM: 9.7 mg/dL (ref 8.4–10.5)
CO2: 25 mEq/L (ref 19–32)
Chloride: 103 mEq/L (ref 96–112)
Creatinine, Ser: 1.47 mg/dL — ABNORMAL HIGH (ref 0.40–1.20)
GFR: 44.94 mL/min — AB (ref >60.00)
Glucose, Bld: 212 mg/dL — ABNORMAL HIGH (ref 70–99)
POTASSIUM: 4.1 meq/L (ref 3.5–5.1)
Sodium: 139 mEq/L (ref 135–145)
TOTAL PROTEIN: 7.3 g/dL (ref 6.0–8.3)

## 2017-12-15 LAB — LIPID PANEL
Cholesterol: 162 mg/dL (ref 0–200)
HDL: 51.5 mg/dL (ref >39.00)
LDL Cholesterol: 83 mg/dL (ref 0–99)
NONHDL: 110.53
TRIGLYCERIDES: 138 mg/dL (ref 0.0–149.0)
Total CHOL/HDL Ratio: 3
VLDL: 27.6 mg/dL (ref 0.0–40.0)

## 2017-12-15 LAB — CBC
HEMATOCRIT: 33.6 % — AB (ref 36.0–46.0)
Hemoglobin: 11.2 g/dL — ABNORMAL LOW (ref 12.0–15.0)
MCHC: 33.2 g/dL (ref 30.0–36.0)
MCV: 89.8 fl (ref 78.0–100.0)
Platelets: 598 10*3/uL — ABNORMAL HIGH (ref 150.0–400.0)
RBC: 3.74 Mil/uL — ABNORMAL LOW (ref 3.87–5.11)
RDW: 14.3 % (ref 11.5–15.5)
WBC: 10.6 10*3/uL — AB (ref 4.0–10.5)

## 2017-12-15 LAB — HEMOGLOBIN A1C: HEMOGLOBIN A1C: 8.2 % — AB (ref 4.6–6.5)

## 2017-12-15 LAB — TSH: TSH: 1.9 u[IU]/mL (ref 0.35–4.50)

## 2017-12-18 ENCOUNTER — Encounter: Payer: Self-pay | Admitting: Family Medicine

## 2017-12-18 ENCOUNTER — Ambulatory Visit (INDEPENDENT_AMBULATORY_CARE_PROVIDER_SITE_OTHER): Payer: MEDICARE | Admitting: Family Medicine

## 2017-12-18 VITALS — BP 142/80 | HR 61 | Temp 98.3°F | Resp 18 | Wt 174.0 lb

## 2017-12-18 DIAGNOSIS — I10 Essential (primary) hypertension: Secondary | ICD-10-CM | POA: Diagnosis not present

## 2017-12-18 DIAGNOSIS — E782 Mixed hyperlipidemia: Secondary | ICD-10-CM

## 2017-12-18 DIAGNOSIS — E083299 Diabetes mellitus due to underlying condition with mild nonproliferative diabetic retinopathy without macular edema, unspecified eye: Secondary | ICD-10-CM

## 2017-12-18 DIAGNOSIS — F418 Other specified anxiety disorders: Secondary | ICD-10-CM | POA: Diagnosis not present

## 2017-12-18 DIAGNOSIS — M545 Low back pain: Secondary | ICD-10-CM | POA: Diagnosis not present

## 2017-12-18 LAB — URINALYSIS, ROUTINE W REFLEX MICROSCOPIC
Bilirubin Urine: NEGATIVE
HGB URINE DIPSTICK: NEGATIVE
Leukocytes, UA: NEGATIVE
NITRITE: NEGATIVE
RBC / HPF: NONE SEEN (ref 0–?)
Specific Gravity, Urine: 1.025 (ref 1.000–1.030)
Total Protein, Urine: 30 — AB
Urine Glucose: 500 — AB
Urobilinogen, UA: 0.2 (ref 0.0–1.0)
WBC UA: NONE SEEN (ref 0–?)
pH: 6.5 (ref 5.0–8.0)

## 2017-12-18 LAB — MICROALBUMIN / CREATININE URINE RATIO
Creatinine,U: 144 mg/dL
Microalb Creat Ratio: 13.4 mg/g (ref 0.0–30.0)
Microalb, Ur: 19.3 mg/dL — ABNORMAL HIGH (ref 0.0–1.9)

## 2017-12-18 MED ORDER — CELECOXIB 200 MG PO CAPS: 200.0000 mg | ORAL_CAPSULE | Freq: Two times a day (BID) | ORAL | 3 refills | Status: DC

## 2017-12-18 NOTE — Assessment & Plan Note (Signed)
Well controlled, no changes to meds. Encouraged heart healthy diet such as the DASH diet and exercise as tolerated.  °

## 2017-12-18 NOTE — Progress Notes (Addendum)
Subjective:  I acted as a Education administrator for Dr. Charlett Blake. [name], [place]  Patient ID: Beth Maldonado, female    DOB: 11-25-1945, 72 y.o.   MRN: [id number]  No chief complaint on file.   HPI  Patient is in today for a 3 month follow up. She is following up on her DM, HTN and other medical concerns. Patient states she is having trouble walking, unable to get her Cataflam. No recent febrile illness or acute hospitalizations. Denies CP/palp/SOB/HA/congestion/fevers/GI or GU c/o. Taking meds as prescribed. She notes the pain in her legs is bad enough sheis having trouble walking, no recent fall or injury but when they cahnged her Diclofenac it has not worked. She has tried OTC pain meds in the past without any significant improvement.    Patient Care Team: Mosie Lukes, MD as PCP - General (Family Medicine) Marin Olp Rudell Cobb, MD as Consulting Physician (Oncology) Gatha Mayer, MD as Consulting Physician (Gastroenterology) Calvert Cantor, MD as Consulting Physician (Ophthalmology) Alphonsa Overall, MD as Consulting Physician (General Surgery)   Past Medical History:  Diagnosis Date  . Acute bronchitis 03/29/2013  . Acute upper respiratory infection 11/27/2014  . Allergy   . Anemia 07/02/2015  . Arthritis   . Back pain    upper and lower last 20 years  . Cancer of left colon ([place]) 12/18/2015  . Depression with anxiety 06/27/2013  . Diabetes mellitus   . Hx of adenomatous colonic polyps 10/27/2015  . Hyperlipidemia   . Hypertension   . Insomnia 06/30/2015  . Medicare annual wellness visit, subsequent 03/23/2014  . Neck pain on left side 05/29/2014   hx of  . Neuromuscular disorder (HCC)    neuropathy feet  . Neuropathy   . Osteopenia   . Preventative health care 07/02/2015  . Tinnitus    RIGHT EAR ALL THE TIME    Past Surgical History:  Procedure Laterality Date  . BREAST ENHANCEMENT SURGERY Bilateral 1987   Reduction  . CATARACT EXTRACTION, BILATERAL  2011  . COLONOSCOPY  2017   colon  cancer  . [place]  . KNEE ARTHROSCOPY Right 2003  . LAPAROSCOPIC SIGMOID COLECTOMY N/A 12/18/2015   Procedure: LAPAROSCOPIC SIGMOID COLECTOMY;  Surgeon: Alphonsa Overall, MD;  Location: [facility];  Service: General;  Laterality: N/A;  . TUBAL LIGATION  1970  . VAGINAL HYSTERECTOMY  1974   partial    Family History  Problem Relation Age of Onset  . Diabetes Mother   . Heart attack Father   . Heart disease Father        61 and 67 MI  . Diabetes Sister   . Sickle cell anemia Sister   . Arthritis Sister   . Hypertension Sister   . Diabetes Sister   . Thyroid disease Sister   . Arthritis Sister   . Glaucoma Sister   . Colon cancer Neg Hx     Social History   Socioeconomic History  . Marital status: Married    Spouse name: Not on file  . Number of children: 1  . Years of education: Not on file  . Highest education level: Not on file  Occupational History  . Occupation: retired  Scientific laboratory technician  . Financial resource strain: Not on file  . Food insecurity:    Worry: Not on file    Inability: Not on file  . Transportation needs:    Medical: Not on file    Non-medical: Not on file  Tobacco Use  .  Smoking status: Former Smoker    Packs/day: 1.00    Years: 20.00    Pack years: 20.00    Types: Cigarettes    Start date: 10/03/1966    Last attempt to quit: 11/03/1986    Years since quitting: 31.1  . Smokeless tobacco: Never Used  . Tobacco comment: quit 28 years ago  Substance and Sexual Activity  . Alcohol use: Yes    Alcohol/week: 0.0 oz    Comment: Rare- Special Occasional   . Drug use: No  . Sexual activity: Not on file    Comment: lives with husband, no dietary restrictions  Lifestyle  . Physical activity:    Days per week: Not on file    Minutes per session: Not on file  . Stress: Not on file  Relationships  . Social connections:    Talks on phone: Not on file    Gets together: Not on file    Attends religious service: Not on file    Active member of  club or organization: Not on file    Attends meetings of clubs or organizations: Not on file    Relationship status: Not on file  . Intimate partner violence:    Fear of current or ex partner: Not on file    Emotionally abused: Not on file    Physically abused: Not on file    Forced sexual activity: Not on file  Other Topics Concern  . Not on file  Social History Narrative  . Not on file    Outpatient Medications Prior to Visit  Medication Sig Dispense Refill  . aspirin EC 81 MG tablet Take 81 mg by mouth daily.    Marland Kitchen atorvastatin (LIPITOR) 10 MG tablet Take 1 tablet (10 mg total) by mouth daily. 30 tablet 3  . Continuous Blood Gluc Receiver (FREESTYLE LIBRE 14 DAY READER) DEVI 1 Device by Does not apply route 3 (three) times daily as needed. 1 Device 0  . Continuous Blood Gluc Sensor (FREESTYLE LIBRE 14 DAY SENSOR) MISC 1 each by Does not apply route every 14 (fourteen) days. 3 each 5  . cyclobenzaprine (FLEXERIL) 5 MG tablet TAKE 1 TABLET BY MOUTH ATBEDTIME AS NEEDED FOR MUSCLE SPASMS. 30 tablet 0  . escitalopram (LEXAPRO) 20 MG tablet Take 1 tablet (20 mg total) by mouth daily. 90 tablet 0  . escitalopram (LEXAPRO) 20 MG tablet TAKE 1 TABLET BY MOUTH  DAILY 90 tablet 0  . gabapentin (NEURONTIN) 300 MG capsule TAKE 2 CAPSULES BY MOUTH 3  TIMES DAILY 180 capsule 0  . glucose blood (ONETOUCH VERIO) test strip Use four times daily to check blood sugar.  DX  E11.9 200 each 3  . hydrochlorothiazide (HYDRODIURIL) 25 MG tablet Take 1 tablet (25 mg total) by mouth daily. 30 tablet 0  . hydrOXYzine (ATARAX/VISTARIL) 25 MG tablet Take 1 tablet (25 mg total) by mouth 2 (two) times daily. 180 tablet 0  . hydrOXYzine (ATARAX/VISTARIL) 25 MG tablet TAKE 1 TABLET BY MOUTH TWO  TIMES DAILY 180 tablet 1  . Insulin Pen Needle 32G X 4 MM MISC Use for insulin injection twice a day 250.00 100 each 2  . lidocaine (LIDODERM) 5 % Place 1 patch onto the skin daily. Remove & Discard patch within 12 hours or as  directed by MD (Patient taking differently: Place 1 patch onto the skin daily as needed (For pain.). Remove & Discard patch within 12 hours or as directed by MD) 30 patch 0  . metFORMIN (  GLUCOPHAGE) 500 MG tablet TAKE 2 TABLETS BY MOUTH TWO TIMES DAILY 120 tablet 0  . Multiple Vitamin (MULTIVITAMIN) tablet Take 1 tablet by mouth daily.    [name] DELICA LANCETS 33L MISC Use as directed to check blood sugar 4 times per day dx code E11.9 200 each 3  . TOUJEO SOLOSTAR 300 UNIT/ML SOPN INJECT SUBCUTANEOUSLY 22  UNITS DAILY 9 mL 1  . triamterene-hydrochlorothiazide (MAXZIDE-25) 37.5-25 MG tablet Take 1 tablet by mouth daily. 30 tablet 5  . diclofenac (CATAFLAM) 50 MG tablet Take 50 mg by mouth 2 (two) times daily.     No facility-administered medications prior to visit.     Allergies  Allergen Reactions  . Penicillins Swelling and Other (See Comments)    tongue swelled per pt Has patient had a PCN reaction causing immediate rash, facial/tongue/throat swelling, SOB or lightheadedness with hypotension: yes Has patient had a PCN reaction causing severe rash involving mucus membranes or skin necrosis: no Has patient had a PCN reaction that required hospitalization no  Has patient had a PCN reaction occurring within the last 10 years: unsure If all of the above answers are "NO", then may proceed with Cephalosporin use.     Review of Systems  Constitutional: Positive for malaise/fatigue. Negative for fever.  HENT: Negative for congestion.   Eyes: Negative for blurred vision.  Respiratory: Negative for shortness of breath.   Cardiovascular: Negative for chest pain, palpitations and leg swelling.  Gastrointestinal: Negative for abdominal pain, blood in stool and nausea.  Genitourinary: Negative for dysuria and frequency.  Musculoskeletal: Positive for back pain and joint pain. Negative for falls.  Skin: Negative for rash.  Neurological: Negative for dizziness, loss of consciousness and  headaches.  Endo/Heme/Allergies: Negative for environmental allergies.  Psychiatric/Behavioral: Positive for memory loss. Negative for depression. The patient is nervous/anxious.        Objective:    Physical Exam  Constitutional: She is oriented to person, place, and time. She appears well-developed and well-nourished. No distress.  HENT:  Head: Normocephalic and atraumatic.  Nose: Nose normal.  Eyes: Right eye exhibits no discharge. Left eye exhibits no discharge.  Neck: Normal range of motion. Neck supple.  Cardiovascular: Normal rate and regular rhythm.  No murmur heard. Pulmonary/Chest: Effort normal and breath sounds normal.  Abdominal: Soft. Bowel sounds are normal. There is no tenderness.  Musculoskeletal: She exhibits no edema.  Neurological: She is alert and oriented to person, place, and time.  Skin: Skin is warm and dry.  Psychiatric: She has a normal mood and affect.  Nursing note and vitals reviewed.   BP (!) 142/80 (BP Location: Left Arm, Patient Position: Sitting, Cuff Size: Normal)   Pulse 61   Temp 98.3 F (36.8 C) (Oral)   Resp 18   Wt 174 lb (78.9 kg)   SpO2 98%   BMI 32.02 kg/m  Wt Readings from Last 3 Encounters:  12/18/17 174 lb (78.9 kg)  09/16/17 173 lb 9.6 oz (78.7 kg)  07/15/17 174 lb (78.9 kg)   BP Readings from Last 3 Encounters:  12/18/17 (!) 142/80  09/16/17 (!) 144/62  07/15/17 130/65     Immunization History  Administered Date(s) Administered  . Influenza Split 05/21/2011, 06/18/2012  . Influenza Whole 05/12/2013  . Influenza, High Dose Seasonal PF 04/23/2016  . Influenza,inj,Quad PF,6+ Mos 03/21/2014  . Influenza-Unspecified 04/29/2015  . Pneumococcal Conjugate-13 03/21/2014  . Pneumococcal Polysaccharide-23 05/21/2011    Health Maintenance  Topic Date Due  . FOOT EXAM  07/09/2016  . COLONOSCOPY  10/16/2017  . INFLUENZA VACCINE  02/26/2018  . OPHTHALMOLOGY EXAM  05/22/2018  . HEMOGLOBIN A1C  06/17/2018  . MAMMOGRAM   05/28/2019  . TETANUS/TDAP  08/08/2019  . DEXA SCAN  Completed  . Hepatitis C Screening  Completed  . PNA vac Low Risk Adult  Completed    Lab Results  Component Value Date   WBC 10.6 (H) 12/15/2017   HGB 11.2 (L) 12/15/2017   HCT 33.6 (L) 12/15/2017   PLT 598.0 (H) 12/15/2017   GLUCOSE 212 (H) 12/15/2017   CHOL 162 12/15/2017   TRIG 138.0 12/15/2017   HDL 51.50 12/15/2017   LDLDIRECT 109.0 09/20/2015   LDLCALC 83 12/15/2017   ALT 14 12/15/2017   AST 12 12/15/2017   NA 139 12/15/2017   K 4.1 12/15/2017   CL 103 12/15/2017   CREATININE 1.47 (H) 12/15/2017   BUN 17 12/15/2017   CO2 25 12/15/2017   TSH 1.90 12/15/2017   HGBA1C 8.2 (H) 12/15/2017   MICROALBUR 19.3 (H) 12/18/2017    Lab Results  Component Value Date   TSH 1.90 12/15/2017   Lab Results  Component Value Date   WBC 10.6 (H) 12/15/2017   HGB 11.2 (L) 12/15/2017   HCT 33.6 (L) 12/15/2017   MCV 89.8 12/15/2017   PLT 598.0 (H) 12/15/2017   Lab Results  Component Value Date   NA 139 12/15/2017   K 4.1 12/15/2017   CHLORIDE 103 10/30/2016   CO2 25 12/15/2017   GLUCOSE 212 (H) 12/15/2017   BUN 17 12/15/2017   CREATININE 1.47 (H) 12/15/2017   BILITOT 0.9 12/15/2017   ALKPHOS 59 12/15/2017   AST 12 12/15/2017   ALT 14 12/15/2017   PROT 7.3 12/15/2017   ALBUMIN 4.3 12/15/2017   CALCIUM 9.7 12/15/2017   ANIONGAP 11 10/30/2016   EGFR 44 (L) 10/30/2016   GFR 44.94 (L) 12/15/2017   Lab Results  Component Value Date   CHOL 162 12/15/2017   Lab Results  Component Value Date   HDL 51.50 12/15/2017   Lab Results  Component Value Date   LDLCALC 83 12/15/2017   Lab Results  Component Value Date   TRIG 138.0 12/15/2017   Lab Results  Component Value Date   CHOLHDL 3 12/15/2017   Lab Results  Component Value Date   HGBA1C 8.2 (H) 12/15/2017         Assessment & Plan:   Problem List Items Addressed This Visit    Diabetes mellitus ([place])    hgba1c acceptable, improving, minimize simple  carbs. Increase exercise as tolerated. Continue current meds      Relevant Medications   lisinopril (PRINIVIL,ZESTRIL) 5 MG tablet   Hypertension    Well controlled, no changes to meds. Encouraged heart healthy diet such as the DASH diet and exercise as tolerated.       Relevant Medications   lisinopril (PRINIVIL,ZESTRIL) 5 MG tablet   Other Relevant Orders   Microalbumin / creatinine urine ratio (Completed)   Comprehensive metabolic panel   Hyperlipidemia    Tolerating statin, encouraged heart healthy diet, avoid trans fats, minimize simple carbs and saturated fats. Increase exercise as tolerated      Relevant Medications   lisinopril (PRINIVIL,ZESTRIL) 5 MG tablet   Depression with anxiety    Is under a great deal of stress dealing with her husband and sister's estates but is also concerned that her memory is fading. Encouraged to learn somehting new and increase her sleep from  5 to 6-7 hours a night. Feels her Lexapro is helping enough. Run a MMSE       Other Visit Diagnoses    Low back pain, unspecified back pain laterality, unspecified chronicity, with sciatica presence unspecified    -  Primary   Relevant Medications   celecoxib (CELEBREX) 200 MG capsule   Other Relevant Orders   DG Lumbar Spine Complete (Completed)   Urinalysis   Urine Culture (Completed)   Urinalysis, Routine w reflex microscopic (Completed)      I have discontinued Barbarann Ehlers Cretella's diclofenac. I am also having her start on celecoxib and lisinopril. Additionally, I am having her maintain her Insulin Pen Needle, multivitamin, lidocaine, aspirin EC, cyclobenzaprine, ONETOUCH DELICA LANCETS 88K, glucose blood, FREESTYLE LIBRE 14 DAY READER, FREESTYLE LIBRE 14 DAY SENSOR, hydrochlorothiazide, triamterene-hydrochlorothiazide, atorvastatin, escitalopram, hydrOXYzine, hydrOXYzine, metFORMIN, gabapentin, TOUJEO SOLOSTAR, and escitalopram.  Meds ordered this encounter  Medications  . celecoxib (CELEBREX)  200 MG capsule    Sig: Take 1 capsule (200 mg total) by mouth 2 (two) times daily.    Dispense:  60 capsule    Refill:  3  . lisinopril (PRINIVIL,ZESTRIL) 5 MG tablet    Sig: Take 1 tablet (5 mg total) by mouth 2 (two) times daily.    Dispense:  90 tablet    Refill:  3    CMA served as scribe during this visit. History, Physical and Plan performed by medical provider. Documentation and orders reviewed and attested to.  Penni Homans, MD

## 2017-12-18 NOTE — Assessment & Plan Note (Signed)
Tolerating statin, encouraged heart healthy diet, avoid trans fats, minimize simple carbs and saturated fats. Increase exercise as tolerated 

## 2017-12-18 NOTE — Assessment & Plan Note (Signed)
hgba1c acceptable, improving, minimize simple carbs. Increase exercise as tolerated. Continue current meds

## 2017-12-18 NOTE — Assessment & Plan Note (Addendum)
Is under a great deal of stress dealing with her husband and sister's estates but is also concerned that her memory is fading. Encouraged to learn somehting new and increase her sleep from 5 to 6-7 hours a night. Feels her Lexapro is helping enough. Run a MMSE

## 2017-12-18 NOTE — Patient Instructions (Addendum)
Celebrex twice and Tylenol/Acetaminophen ES 500 mg tabs 1 tab po twice daily  Benefiber powder twice daily, 64 oz clear fluids  Can also add Miralax to Benefiber   Encouraged increased hydration and fiber in diet. Daily probiotics. If bowels not moving can use MOM 2 tbls po in 4 oz of warm prune juice by mouth every 2-3 days. If no results then repeat in 4 hours with  Dulcolax suppository pr, may repeat again in 4 more hours as needed. Seek care if symptoms worsen. Consider daily Miralax and/or Dulcolax if symptoms persist.    Back Pain, Adult Back pain is very common. The pain often gets better over time. The cause of back pain is usually not dangerous. Most people can learn to manage their back pain on their own. Follow these instructions at home: Watch your back pain for any changes. The following actions may help to lessen any pain you are feeling:  Stay active. Start with short walks on flat ground if you can. Try to walk farther each day.  Exercise regularly as told by your doctor. Exercise helps your back heal faster. It also helps avoid future injury by keeping your muscles strong and flexible.  Do not sit, drive, or stand in one place for more than 30 minutes.  Do not stay in bed. Resting more than 1-2 days can slow down your recovery.  Be careful when you bend or lift an object. Use good form when lifting: ? Bend at your knees. ? Keep the object close to your body. ? Do not twist.  Sleep on a firm mattress. Lie on your side, and bend your knees. If you lie on your back, put a pillow under your knees.  Take medicines only as told by your doctor.  Put ice on the injured area. ? Put ice in a plastic bag. ? Place a towel between your skin and the bag. ? Leave the ice on for 20 minutes, 2-3 times a day for the first 2-3 days. After that, you can switch between ice and heat packs.  Avoid feeling anxious or stressed. Find good ways to deal with stress, such as  exercise.  Maintain a healthy weight. Extra weight puts stress on your back.  Contact a doctor if:  You have pain that does not go away with rest or medicine.  You have worsening pain that goes down into your legs or buttocks.  You have pain that does not get better in one week.  You have pain at night.  You lose weight.  You have a fever or chills. Get help right away if:  You cannot control when you poop (bowel movement) or pee (urinate).  Your arms or legs feel weak.  Your arms or legs lose feeling (numbness).  You feel sick to your stomach (nauseous) or throw up (vomit).  You have belly (abdominal) pain.  You feel like you may pass out (faint). This information is not intended to replace advice given to you by your health care provider. Make sure you discuss any questions you have with your health care provider. Document Released: 01/01/2008 Document Revised: 12/21/2015 Document Reviewed: 11/16/2013 Elsevier Interactive Patient Education  [employer].

## 2017-12-19 LAB — URINE CULTURE
MICRO NUMBER: 90627708
SPECIMEN QUALITY:: ADEQUATE

## 2017-12-19 MED ORDER — LISINOPRIL 5 MG PO TABS: 5.0000 mg | ORAL_TABLET | Freq: Two times a day (BID) | ORAL | 3 refills | Status: DC

## 2017-12-20 ENCOUNTER — Ambulatory Visit (HOSPITAL_BASED_OUTPATIENT_CLINIC_OR_DEPARTMENT_OTHER)
Admission: RE | Admit: 2017-12-20 | Discharge: 2017-12-20 | Disposition: A | Discharge status: Home / Self Care | Payer: MEDICARE | Source: Ambulatory Visit | Attending: Family Medicine | Admitting: Family Medicine

## 2017-12-20 DIAGNOSIS — M545 Low back pain: Secondary | ICD-10-CM | POA: Diagnosis not present

## 2017-12-22 ENCOUNTER — Other Ambulatory Visit: Payer: Self-pay | Admitting: Family Medicine

## 2017-12-23 ENCOUNTER — Telehealth: Payer: Self-pay | Admitting: Family Medicine

## 2017-12-23 NOTE — Telephone Encounter (Signed)
Spoke with the pt she stated she needed a refill and that the pharmacy will be sending a fax

## 2017-12-23 NOTE — Telephone Encounter (Signed)
Copied from [place] [phone]. Topic: Quick Communication - See Telephone Encounter >> Dec 23, 2017  8:59 AM Synthia Innocent wrote: CRM for notification. See Telephone encounter for: 12/23/17. Patient calling and wanted to make provider aware that she has not been taking hydrochlorothiazide (HYDRODIURIL) 25 MG tablet, does not know how long, maybe a month or more.

## 2018-01-05 DIAGNOSIS — M4316 Spondylolisthesis, lumbar region: Secondary | ICD-10-CM | POA: Diagnosis not present

## 2018-01-05 DIAGNOSIS — M545 Low back pain: Secondary | ICD-10-CM | POA: Diagnosis not present

## 2018-01-05 DIAGNOSIS — I10 Essential (primary) hypertension: Secondary | ICD-10-CM | POA: Diagnosis not present

## 2018-01-05 DIAGNOSIS — Z6832 Body mass index (BMI) 32.0-32.9, adult: Secondary | ICD-10-CM | POA: Diagnosis not present

## 2018-01-06 ENCOUNTER — Other Ambulatory Visit: Payer: Self-pay | Admitting: Family Medicine

## 2018-01-06 NOTE — Telephone Encounter (Signed)
Relation to pt: self  Call back number: [phone]  Reason for call:   HUMALOG KWIKPEN 100 UNIT/ML KiwkPen please send to [facility] and hydrOXYzine (ATARAX/VISTARIL) 25 MG tablet   [facility] [address] [place] [phone] (Phone) [phone] (Fax)

## 2018-01-12 ENCOUNTER — Other Ambulatory Visit: Payer: Self-pay

## 2018-01-12 MED ORDER — HYDROXYZINE HCL 25 MG PO TABS: 25.0000 mg | ORAL_TABLET | Freq: Two times a day (BID) | ORAL | 0 refills | Status: DC

## 2018-01-12 MED ORDER — INSULIN LISPRO 100 UNIT/ML (KWIKPEN): PEN_INJECTOR | SUBCUTANEOUS | 2 refills | Status: DC

## 2018-01-14 ENCOUNTER — Other Ambulatory Visit: Payer: Self-pay

## 2018-01-14 ENCOUNTER — Ambulatory Visit (AMBULATORY_SURGERY_CENTER): Payer: Self-pay | Admitting: *Deleted

## 2018-01-14 VITALS — Ht 62.0 in | Wt 173.0 lb

## 2018-01-14 DIAGNOSIS — Z86010 Personal history of colonic polyps: Secondary | ICD-10-CM

## 2018-01-14 DIAGNOSIS — Z85038 Personal history of other malignant neoplasm of large intestine: Secondary | ICD-10-CM

## 2018-01-14 NOTE — Progress Notes (Signed)
Patient denies any allergies to eggs or soy. Patient denies any problems with anesthesia/sedation. Patient denies any oxygen use at home. Patient denies taking any diet/weight loss medications or blood thinners. EMMI education offered, pt declined. 2 day Miralax prep given to pt. She states she is having some constipation issues and the prep did not work well last time.

## 2018-01-15 ENCOUNTER — Other Ambulatory Visit: Payer: Self-pay | Admitting: Family Medicine

## 2018-01-20 ENCOUNTER — Other Ambulatory Visit: Payer: MEDICARE

## 2018-01-20 ENCOUNTER — Ambulatory Visit: Payer: MEDICARE

## 2018-01-20 ENCOUNTER — Telehealth: Payer: Self-pay

## 2018-01-20 NOTE — Telephone Encounter (Signed)
OK to perform BP check on patient today

## 2018-01-20 NOTE — Telephone Encounter (Signed)
Thank you for the order Dr. Charlett Blake. Patient was however, a no show.

## 2018-01-20 NOTE — Telephone Encounter (Signed)
DR. Charlett Blake. Patient is on the schedule for BP check today, however, I cannot find the written order for this. Could you please write order for today. Verbal will surfice as well.

## 2018-01-21 ENCOUNTER — Other Ambulatory Visit: Payer: Self-pay

## 2018-01-21 MED ORDER — HYDROCHLOROTHIAZIDE 25 MG PO TABS: 25.0000 mg | ORAL_TABLET | Freq: Every day | ORAL | 0 refills | Status: DC

## 2018-01-21 MED ORDER — ATORVASTATIN CALCIUM 10 MG PO TABS: 10.0000 mg | ORAL_TABLET | Freq: Every day | ORAL | 3 refills | Status: DC

## 2018-01-28 ENCOUNTER — Encounter: Payer: 59 | Admitting: Internal Medicine

## 2018-01-28 ENCOUNTER — Ambulatory Visit (AMBULATORY_SURGERY_CENTER): Payer: MEDICARE | Admitting: Internal Medicine

## 2018-01-28 ENCOUNTER — Encounter: Payer: Self-pay | Admitting: Internal Medicine

## 2018-01-28 VITALS — BP 152/85 | HR 84 | Temp 97.1°F | Resp 12 | Ht 61.0 in | Wt 174.0 lb

## 2018-01-28 DIAGNOSIS — D126 Benign neoplasm of colon, unspecified: Secondary | ICD-10-CM | POA: Diagnosis not present

## 2018-01-28 DIAGNOSIS — Z86010 Personal history of colonic polyps: Secondary | ICD-10-CM | POA: Diagnosis present

## 2018-01-28 DIAGNOSIS — K635 Polyp of colon: Secondary | ICD-10-CM

## 2018-01-28 DIAGNOSIS — Z1211 Encounter for screening for malignant neoplasm of colon: Secondary | ICD-10-CM | POA: Diagnosis not present

## 2018-01-28 DIAGNOSIS — D123 Benign neoplasm of transverse colon: Secondary | ICD-10-CM

## 2018-01-28 DIAGNOSIS — D120 Benign neoplasm of cecum: Secondary | ICD-10-CM | POA: Diagnosis not present

## 2018-01-28 DIAGNOSIS — Z85038 Personal history of other malignant neoplasm of large intestine: Secondary | ICD-10-CM | POA: Diagnosis not present

## 2018-01-28 MED ORDER — SODIUM CHLORIDE 0.9 % IV SOLN: 500.0000 mL | Freq: Once | INTRAVENOUS | Status: DC

## 2018-01-28 NOTE — Addendum Note (Signed)
Addended by: Randall Hiss B on: 01/28/2018 05:57 PM   Modules accepted: Miquel Dunn

## 2018-01-28 NOTE — Progress Notes (Signed)
Called to room to assist during endoscopic procedure.  Patient ID and intended procedure confirmed with present staff. Received instructions for my participation in the procedure from the performing physician.  

## 2018-01-28 NOTE — Patient Instructions (Addendum)
I found and removed 4 tiny polyps today - I will let you know pathology results and when to have another routine colonoscopy by mail and/or My Chart.  I anticipate next one will be in 3 years.  I appreciate the opportunity to care for you. Gatha Mayer, MD, [facility]  * handouts on polyps given*  YOU HAD AN ENDOSCOPIC PROCEDURE TODAY AT [place]:   Refer to the procedure report that was given to you for any specific questions about what was found during the examination.  If the procedure report does not answer your questions, please call your gastroenterologist to clarify.  If you requested that your care partner not be given the details of your procedure findings, then the procedure report has been included in a sealed envelope for you to review at your convenience later.  YOU SHOULD EXPECT: Some feelings of bloating in the abdomen. Passage of more gas than usual.  Walking can help get rid of the air that was put into your GI tract during the procedure and reduce the bloating. If you had a lower endoscopy (such as a colonoscopy or flexible sigmoidoscopy) you may notice spotting of blood in your stool or on the toilet paper. If you underwent a bowel prep for your procedure, you may not have a normal bowel movement for a few days.  Please Note:  You might notice some irritation and congestion in your nose or some drainage.  This is from the oxygen used during your procedure.  There is no need for concern and it should clear up in a day or so.  SYMPTOMS TO REPORT IMMEDIATELY:   Following lower endoscopy (colonoscopy or flexible sigmoidoscopy):  Excessive amounts of blood in the stool  Significant tenderness or worsening of abdominal pains  Swelling of the abdomen that is new, acute  Fever of 100F or higher For urgent or emergent issues, a gastroenterologist can be reached at any hour by calling [phone].   DIET:  We do recommend a small meal at first, but  then you may proceed to your regular diet.  Drink plenty of fluids but you should avoid alcoholic beverages for 24 hours.  ACTIVITY:  You should plan to take it easy for the rest of today and you should NOT DRIVE or use heavy machinery until tomorrow (because of the sedation medicines used during the test).    FOLLOW UP: Our staff will call the number listed on your records the next business day following your procedure to check on you and address any questions or concerns that you may have regarding the information given to you following your procedure. If we do not reach you, we will leave a message.  However, if you are feeling well and you are not experiencing any problems, there is no need to return our call.  We will assume that you have returned to your regular daily activities without incident.  If any biopsies were taken you will be contacted by phone or by letter within the next 1-3 weeks.  Please call us at [phone] if you have not heard about the biopsies in 3 weeks.    SIGNATURES/CONFIDENTIALITY: You and/or your care partner have signed paperwork which will be entered into your electronic medical record.  These signatures attest to the fact that that the information above on your After Visit Summary has been reviewed and is understood.  Full responsibility of the confidentiality of this discharge information lies with you and/or  your care-partner. 

## 2018-01-28 NOTE — Progress Notes (Signed)
A/ox3 pleased with MAC, report to RN 

## 2018-01-28 NOTE — Progress Notes (Signed)
Pt's states no medical or surgical changes since previsit or office visit. 

## 2018-01-28 NOTE — Op Note (Signed)
[place] Patient Name: Beth Maldonado Procedure Date: 01/28/2018 7:28 AM MRN: [id number] Endoscopist: Gatha Mayer , MD Age: 72 Referring MD:  Date of Birth: 02-May-1946 Gender: Female Account #: [id number] Procedure:                Colonoscopy Indications:              High risk colon cancer surveillance: Personal                            history of colon cancer, Personal history of                            colonic polyps Medicines:                Propofol per Anesthesia, Monitored Anesthesia Care Procedure:                Pre-Anesthesia Assessment:                           - Prior to the procedure, a History and Physical                            was performed, and patient medications and                            allergies were reviewed. [name] tolerance of                            previous anesthesia was also reviewed. The risks                            and benefits of the procedure and the sedation                            options and risks were discussed with the patient.                            All questions were answered, and informed consent                            was obtained. Prior Anticoagulants: The patient has                            taken no previous anticoagulant or antiplatelet                            agents. ASA Grade Assessment: II - A patient with                            mild systemic disease. After reviewing the risks                            and benefits, the patient was deemed in  satisfactory condition to undergo the procedure.                           After obtaining informed consent, the colonoscope                            was passed under direct vision. Throughout the                            procedure, the patient's blood pressure, pulse, and                            oxygen saturations were monitored continuously. The                            Colonoscope was  introduced through the anus and                            advanced to the the cecum, identified by                            appendiceal orifice and ileocecal valve. The                            colonoscopy was performed without difficulty. The                            patient tolerated the procedure well. The quality                            of the bowel preparation was adequate. The bowel                            preparation used was Miralax. The ileocecal valve,                            appendiceal orifice, and rectum were photographed. Scope In: 7:35:00 AM Scope Out: 7:57:51 AM Scope Withdrawal Time: 0 hours 18 minutes 50 seconds  Total Procedure Duration: 0 hours 22 minutes 51 seconds  Findings:                 Three sessile polyps were found in the transverse                            colon and ileocecal valve. The polyps were                            diminutive in size. These polyps were removed with                            a cold snare. Resection was complete, but the polyp                            tissue was only partially retrieved.  Verification                            of patient identification for the specimen was                            done. Estimated blood loss was minimal.                           A 1 to 2 mm polyp was found in the transverse                            colon. The polyp was flat. The polyp was removed                            with a cold biopsy forceps. Resection and retrieval                            were complete. Verification of patient                            identification for the specimen was done. Estimated                            blood loss was minimal.                           There was evidence of a prior end-to-end                            colo-colonic anastomosis in the sigmoid colon. This                            was patent and was characterized by visible                            sutures. The  anastomosis was traversed.                           The exam was otherwise without abnormality on                            direct and retroflexion views. Complications:            No immediate complications. Estimated Blood Loss:     Estimated blood loss was minimal. Impression:               - Three diminutive polyps in the transverse colon                            and at the ileocecal valve, removed with a cold                            snare. Complete resection. Partial retrieval.                           -  One 1 to 2 mm polyp in the transverse colon,                            removed with a cold biopsy forceps. Resected and                            retrieved.                           - Patent end-to-end colo-colonic anastomosis,                            characterized by visible sutures.                           - The examination was otherwise normal on direct                            and retroflexion views. 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                           - Personal history of malignant neoplasm of the                            colon. resected 2017                           - Personal history of colonic polyps. Recommendation:           - Patient has a contact number available for                            emergencies. The signs and symptoms of potential                            delayed complications were discussed with the                            patient. Return to normal activities tomorrow.                            Written discharge instructions were provided to the                            patient.                           - Resume previous diet.                           - Continue present medications.                           - Repeat  colonoscopy is recommended for                            surveillance. The colonoscopy date will be                            determined after pathology results  from today's                            exam become available for review. Gatha Mayer, MD 01/28/2018 8:06:06 AM This report has been signed electronically.

## 2018-02-02 ENCOUNTER — Telehealth: Payer: Self-pay

## 2018-02-02 ENCOUNTER — Telehealth: Payer: Self-pay | Admitting: *Deleted

## 2018-02-02 NOTE — Telephone Encounter (Signed)
  Follow up Call-  Call back number 01/28/2018 10/16/2016 10/27/2015  Post procedure Call Back phone  # [phone]  Permission to leave phone message Yes Yes Yes  Some recent data might be hidden     Patient questions:  Do you have a fever, pain , or abdominal swelling? No. Pain Score  0 *  Have you tolerated food without any problems? Yes.    Have you been able to return to your normal activities? Yes.    Do you have any questions about your discharge instructions: Diet   No. Medications  No. Follow up visit  No.  Do you have questions or concerns about your Care? No.  Actions: * If pain score is 4 or above: No action needed, pain <4.

## 2018-02-02 NOTE — Telephone Encounter (Signed)
Attempted to reach pt. With follow-up call following endoscopic procedure 01/28/2018.  LM on pt.'s voice mail.  Will try to reach pt. Again later today.

## 2018-02-05 ENCOUNTER — Telehealth: Payer: Self-pay | Admitting: Family Medicine

## 2018-02-05 NOTE — Telephone Encounter (Signed)
Copied from [place] [phone]. Topic: General - Other >> Feb 05, 2018  8:55 AM Marin Olp L wrote: Reason for CRM: [facility] calling to get dx on urine culture completed on 12/18/2017. Please have clinical member return the call.

## 2018-02-06 ENCOUNTER — Encounter: Payer: Self-pay | Admitting: Internal Medicine

## 2018-02-06 NOTE — Progress Notes (Signed)
1 ssp Recall 2022

## 2018-02-10 ENCOUNTER — Telehealth: Payer: Self-pay | Admitting: Family Medicine

## 2018-02-10 DIAGNOSIS — I10 Essential (primary) hypertension: Secondary | ICD-10-CM

## 2018-02-10 NOTE — Telephone Encounter (Signed)
No she is not on the max of either so it is fine but the best approach is to keep it simple and just keep her on the Maxzide 37.5/25 mg tab daily and recheck bp and cmp in 2-4 weeks

## 2018-02-10 NOTE — Telephone Encounter (Signed)
I have looked in her chart to find when the medication HCTZ was added and also the Maxzide. Patient wants to know should she be on both medications    Please advise

## 2018-02-10 NOTE — Telephone Encounter (Signed)
Copied from [place] [id number]. Topic: General - Other >> Feb 10, 2018 10:19 AM Lennox Solders wrote: Reason for CRM: pt is calling she is taking triamterene-hctz 37.5-25 mg and hydrochlorothiazide 25 mg . Pt would like to clarify should she be taking both medication. Pt would need a refill on medication she should be taking. [facility]

## 2018-02-12 DIAGNOSIS — M4316 Spondylolisthesis, lumbar region: Secondary | ICD-10-CM | POA: Diagnosis not present

## 2018-02-12 DIAGNOSIS — M47816 Spondylosis without myelopathy or radiculopathy, lumbar region: Secondary | ICD-10-CM | POA: Diagnosis not present

## 2018-02-12 MED ORDER — TRIAMTERENE-HCTZ 37.5-25 MG PO TABS: 1.0000 | ORAL_TABLET | Freq: Every day | ORAL | 5 refills | Status: DC

## 2018-02-12 NOTE — Telephone Encounter (Signed)
Left detailed message on voice mail about calling in the medication and also orders her lab work a  Cmp and she needs a nurse visit in 2-4 weeks.   Medication sent to pharmacy and orders placed for blood work.

## 2018-02-12 NOTE — Addendum Note (Signed)
Addended by: Magdalene Molly A on: 02/12/2018 10:07 AM   Modules accepted: Orders

## 2018-02-13 ENCOUNTER — Telehealth: Payer: Self-pay | Admitting: Internal Medicine

## 2018-02-13 NOTE — Telephone Encounter (Signed)
Patient reports that since the colonoscopy she has had constipation.  She will begin Miralax 1-2 capfuls a day.  She will call back for any additional questions or concerns.

## 2018-02-26 ENCOUNTER — Ambulatory Visit: Payer: MEDICARE | Admitting: Family Medicine

## 2018-02-26 ENCOUNTER — Other Ambulatory Visit (INDEPENDENT_AMBULATORY_CARE_PROVIDER_SITE_OTHER): Payer: MEDICARE

## 2018-02-26 VITALS — BP 119/71 | HR 91

## 2018-02-26 DIAGNOSIS — I10 Essential (primary) hypertension: Secondary | ICD-10-CM | POA: Diagnosis not present

## 2018-02-26 LAB — COMPREHENSIVE METABOLIC PANEL
ALT: 18 U/L (ref 0–35)
AST: 18 U/L (ref 0–37)
Albumin: 4.3 g/dL (ref 3.5–5.2)
Alkaline Phosphatase: 58 U/L (ref 39–117)
BUN: 26 mg/dL — ABNORMAL HIGH (ref 6–23)
CALCIUM: 9.9 mg/dL (ref 8.4–10.5)
CHLORIDE: 104 meq/L (ref 96–112)
CO2: 25 meq/L (ref 19–32)
Creatinine, Ser: 1.63 mg/dL — ABNORMAL HIGH (ref 0.40–1.20)
GFR: 39.87 mL/min — AB (ref >60.00)
GLUCOSE: 132 mg/dL — AB (ref 70–99)
POTASSIUM: 3.9 meq/L (ref 3.5–5.1)
Sodium: 140 mEq/L (ref 135–145)
Total Bilirubin: 0.6 mg/dL (ref 0.2–1.2)
Total Protein: 7.3 g/dL (ref 6.0–8.3)

## 2018-02-26 NOTE — Progress Notes (Addendum)
Pre visit review using our clinic tool,if applicable. No additional management support is needed unless otherwise documented below in the visit note.   .Pt here for Blood pressure check per  Order from Dr. Ellwood Handler dated 02/10/18.  No complaints voiced today.   Patient to have CMP collected today.  Pt reports compliance with medication. States she takes so many she gets confused sometimes. Advised to schedule a nurse visit and bring all medications in for review. Patient agreed.  BP today @ = 119/71  P = 91 33                                                           Pt advised per Dr. Charlett Blake to make sure she is taking her Maxzide and not both Hctz as well Patient went to lab for CMP and scheduled NV for next week. Nursing blood pressure check note reviewed. Agree with documention and plan.

## 2018-02-27 ENCOUNTER — Other Ambulatory Visit: Payer: Self-pay | Admitting: Family Medicine

## 2018-02-27 NOTE — Telephone Encounter (Signed)
Copied from [place] [phone]. Topic: General - Other >> Feb 10, 2018 10:19 AM Lennox Solders wrote: Reason for CRM: pt is calling she is taking triamterne-hctz 37.5-25 mg and hydrochlorothiazide 25 mg . Pt would like to clarify should she be taking both medication. Pt would need a refill on medication she should be taking. [facility] >> Feb 27, 2018 10:34 AM Oneta Rack wrote: Relation to pt: self  Phone: [phone] (H)  Reason for call:  Patient returning nurse "Santiago Glad" call regarding clarity regarding medication, please advise

## 2018-02-27 NOTE — Telephone Encounter (Signed)
Called patient regarding her BP medication. Advised her that per Dr. Charlett Blake she was only to take the [facility] and not both medications. Reminded patient to bring in all medications to appointment next week. Patient agreed.

## 2018-02-27 NOTE — Telephone Encounter (Signed)
Returned patients call. Advised that per Dr. Charlett Blake when she was in the office for BP check she is supposed to be taking Maxzide only. Reminded patient to bring in medications when she comes for nurse visit next week.

## 2018-02-27 NOTE — Telephone Encounter (Signed)
Copied from [place] [phone]. Topic: General - Other >> Feb 10, 2018 10:19 AM Lennox Solders wrote: Reason for CRM: pt is calling she is taking triamterne-hctz 37.5-25 mg and hydrochlorothiazide 25 mg . Pt would like to clarify should she be taking both medication. Pt would need a refill on medication she should be taking. [facility] >> Feb 27, 2018 10:34 AM Oneta Rack wrote: Relation to pt: self  Phone: [phone] (H)  Reason for call:  Patient returning nurse "Santiago Glad" call regarding clarity regarding medication, please advise

## 2018-03-05 ENCOUNTER — Ambulatory Visit: Payer: MEDICARE

## 2018-03-05 VITALS — BP 121/65 | HR 80

## 2018-03-05 DIAGNOSIS — I10 Essential (primary) hypertension: Secondary | ICD-10-CM | POA: Diagnosis not present

## 2018-03-05 NOTE — Progress Notes (Signed)
Pre visit review using our clinic tool,if applicable. No additional management support is needed unless otherwise documented below in the visit note.   Patient in for medication review. Patient states she has started back taking her  Medications and now has her Triamterene-Hydrochlorothiazide 37.5-25 which she was not taking before.   Patient has Hemp Oil 750 mg with her medications, states she has bor pain but she does not take that often and will probably not replace once she had used it up.   Patient had all medications on her med list. We removed the Lisinopril 5 mg (d/c'd by another provider) and Hctz 25 mg which was d/c'd. Patient states she is no longer taking Flexwerill 5 mg.  BP today = 121/65  P = 80

## 2018-03-05 NOTE — Progress Notes (Signed)
Nursing blood check note reviewed. Agree with documention and plan.

## 2018-03-06 ENCOUNTER — Other Ambulatory Visit: Payer: Self-pay | Admitting: Family Medicine

## 2018-03-06 NOTE — Addendum Note (Signed)
Addended by: Penni Homans A on: 03/06/2018 01:55 PM   Modules accepted: Level of Service

## 2018-03-12 ENCOUNTER — Other Ambulatory Visit: Payer: MEDICARE

## 2018-03-16 ENCOUNTER — Ambulatory Visit: Payer: Self-pay | Admitting: *Deleted

## 2018-03-16 DIAGNOSIS — M545 Low back pain: Secondary | ICD-10-CM | POA: Diagnosis not present

## 2018-03-16 DIAGNOSIS — M4316 Spondylolisthesis, lumbar region: Secondary | ICD-10-CM | POA: Diagnosis not present

## 2018-03-16 NOTE — Telephone Encounter (Signed)
Pt called wanting to change her appointment to another day. She had gotten injection in her back today and was having a hard time moving. So did not want to not show up for her appointment in the morning. She did not want to be triaged, only to change her appointment. Done as requested.

## 2018-03-17 ENCOUNTER — Ambulatory Visit: Payer: MEDICARE | Admitting: Family Medicine

## 2018-03-20 ENCOUNTER — Other Ambulatory Visit: Payer: Self-pay | Admitting: Family Medicine

## 2018-03-31 ENCOUNTER — Ambulatory Visit (INDEPENDENT_AMBULATORY_CARE_PROVIDER_SITE_OTHER): Payer: MEDICARE | Admitting: Family Medicine

## 2018-03-31 VITALS — BP 124/72 | HR 88 | Temp 98.1°F | Resp 18 | Wt 175.0 lb

## 2018-03-31 DIAGNOSIS — E083299 Diabetes mellitus due to underlying condition with mild nonproliferative diabetic retinopathy without macular edema, unspecified eye: Secondary | ICD-10-CM

## 2018-03-31 DIAGNOSIS — I10 Essential (primary) hypertension: Secondary | ICD-10-CM | POA: Diagnosis not present

## 2018-03-31 DIAGNOSIS — E782 Mixed hyperlipidemia: Secondary | ICD-10-CM

## 2018-03-31 DIAGNOSIS — Z23 Encounter for immunization: Secondary | ICD-10-CM | POA: Diagnosis not present

## 2018-03-31 DIAGNOSIS — E6609 Other obesity due to excess calories: Secondary | ICD-10-CM

## 2018-03-31 NOTE — Assessment & Plan Note (Signed)
hgba1c acceptable, minimize simple carbs. Increase exercise as tolerated. Continue current meds 

## 2018-03-31 NOTE — Patient Instructions (Signed)
Shingrix is the new shingles shot 2 shots over Hypoglycemia Hypoglycemia is when the sugar (glucose) level in the blood is too low. Symptoms of low blood sugar may include:  Feeling: ? Hungry. ? Worried or nervous (anxious). ? Sweaty and clammy. ? Confused. ? Dizzy. ? Sleepy. ? Sick to your stomach (nauseous).  Having: ? A fast heartbeat. ? A headache. ? A change in your vision. ? Jerky movements that you cannot control (seizure). ? Nightmares. ? Tingling or no feeling (numbness) around the mouth, lips, or tongue.  Having trouble with: ? Talking. ? Paying attention (concentrating). ? Moving (coordination). ? Sleeping.  Shaking.  Passing out (fainting).  Getting upset easily (irritability).  Low blood sugar can happen to people who have diabetes and people who do not have diabetes. Low blood sugar can happen quickly, and it can be an emergency. Treating Low Blood Sugar Low blood sugar is often treated by eating or drinking something sugary right away. If you can think clearly and swallow safely, follow the 15:15 rule:  Take 15 grams of a fast-acting carb (carbohydrate). Some fast-acting carbs are: ? 1 tube of glucose gel. ? 3 sugar tablets (glucose pills). ? 6-8 pieces of hard candy. ? 4 oz (120 mL) of fruit juice. ? 4 oz (120 mL) of regular (not diet) soda.  Check your blood sugar 15 minutes after you take the carb.  If your blood sugar is still at or below 70 mg/dL (3.9 mmol/L), take 15 grams of a carb again.  If your blood sugar does not go above 70 mg/dL (3.9 mmol/L) after 3 tries, get help right away.  After your blood sugar goes back to normal, eat a meal or a snack within 1 hour.  Treating Very Low Blood Sugar If your blood sugar is at or below 54 mg/dL (3 mmol/L), you have very low blood sugar (severe hypoglycemia). This is an emergency. Do not wait to see if the symptoms will go away. Get medical help right away. Call your local emergency services (911 in  the [place] Do not drive yourself to the hospital. If you have very low blood sugar and you cannot eat or drink, you may need a glucagon shot (injection). A family member or friend should learn how to check your blood sugar and how to give you a glucagon shot. Ask your doctor if you need to have a glucagon shot kit at home. Follow these instructions at home: General instructions  Avoid any diets that cause you to not eat enough food. Talk with your doctor before you start any new diet.  Take over-the-counter and prescription medicines only as told by your doctor.  Limit alcohol to no more than 1 drink per day for nonpregnant women and 2 drinks per day for men. One drink equals 12 oz of beer, 5 oz of wine, or 1 oz of hard liquor.  Keep all follow-up visits as told by your doctor. This is important. If You Have Diabetes:   Make sure you know the symptoms of low blood sugar.  Always keep a source of sugar with you, such as: ? Sugar. ? Sugar tablets. ? Glucose gel. ? Fruit juice. ? Regular soda (not diet soda). ? Milk. ? Hard candy. ? Honey.  Take your medicines as told.  Follow your exercise and meal plan. ? Eat on time. Do not skip meals. ? Follow your sick day plan when you cannot eat or drink normally. Make this plan ahead of time with  your doctor.  Check your blood sugar as often as told by your doctor. Always check before and after exercise.  Share your diabetes care plan with: ? Your work or school. ? People you live with.  Check your pee (urine) for ketones: ? When you are sick. ? As told by your doctor.  Carry a card or wear jewelry that says you have diabetes. If You Have Low Blood Sugar From Other Causes:   Check your blood sugar as often as told by your doctor.  Follow instructions from your doctor about what you cannot eat or drink. Contact a doctor if:  You have trouble keeping your blood sugar in your target range.  You have low blood sugar often. Get  help right away if:  You still have symptoms after you eat or drink something sugary.  Your blood sugar is at or below 54 mg/dL (3 mmol/L).  You have jerky movements that you cannot control.  You pass out. These symptoms may be an emergency. Do not wait to see if the symptoms will go away. Get medical help right away. Call your local emergency services (911 in the [place] Do not drive yourself to the hospital. This information is not intended to replace advice given to you by your health care provider. Make sure you discuss any questions you have with your health care provider. Document Released: 10/09/2009 Document Revised: 12/21/2015 Document Reviewed: 08/18/2015 Elsevier Interactive Patient Education  2018 [employer].  2 to 6 months at pharmacy, check formualry coverage in January

## 2018-03-31 NOTE — Assessment & Plan Note (Signed)
Well controlled, no changes to meds. Encouraged heart healthy diet such as the DASH diet and exercise as tolerated.  °

## 2018-03-31 NOTE — Assessment & Plan Note (Signed)
Encouraged DASH diet, decrease po intake and increase exercise as tolerated. Needs 7-8 hours of sleep nightly. Avoid trans fats, eat small, frequent meals every 4-5 hours with lean proteins, complex carbs and healthy fats. Minimize simple carbs 

## 2018-03-31 NOTE — Assessment & Plan Note (Signed)
Encouraged heart healthy diet, increase exercise, avoid trans fats, consider a krill oil cap daily 

## 2018-04-05 NOTE — Progress Notes (Signed)
Subjective:    Patient ID: Beth Maldonado, female    DOB: 11-01-1945, 72 y.o.   MRN: [id number]  No chief complaint on file.   HPI Patient is in today for follow up. She feels well. No recent febrile illness or hospitalizations. No polyuria or polydipsia. Is trying to maintain a heat healthy diet. Denies CP/palp/SOB/HA/congestion/fevers/GI or GU c/o. Taking meds as prescribed  Past Medical History:  Diagnosis Date  . Acute bronchitis 03/29/2013  . Acute upper respiratory infection 11/27/2014  . Allergy   . Anemia 07/02/2015  . Arthritis   . Back pain    upper and lower last 20 years  . Cancer of left colon ([place]) 12/18/2015  . Depression with anxiety 06/27/2013  . Diabetes mellitus   . Hx of adenomatous colonic polyps 10/27/2015  . Hyperlipidemia   . Hypertension   . Insomnia 06/30/2015  . Medicare annual wellness visit, subsequent 03/23/2014  . Neck pain on left side 05/29/2014   hx of  . Neuromuscular disorder (HCC)    neuropathy feet  . Neuropathy   . Osteopenia   . Preventative health care 07/02/2015  . Tinnitus    RIGHT EAR ALL THE TIME    Past Surgical History:  Procedure Laterality Date  . BREAST ENHANCEMENT SURGERY Bilateral 1987   Reduction  . CATARACT EXTRACTION, BILATERAL  2011  . COLONOSCOPY  2017   colon cancer  . [place]  . KNEE ARTHROSCOPY Right 2003  . LAPAROSCOPIC SIGMOID COLECTOMY N/A 12/18/2015   Procedure: LAPAROSCOPIC SIGMOID COLECTOMY;  Surgeon: Alphonsa Overall, MD;  Location: [facility];  Service: General;  Laterality: N/A;  . TUBAL LIGATION  1970  . VAGINAL HYSTERECTOMY  1974   partial    Family History  Problem Relation Age of Onset  . Diabetes Mother   . Heart attack Father   . Heart disease Father        48 and 69 MI  . Diabetes Sister   . Sickle cell anemia Sister   . Arthritis Sister   . Hypertension Sister   . Diabetes Sister   . Thyroid disease Sister   . Arthritis Sister   . Glaucoma Sister   . Esophageal cancer  Neg Hx   . Stomach cancer Neg Hx   . Colon cancer Neg Hx     Social History   Socioeconomic History  . Marital status: Married    Spouse name: Not on file  . Number of children: 1  . Years of education: Not on file  . Highest education level: Not on file  Occupational History  . Occupation: retired  Scientific laboratory technician  . Financial resource strain: Not on file  . Food insecurity:    Worry: Not on file    Inability: Not on file  . Transportation needs:    Medical: Not on file    Non-medical: Not on file  Tobacco Use  . Smoking status: Former Smoker    Packs/day: 1.00    Years: 20.00    Pack years: 20.00    Types: Cigarettes    Start date: 10/03/1966    Last attempt to quit: 11/03/1986    Years since quitting: 31.4  . Smokeless tobacco: Never Used  . Tobacco comment: quit 28 years ago  Substance and Sexual Activity  . Alcohol use: Yes    Alcohol/week: 0.0 standard drinks    Comment: Rare- Special Occasional   . Drug use: No  . Sexual activity: Not on file  Comment: lives with husband, no dietary restrictions  Lifestyle  . Physical activity:    Days per week: Not on file    Minutes per session: Not on file  . Stress: Not on file  Relationships  . Social connections:    Talks on phone: Not on file    Gets together: Not on file    Attends religious service: Not on file    Active member of club or organization: Not on file    Attends meetings of clubs or organizations: Not on file    Relationship status: Not on file  . Intimate partner violence:    Fear of current or ex partner: Not on file    Emotionally abused: Not on file    Physically abused: Not on file    Forced sexual activity: Not on file  Other Topics Concern  . Not on file  Social History Narrative  . Not on file    Outpatient Medications Prior to Visit  Medication Sig Dispense Refill  . acetaminophen (TYLENOL) 500 MG tablet Take 500 mg by mouth every 6 (six) hours as needed.    Marland Kitchen aspirin EC 81 MG tablet  Take 81 mg by mouth daily.    Marland Kitchen atorvastatin (LIPITOR) 10 MG tablet Take 1 tablet (10 mg total) by mouth daily. 30 tablet 3  . Biotin 5000 MCG CAPS Take 5,000 mcg by mouth.    . Continuous Blood Gluc Receiver (FREESTYLE LIBRE 14 DAY READER) DEVI 1 Device by Does not apply route 3 (three) times daily as needed. 1 Device 0  . escitalopram (LEXAPRO) 20 MG tablet Take 1 tablet (20 mg total) by mouth daily. 90 tablet 0  . ferrous sulfate (GNP IRON) 325 (65 FE) MG tablet Take 325 mg by mouth daily with breakfast.    . gabapentin (NEURONTIN) 300 MG capsule TAKE 2 CAPSULES BY MOUTH 3  TIMES DAILY 180 capsule 0  . glucose blood (ONETOUCH VERIO) test strip Use four times daily to check blood sugar.  DX  E11.9 200 each 3  . hydrochlorothiazide (HYDRODIURIL) 25 MG tablet TAKE 1 TABLET BY MOUTH  DAILY 30 tablet 0  . hydrOXYzine (ATARAX/VISTARIL) 25 MG tablet TAKE 1 TABLET BY MOUTH TWO  TIMES DAILY 180 tablet 1  . insulin lispro (HUMALOG KWIKPEN) 100 UNIT/ML KiwkPen INJECT FIVE UNITS  SUBCUTANEOUSLY WITH  BREAKFAST AND DINNER AND 7  UNITS AT LUNCH 15 mL 2  . Insulin Pen Needle 32G X 4 MM MISC Use for insulin injection twice a day 250.00 100 each 2  . lidocaine (LIDODERM) 5 % Place 1 patch onto the skin daily. Remove & Discard patch within 12 hours or as directed by MD 30 patch 0  . lisinopril (PRINIVIL,ZESTRIL) 5 MG tablet Take 1 tablet (5 mg total) by mouth 2 (two) times daily. 90 tablet 3  . meloxicam (MOBIC) 7.5 MG tablet Take 7.5 mg by mouth daily.    . metFORMIN (GLUCOPHAGE) 500 MG tablet TAKE 2 TABLETS BY MOUTH TWO TIMES DAILY 120 tablet 0  . Multiple Vitamin (MULTIVITAMIN) tablet Take 1 tablet by mouth daily.    [name] DELICA LANCETS 62M MISC Use as directed to check blood sugar 4 times per day dx code E11.9 200 each 3  . TOUJEO SOLOSTAR 300 UNIT/ML SOPN INJECT SUBCUTANEOUSLY 22  UNITS DAILY 9 mL 1  . triamterene-hydrochlorothiazide (MAXZIDE-25) 37.5-25 MG tablet Take 1 tablet by mouth daily. 30  tablet 5  . celecoxib (CELEBREX) 200 MG capsule Take 1  capsule (200 mg total) by mouth 2 (two) times daily. 60 capsule 3  . cyclobenzaprine (FLEXERIL) 5 MG tablet TAKE 1 TABLET BY MOUTH ATBEDTIME AS NEEDED FOR MUSCLE SPASMS. 30 tablet 0  . escitalopram (LEXAPRO) 20 MG tablet TAKE 1 TABLET BY MOUTH  DAILY 90 tablet 0  . hydrochlorothiazide (HYDRODIURIL) 25 MG tablet take 1 tablet by mouth once daily 30 tablet 0  . Nutritional Supplements (QUINOA KALE & HEMP) LIQD Take by mouth.    Marland Kitchen 0.9 %  sodium chloride infusion      No facility-administered medications prior to visit.     Allergies  Allergen Reactions  . Penicillins Swelling and Other (See Comments)    tongue swelled per pt Has patient had a PCN reaction causing immediate rash, facial/tongue/throat swelling, SOB or lightheadedness with hypotension: yes Has patient had a PCN reaction causing severe rash involving mucus membranes or skin necrosis: no Has patient had a PCN reaction that required hospitalization no  Has patient had a PCN reaction occurring within the last 10 years: unsure If all of the above answers are "NO", then may proceed with Cephalosporin use.     Review of Systems  Constitutional: Negative for fever and malaise/fatigue.  HENT: Negative for congestion.   Eyes: Negative for blurred vision.  Respiratory: Negative for shortness of breath.   Cardiovascular: Negative for chest pain, palpitations and leg swelling.  Gastrointestinal: Negative for abdominal pain, blood in stool and nausea.  Genitourinary: Negative for dysuria and frequency.  Musculoskeletal: Negative for falls.  Skin: Negative for rash.  Neurological: Negative for dizziness, loss of consciousness and headaches.  Endo/Heme/Allergies: Negative for environmental allergies.  Psychiatric/Behavioral: Negative for depression. The patient is not nervous/anxious.        Objective:    Physical Exam  Constitutional: She is oriented to person, place, and  time. She appears well-developed and well-nourished. No distress.  HENT:  Head: Normocephalic and atraumatic.  Nose: Nose normal.  Eyes: Right eye exhibits no discharge. Left eye exhibits no discharge.  Neck: Normal range of motion. Neck supple.  Cardiovascular: Normal rate and regular rhythm.  No murmur heard. Pulmonary/Chest: Effort normal and breath sounds normal.  Abdominal: Soft. Bowel sounds are normal. There is no tenderness.  Musculoskeletal: She exhibits no edema.  Neurological: She is alert and oriented to person, place, and time.  Skin: Skin is warm and dry.  Psychiatric: She has a normal mood and affect.  Nursing note and vitals reviewed.   BP 124/72 (BP Location: Left Arm, Patient Position: Sitting, Cuff Size: Normal)   Pulse 88   Temp 98.1 F (36.7 C) (Oral)   Resp 18   Wt 175 lb (79.4 kg)   SpO2 97%   BMI 33.07 kg/m  Wt Readings from Last 3 Encounters:  03/31/18 175 lb (79.4 kg)  01/28/18 174 lb (78.9 kg)  01/14/18 173 lb (78.5 kg)     Lab Results  Component Value Date   WBC 10.6 (H) 12/15/2017   HGB 11.2 (L) 12/15/2017   HCT 33.6 (L) 12/15/2017   PLT 598.0 (H) 12/15/2017   GLUCOSE 132 (H) 02/26/2018   CHOL 162 12/15/2017   TRIG 138.0 12/15/2017   HDL 51.50 12/15/2017   LDLDIRECT 109.0 09/20/2015   LDLCALC 83 12/15/2017   ALT 18 02/26/2018   AST 18 02/26/2018   NA 140 02/26/2018   K 3.9 02/26/2018   CL 104 02/26/2018   CREATININE 1.63 (H) 02/26/2018   BUN 26 (H) 02/26/2018   CO2  25 02/26/2018   TSH 1.90 12/15/2017   HGBA1C 8.2 (H) 12/15/2017   MICROALBUR 19.3 (H) 12/18/2017    Lab Results  Component Value Date   TSH 1.90 12/15/2017   Lab Results  Component Value Date   WBC 10.6 (H) 12/15/2017   HGB 11.2 (L) 12/15/2017   HCT 33.6 (L) 12/15/2017   MCV 89.8 12/15/2017   PLT 598.0 (H) 12/15/2017   Lab Results  Component Value Date   NA 140 02/26/2018   K 3.9 02/26/2018   CHLORIDE 103 10/30/2016   CO2 25 02/26/2018   GLUCOSE 132  (H) 02/26/2018   BUN 26 (H) 02/26/2018   CREATININE 1.63 (H) 02/26/2018   BILITOT 0.6 02/26/2018   ALKPHOS 58 02/26/2018   AST 18 02/26/2018   ALT 18 02/26/2018   PROT 7.3 02/26/2018   ALBUMIN 4.3 02/26/2018   CALCIUM 9.9 02/26/2018   ANIONGAP 11 10/30/2016   EGFR 44 (L) 10/30/2016   GFR 39.87 (L) 02/26/2018   Lab Results  Component Value Date   CHOL 162 12/15/2017   Lab Results  Component Value Date   HDL 51.50 12/15/2017   Lab Results  Component Value Date   LDLCALC 83 12/15/2017   Lab Results  Component Value Date   TRIG 138.0 12/15/2017   Lab Results  Component Value Date   CHOLHDL 3 12/15/2017   Lab Results  Component Value Date   HGBA1C 8.2 (H) 12/15/2017       Assessment & Plan:   Problem List Items Addressed This Visit    Diabetes mellitus ([place])    hgba1c acceptable, minimize simple carbs. Increase exercise as tolerated. Continue current meds      Hypertension    Well controlled, no changes to meds. Encouraged heart healthy diet such as the DASH diet and exercise as tolerated.       Hyperlipidemia    Encouraged heart healthy diet, increase exercise, avoid trans fats, consider a krill oil cap daily      Obesity    Encouraged DASH diet, decrease po intake and increase exercise as tolerated. Needs 7-8 hours of sleep nightly. Avoid trans fats, eat small, frequent meals every 4-5 hours with lean proteins, complex carbs and healthy fats. Minimize simple carbs       Other Visit Diagnoses    Needs flu shot    -  Primary   Relevant Orders   Flu vaccine HIGH DOSE PF (Fluzone High dose) (Completed)      I have discontinued Barbarann Ehlers Rathgeber's cyclobenzaprine, celecoxib, and [place]. I am also having her maintain her Insulin Pen Needle, multivitamin, lidocaine, aspirin EC, ONETOUCH DELICA LANCETS 44W, glucose blood, FREESTYLE LIBRE 14 DAY READER, escitalopram, TOUJEO SOLOSTAR, lisinopril, insulin lispro, atorvastatin,  triamterene-hydrochlorothiazide, hydrOXYzine, meloxicam, acetaminophen, Biotin, ferrous sulfate, gabapentin, hydrochlorothiazide, and metFORMIN. We will stop administering sodium chloride.  No orders of the defined types were placed in this encounter.    Penni Homans, MD

## 2018-04-08 ENCOUNTER — Other Ambulatory Visit: Payer: Self-pay | Admitting: Family Medicine

## 2018-04-13 ENCOUNTER — Ambulatory Visit: Payer: Self-pay

## 2018-04-13 NOTE — Telephone Encounter (Signed)
Called pt who had reported complaints of low BS. Pt states BS are normally in the 120s Pt was unable to say what it was today and would not check it for me, but stated she felt fine today. She stated that over the past 2 weeks she has had episodes of low sugars even stating that she thinks she passed out once. Pt takes several blood sugar medications but is taking them different from the orders. She reported that she take her humalog all at once 14 units. Pt was educated. but  needs reinforcement. She reports being under a lot of stress with some life issues that are ongoing. She states it is difficult to sleep and she is having cold like symptoms. Pt is worried that the cold symptoms are caused by her BP medication. She states she has been to see Dr Charlett Blake recently and needs to have lab work but she failed to check out properly so the labs were never done. Pt is taking musinex for the runny nose scratchy throat symptoms. Care advice per protocol given to pt.  Patient verbalized understanding of all instructions.  Reason for Disposition . [1] Morning (before breakfast) blood glucose < 80 mg/dL (4.4 mmol/L) AND [2] more than once in past week  Answer Assessment - Initial Assessment Questions 1. SYMPTOMS: "What symptoms are you concerned about?"     Low blood sugar 2. ONSET:  "When did the symptoms start?"     2 weeks very regular 3. BLOOD GLUCOSE: "What is your blood glucose level?"      no 4. USUAL RANGE: "What is your blood glucose level usually?" (e.g., usual fasting morning value, usual evening value)     120 5. TYPE 1 or 2:  "Do you know what type of diabetes you have?"  (e.g., Type 1, Type 2, Gestational; doesn't know)      Type two 6. INSULIN: "Do you take insulin?" "What type of insulin(s) do you use? What is the mode of delivery? (syringe, pen (e.g., injection or  pump)      24 u trajeo daily and humalog 7 before each meal and takes 14 units 7. DIABETES PILLS: "Do you take any pills for your  diabetes?"     Metformin twice per day 8. OTHER SYMPTOMS: "Do you have any symptoms?" (e.g., fever, frequent urination, difficulty breathing, vomiting)     Stress cold like symptoms itchy throat 9. LOW BLOOD GLUCOSE TREATMENT: "What have you done so far to treat the low blood glucose level?"     Just eat 10. FOOD: "When did you last eat or drink?"       Breakfast at 0830 11. ALONE: "Are you alone right now or is someone with you?"        Sugar is fine now 12. PREGNANCY: "Is there any chance you are pregnant?" "When was your last menstrual period?"       no  Protocols used: DIABETES - LOW BLOOD SUGAR-A-AH

## 2018-04-13 NOTE — Telephone Encounter (Signed)
Patient has appt w/you tomorrow

## 2018-04-14 ENCOUNTER — Encounter: Payer: Self-pay | Admitting: Family Medicine

## 2018-04-14 ENCOUNTER — Telehealth: Payer: Self-pay | Admitting: Family Medicine

## 2018-04-14 ENCOUNTER — Ambulatory Visit (INDEPENDENT_AMBULATORY_CARE_PROVIDER_SITE_OTHER): Payer: MEDICARE | Admitting: Family Medicine

## 2018-04-14 VITALS — BP 160/86 | HR 90 | Temp 98.4°F | Resp 18 | Wt 175.0 lb

## 2018-04-14 DIAGNOSIS — Z6833 Body mass index (BMI) 33.0-33.9, adult: Secondary | ICD-10-CM | POA: Diagnosis not present

## 2018-04-14 DIAGNOSIS — I10 Essential (primary) hypertension: Secondary | ICD-10-CM | POA: Diagnosis not present

## 2018-04-14 DIAGNOSIS — M47816 Spondylosis without myelopathy or radiculopathy, lumbar region: Secondary | ICD-10-CM | POA: Diagnosis not present

## 2018-04-14 MED ORDER — DOXYCYCLINE HYCLATE 100 MG PO TABS: 100.0000 mg | ORAL_TABLET | Freq: Two times a day (BID) | ORAL | 0 refills | Status: DC

## 2018-04-14 MED ORDER — BENZONATATE 100 MG PO CAPS: 100.0000 mg | ORAL_CAPSULE | Freq: Three times a day (TID) | ORAL | 0 refills | Status: DC

## 2018-04-14 NOTE — Progress Notes (Signed)
Patient left without being seen.

## 2018-04-14 NOTE — Telephone Encounter (Signed)
Patient has a cough that keeps her up at night, sinus congestion,

## 2018-04-14 NOTE — Telephone Encounter (Signed)
Since he sugars have been low have her decrease her Tuojeo by 2 units daily and come back in if she does not stat feeling bette.

## 2018-04-14 NOTE — Telephone Encounter (Signed)
Spoke with patient about medications being called in,  she stated she forgot to let me know that she passed out the other day, she stated her blood sugars are very low I asked her how low are they, she said she does not know because she has not been checking them, she states she is tired and has a lot going on right now. I advised her to go to the ER if she feels worse or if she feels like there is more than what is gooing on. , She states "im not going to no ER, ill just take the medicine that she gives me, I am also not thinking right, I think im losing it"   Once again I offered her an appointment with Dr. Charlett Blake today she says she will just wait and take the medication that she is giving, and she did not want to go to the ER for further evaluation.

## 2018-04-15 ENCOUNTER — Telehealth: Payer: Self-pay | Admitting: *Deleted

## 2018-04-15 ENCOUNTER — Ambulatory Visit: Payer: MEDICARE | Admitting: Medical

## 2018-04-15 NOTE — Telephone Encounter (Signed)
Thanks, I think a transfer is a good idea since the drive has gotten harder. Hopefully they have an opening

## 2018-04-15 NOTE — Telephone Encounter (Signed)
Copied from [place] [phone]. Topic: Appointment Scheduling - Scheduling Inquiry for Clinic >> Apr 15, 2018 12:17 PM Margot Ables wrote: Reason for CRM: Pt calling to request transfer from Dr. Charlett Blake to Caesar Chestnut, NP or Jodi Mourning, NP at the [facility] office. She states [place] is closer to her home and with her health issues she would like to stay closer to home. Please advise if ok to transfer care.

## 2018-04-15 NOTE — Telephone Encounter (Signed)
OK to transfer care to [facility] if anyone is taking patients. Please check with them and arrange if possible

## 2018-04-15 NOTE — Telephone Encounter (Signed)
Spoke with patient about dropping her medication, she yelled " im tired you think I am stupid and crazy, I am not as crazy as you think, I am very smart and you think I am dumb I know what to do when my sugars are low" I explained to the patient that no one had called her stupid nor dumb I was advising her of the recommendations of the physician. She continued to let me know hat she was handled completely wrong on yesterday and we did not care about how she felt, and because she was having car trouble we made her leave.   Patient was advised to either wait for provider yesterday or go to the ER or come at a later time patient refused all options. After she returned home the antibiotic and cough medicine was called in for patient because she c/o of runny nose, cough and congestion in the office before she left. When patient was in the office she never c/o of low blood sugars, its when she was home and I called her about her medications that was being sent in then she mentioned her blood sugars where running low, I asked her could she take her BS for me she stated "NO, I just know it is low" I offered her another appointment she refused, then hung up.   Patient wants to transfer care to [facility] office

## 2018-04-15 NOTE — Telephone Encounter (Signed)
Spoke with patient today, she had an appointment with Percell Miller today 04/15/18, she stated her son made the appointment bc he was worried about her, she stated she did not have a way to come here [id number].  offered appt with Dr. Charlett Blake this coming week due to her sugars dropping she refused appointment and stated she wanted to see a provider at [facility], I did advise her there could be a wait for a new patient to establish care appointment she  Says" I do not care I have been dealing with this for this long im might as well keep dealing with it".

## 2018-04-16 NOTE — Telephone Encounter (Signed)
Can you assist in getting Pt established with provider accepting new/transfer Pts?

## 2018-04-16 NOTE — Telephone Encounter (Signed)
Duplicate message. 

## 2018-04-19 NOTE — Telephone Encounter (Signed)
Beth Maldonado has no physicians taking new patients or transfers, based on her address I believe [facility] and [facility] are closer and both have new physicians with good access.

## 2018-04-20 NOTE — Telephone Encounter (Signed)
Patient has TOC appt scheduled with [facility].

## 2018-04-20 NOTE — Telephone Encounter (Signed)
She can schedule with me.

## 2018-04-21 ENCOUNTER — Ambulatory Visit: Payer: MEDICARE | Admitting: Family Medicine

## 2018-05-06 ENCOUNTER — Encounter: Payer: MEDICARE | Admitting: Family Medicine

## 2018-05-08 ENCOUNTER — Encounter: Payer: Self-pay | Admitting: Family Medicine

## 2018-05-08 ENCOUNTER — Ambulatory Visit (INDEPENDENT_AMBULATORY_CARE_PROVIDER_SITE_OTHER): Payer: MEDICARE | Admitting: Family Medicine

## 2018-05-08 VITALS — BP 138/80 | HR 77 | Temp 98.2°F | Ht 61.0 in | Wt 179.8 lb

## 2018-05-08 DIAGNOSIS — I10 Essential (primary) hypertension: Secondary | ICD-10-CM

## 2018-05-08 DIAGNOSIS — Z794 Long term (current) use of insulin: Secondary | ICD-10-CM

## 2018-05-08 DIAGNOSIS — E083299 Diabetes mellitus due to underlying condition with mild nonproliferative diabetic retinopathy without macular edema, unspecified eye: Secondary | ICD-10-CM | POA: Diagnosis not present

## 2018-05-08 DIAGNOSIS — Z7689 Persons encountering health services in other specified circumstances: Secondary | ICD-10-CM

## 2018-05-08 LAB — BASIC METABOLIC PANEL
BUN: 22 mg/dL (ref 6–23)
CHLORIDE: 103 meq/L (ref 96–112)
CO2: 30 mEq/L (ref 19–32)
CREATININE: 1.49 mg/dL — AB (ref 0.40–1.20)
Calcium: 9.5 mg/dL (ref 8.4–10.5)
GFR: 44.2 mL/min — ABNORMAL LOW (ref >60.00)
GLUCOSE: 214 mg/dL — AB (ref 70–99)
POTASSIUM: 4.5 meq/L (ref 3.5–5.1)
Sodium: 140 mEq/L (ref 135–145)

## 2018-05-08 LAB — HEMOGLOBIN A1C: HEMOGLOBIN A1C: 8.4 % — AB (ref 4.6–6.5)

## 2018-05-08 NOTE — Patient Instructions (Signed)
Decrease humalog AM dose from 7 units to 5 units. Continue with 7 units in evening Decrease metformin from 2 tabs to 1 tab (500mg ) in AM and continue 2 tabs 1000mg  in PM Follow-up in 2 weeks

## 2018-05-08 NOTE — Progress Notes (Signed)
Beth Maldonado is a 72 y.o. female here as a new patient to our office to establish care. She was previously following with Dr. Randel Pigg at [facility].  She believes she is due for labs and also has questions about how to use her continuous glucose monitor. She has it but has not used it. She states she was never shown how to apply it. She states her BS has been dropping lately. She has a syncopal episode about 2-3 weeks ago after she felt her blood sugar dropping. She states these episodes are occurring almost daily. She gets dizzy, diaphoretic, weak. She has never checked her BS during these events. They happen in the AM, usually mid-AM, and never in the afternoon or evening. She does eat breakfast around 7:30pm. She takes her Toujeo 16 units qAM (she has decreased this on her own over the past couple of months from 22 units) and humalog 7 units qAM and 7 units qPM. She also takes metformin 500mg  2 tabs po BID.  No recent illness. No change in her diet.   Pt also has h/o HTN and is taking med as prescribed. She does not check BP at home. No HA, vision changes, CP, SOB, LE edema.  Past Medical History:  Diagnosis Date  . Acute bronchitis 03/29/2013  . Acute upper respiratory infection 11/27/2014  . Allergy   . Anemia 07/02/2015  . Arthritis   . Back pain    upper and lower last 20 years  . Cancer of left colon ([place]) 12/18/2015  . Depression with anxiety 06/27/2013  . Diabetes mellitus   . Hx of adenomatous colonic polyps 10/27/2015  . Hyperlipidemia   . Hypertension   . Insomnia 06/30/2015  . Medicare annual wellness visit, subsequent 03/23/2014  . Neck pain on left side 05/29/2014   hx of  . Neuromuscular disorder (HCC)    neuropathy feet  . Neuropathy   . Osteopenia   . Preventative health care 07/02/2015  . Tinnitus    RIGHT EAR ALL THE TIME   Past Surgical History:  Procedure Laterality Date  . BREAST ENHANCEMENT SURGERY Bilateral 1987   Reduction  . CATARACT EXTRACTION,  BILATERAL  2011  . COLONOSCOPY  2017   colon cancer  . [place]  . KNEE ARTHROSCOPY Right 2003  . LAPAROSCOPIC SIGMOID COLECTOMY N/A 12/18/2015   Procedure: LAPAROSCOPIC SIGMOID COLECTOMY;  Surgeon: Alphonsa Overall, MD;  Location: [facility];  Service: General;  Laterality: N/A;  . TUBAL LIGATION  1970  . VAGINAL HYSTERECTOMY  1974   partial   Social History   Socioeconomic History  . Marital status: Married    Spouse name: Not on file  . Number of children: 1  . Years of education: Not on file  . Highest education level: Not on file  Occupational History  . Occupation: retired  Scientific laboratory technician  . Financial resource strain: Not on file  . Food insecurity:    Worry: Not on file    Inability: Not on file  . Transportation needs:    Medical: Not on file    Non-medical: Not on file  Tobacco Use  . Smoking status: Former Smoker    Packs/day: 1.00    Years: 20.00    Pack years: 20.00    Types: Cigarettes    Start date: 10/03/1966    Last attempt to quit: 11/03/1986    Years since quitting: 31.5  . Smokeless tobacco: Never Used  . Tobacco comment:  quit 28 years ago  Substance and Sexual Activity  . Alcohol use: Yes    Alcohol/week: 0.0 standard drinks    Comment: Rare- Special Occasional   . Drug use: No  . Sexual activity: Not on file    Comment: lives with husband, no dietary restrictions  Lifestyle  . Physical activity:    Days per week: Not on file    Minutes per session: Not on file  . Stress: Not on file  Relationships  . Social connections:    Talks on phone: Not on file    Gets together: Not on file    Attends religious service: Not on file    Active member of club or organization: Not on file    Attends meetings of clubs or organizations: Not on file    Relationship status: Not on file  . Intimate partner violence:    Fear of current or ex partner: Not on file    Emotionally abused: Not on file    Physically abused: Not on file    Forced sexual  activity: Not on file  Other Topics Concern  . Not on file  Social History Narrative  . Not on file   Family History  Problem Relation Age of Onset  . Diabetes Mother   . Heart attack Father   . Heart disease Father        22 and 12 MI  . Diabetes Sister   . Sickle cell anemia Sister   . Arthritis Sister   . Hypertension Sister   . Diabetes Sister   . Thyroid disease Sister   . Arthritis Sister   . Glaucoma Sister   . Esophageal cancer Neg Hx   . Stomach cancer Neg Hx   . Colon cancer Neg Hx    Outpatient Encounter Medications as of 05/08/2018  Medication Sig Note  . acetaminophen (TYLENOL) 500 MG tablet Take 500 mg by mouth every 6 (six) hours as needed.   Marland Kitchen aspirin EC 81 MG tablet Take 81 mg by mouth daily.   Marland Kitchen atorvastatin (LIPITOR) 10 MG tablet Take 1 tablet (10 mg total) by mouth daily.   . benzonatate (TESSALON) 100 MG capsule Take 1 capsule (100 mg total) by mouth 3 (three) times daily.   . Biotin 5000 MCG CAPS Take 5,000 mcg by mouth.   . Continuous Blood Gluc Receiver (FREESTYLE LIBRE 14 DAY READER) DEVI 1 Device by Does not apply route 3 (three) times daily as needed.   Marland Kitchen escitalopram (LEXAPRO) 20 MG tablet Take 1 tablet (20 mg total) by mouth daily.   . ferrous sulfate (GNP IRON) 325 (65 FE) MG tablet Take 325 mg by mouth daily with breakfast.   . gabapentin (NEURONTIN) 300 MG capsule TAKE 2 CAPSULES BY MOUTH 3  TIMES DAILY   . glucose blood (ONETOUCH VERIO) test strip Use four times daily to check blood sugar.  DX  E11.9   . hydrochlorothiazide (HYDRODIURIL) 25 MG tablet TAKE 1 TABLET BY MOUTH  DAILY   . hydrOXYzine (ATARAX/VISTARIL) 25 MG tablet TAKE 1 TABLET BY MOUTH TWO  TIMES DAILY   . insulin lispro (HUMALOG KWIKPEN) 100 UNIT/ML KiwkPen INJECT FIVE UNITS  SUBCUTANEOUSLY WITH  BREAKFAST AND DINNER AND 7  UNITS AT LUNCH   . Insulin Pen Needle 32G X 4 MM MISC Use for insulin injection twice a day 250.00   . lidocaine (LIDODERM) 5 % Place 1 patch onto the skin  daily. Remove & Discard patch within 12 hours  or as directed by MD   . lisinopril (PRINIVIL,ZESTRIL) 5 MG tablet Take 1 tablet (5 mg total) by mouth 2 (two) times daily.   . meloxicam (MOBIC) 7.5 MG tablet Take 7.5 mg by mouth daily. 03/05/2018: Dr. Kathyrn Sheriff  . metFORMIN (GLUCOPHAGE) 500 MG tablet TAKE 2 TABLETS BY MOUTH TWO TIMES DAILY   . Multiple Vitamin (MULTIVITAMIN) tablet Take 1 tablet by mouth daily.   [name] DELICA LANCETS 63K MISC Use as directed to check blood sugar 4 times per day dx code E11.9   . TOUJEO SOLOSTAR 300 UNIT/ML SOPN INJECT SUBCUTANEOUSLY 22  UNITS DAILY   . triamterene-hydrochlorothiazide (MAXZIDE-25) 37.5-25 MG tablet Take 1 tablet by mouth daily.   Marland Kitchen doxycycline (VIBRA-TABS) 100 MG tablet Take 1 tablet (100 mg total) by mouth 2 (two) times daily. (Patient not taking: Reported on 05/08/2018)    No facility-administered encounter medications on file as of 05/08/2018.    Review of Systems  Constitutional: Negative for chills, fever and malaise/fatigue.  HENT: Negative for congestion, ear discharge, ear pain, sinus pain and sore throat.   Eyes: Negative for blurred vision and double vision.  Respiratory: Negative for cough, shortness of breath and wheezing.   Cardiovascular: Negative for chest pain, palpitations and leg swelling.  Gastrointestinal: Negative for abdominal pain, constipation, diarrhea, nausea and vomiting.  Genitourinary: Negative for dysuria, frequency and urgency.  Musculoskeletal: Negative for joint pain and myalgias.  Skin: Negative for itching and rash.  Neurological: Positive for dizziness (associated with low BS episodes) and weakness (associated with low BS episode).  Psychiatric/Behavioral: Negative for depression. The patient is not nervous/anxious and does not have insomnia.      Allergies  Allergen Reactions  . Penicillins Swelling and Other (See Comments)    tongue swelled per pt Has patient had a PCN reaction causing immediate  rash, facial/tongue/throat swelling, SOB or lightheadedness with hypotension: yes Has patient had a PCN reaction causing severe rash involving mucus membranes or skin necrosis: no Has patient had a PCN reaction that required hospitalization no  Has patient had a PCN reaction occurring within the last 10 years: unsure If all of the above answers are "NO", then may proceed with Cephalosporin use.    BP 138/80 (BP Location: Left Arm, Patient Position: Sitting, Cuff Size: Normal)   Pulse 77   Temp 98.2 F (36.8 C) (Oral)   Ht 5\' 1"  (1.549 m)   Wt 179 lb 12.8 oz (81.6 kg)   SpO2 97%   BMI 33.97 kg/m   General appearance: alert, cooperative, appears stated age and moderately obese Neck: no adenopathy, no carotid bruit, no JVD, supple, symmetrical, trachea midline and thyroid not enlarged, symmetric, no tenderness/mass/nodules Lungs: clear to auscultation bilaterally Heart: regular rate and rhythm and S1, S2 normal Abdomen: soft, non-tender; bowel sounds normal; no masses,  no organomegaly Extremities: extremities normal, atraumatic, no cyanosis or edema Pulses: 2+ and symmetric Neurologic: Grossly normal   A/P: 1. 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 Encounter to establish care with new doctor   3. Essential hypertension    - pt due for A1C and BMP - will do this today - HTN at goal - cont current med regimen as well as low salt diet and recommended pt try to walk regularly (30 min 3-5x/wk) - pt is having what are very likely hypoglycemia episodes, occurring only in mid-AM for the  past few weeks. She recently in the past 2-3 days decreased her Toujeo from 18 to 16 units so will cont with 16 units qAM at this time. Will decrease Humalog from 7 to 4 units qAM and cont with 7 units qPM. She will also decrease her AM metformin from 1000mg   to 500mg  and will cont 1000mg  qPM - continuous glucose monitor applied today, pt did not have reader/monitor with her today but will look at home - will f/u in 2 weeks or sooner PRN  I spent 24min with the patient today and greater than 50% was spent in counseling, coordination of care, education

## 2018-05-22 ENCOUNTER — Telehealth: Payer: Self-pay | Admitting: Internal Medicine

## 2018-05-22 NOTE — Telephone Encounter (Signed)
Patient states she has not had a BM all week and is requesting to speak with the nurse.

## 2018-05-22 NOTE — Telephone Encounter (Signed)
Spoke with patient -she verbalized small formed "raisinette" BM x 1 week; despite daily Miralax; patient advised to increase Miralax to BID and increase PO fluid intake;  Patient to call back next week if no improvement for appt with APP;

## 2018-05-25 DIAGNOSIS — E119 Type 2 diabetes mellitus without complications: Secondary | ICD-10-CM | POA: Diagnosis not present

## 2018-05-25 DIAGNOSIS — Z794 Long term (current) use of insulin: Secondary | ICD-10-CM | POA: Diagnosis not present

## 2018-05-25 DIAGNOSIS — H524 Presbyopia: Secondary | ICD-10-CM | POA: Diagnosis not present

## 2018-05-25 LAB — HM DIABETES EYE EXAM

## 2018-05-28 ENCOUNTER — Ambulatory Visit: Payer: MEDICARE | Admitting: Family Medicine

## 2018-06-03 ENCOUNTER — Encounter: Payer: Self-pay | Admitting: Family Medicine

## 2018-06-03 ENCOUNTER — Ambulatory Visit (INDEPENDENT_AMBULATORY_CARE_PROVIDER_SITE_OTHER): Payer: MEDICARE | Admitting: Family Medicine

## 2018-06-03 VITALS — BP 132/70 | HR 91 | Temp 98.0°F | Ht 61.0 in | Wt 181.5 lb

## 2018-06-03 DIAGNOSIS — E083299 Diabetes mellitus due to underlying condition with mild nonproliferative diabetic retinopathy without macular edema, unspecified eye: Secondary | ICD-10-CM

## 2018-06-03 DIAGNOSIS — Z794 Long term (current) use of insulin: Secondary | ICD-10-CM | POA: Diagnosis not present

## 2018-06-03 DIAGNOSIS — Z1239 Encounter for other screening for malignant neoplasm of breast: Secondary | ICD-10-CM | POA: Diagnosis not present

## 2018-06-03 MED ORDER — BENZONATATE 100 MG PO CAPS: 100.0000 mg | ORAL_CAPSULE | Freq: Three times a day (TID) | ORAL | 0 refills | Status: DC | PRN

## 2018-06-03 NOTE — Progress Notes (Signed)
Chief Complaint  Patient presents with  . Follow-up    HPI: *Beth Maldonado is a 72 y.o. female here for DM follow-up. She was seen by me for the first time on 05/08/18. She had a continuous blood glucose monitor (acutally part of one, she did not have the meter/reader) with her but had never applied/used it. At that [place] she also complained of hypoglycemic episodes occurring almost daily. She was taking Toujeo 16u qAM and humalog 7u BID. As mentioned above, she was not checking her BS consistently or during these episodes. I decreased her Toujeo to 16u and changed her qAM humalog dose to 4u, continuing the 7u qPM. We did an A1C and other labs at that [place]. Since 05/08/18, her A1C came back at 8.4, which is up from 8.2 in 11/2017. The patient attributes this to stress, as she is dealing with settling her SIL's estate, her husband passed away, and her dog passed away. She is eating more carbs and sweets and finds it hard to cook for just herself. She eats a "good breakfast" and does get plenty of veggies.   Since our OV on 05/08/18, pt reports not using the continuous blood glucose monitor. She states it came off and she did not put another one on. She does not want to use this and plans to get glucometer, lancets, test strips from [employer]. She has not had any hypoglycemia episodes since she was last here in the office. She is happy with the new insulin dosing as documented above. She also states today that she forgot to tell me at last OV, that she was forgetting to take her nighttime insulin x 2-3 mo.   Since last OV, she did have her annual eye exam at [facility]. She states ophthalmology will send report to me. I have not yet received this.  Pt requests referral for mammo.  Lab Results  Component Value Date   HGBA1C 8.4 (H) 05/08/2018   Lab Results  Component Value Date   MICROALBUR 19.3 (H) 12/18/2017   Lab Results  Component Value Date   CREATININE 1.49 (H) 05/08/2018   Lab  Results  Component Value Date   CHOL 162 12/15/2017   HDL 51.50 12/15/2017   LDLCALC 83 12/15/2017   LDLDIRECT 109.0 09/20/2015   TRIG 138.0 12/15/2017   CHOLHDL 3 12/15/2017    The 10-year ASCVD risk score Mikey Bussing DC Jr., et al., 2013) is: 25.2%   Values used to calculate the score:     Age: 27 years     Sex: Female     Is Non-Hispanic African American: Yes     Diabetic: Yes     Tobacco smoker: No     Systolic Blood Pressure: 947 mmHg     Is BP treated: Yes     HDL Cholesterol: 51.5 mg/dL     Total Cholesterol: 162 mg/dL  Last eye exam: 04/2018  Past Medical History:  Diagnosis Date  . Acute bronchitis 03/29/2013  . Acute upper respiratory infection 11/27/2014  . Allergy   . Anemia 07/02/2015  . Arthritis   . Back pain    upper and lower last 20 years  . Cancer of left colon ([place]) 12/18/2015  . Depression with anxiety 06/27/2013  . Diabetes mellitus   . Hx of adenomatous colonic polyps 10/27/2015  . Hyperlipidemia   . Hypertension   . Insomnia 06/30/2015  . Medicare annual wellness visit, subsequent 03/23/2014  . Neck pain on left side  05/29/2014   hx of  . Neuromuscular disorder (HCC)    neuropathy feet  . Neuropathy   . Osteopenia   . Preventative health care 07/02/2015  . Tinnitus    RIGHT EAR ALL THE TIME    Past Surgical History:  Procedure Laterality Date  . BREAST ENHANCEMENT SURGERY Bilateral 1987   Reduction  . CATARACT EXTRACTION, BILATERAL  2011  . COLONOSCOPY  2017   colon cancer  . [place]  . KNEE ARTHROSCOPY Right 2003  . LAPAROSCOPIC SIGMOID COLECTOMY N/A 12/18/2015   Procedure: LAPAROSCOPIC SIGMOID COLECTOMY;  Surgeon: Alphonsa Overall, MD;  Location: [facility];  Service: General;  Laterality: N/A;  . TUBAL LIGATION  1970  . VAGINAL HYSTERECTOMY  1974   partial    Social History   Socioeconomic History  . Marital status: Married    Spouse name: Not on file  . Number of children: 1  . Years of education: Not on file  . Highest  education level: Not on file  Occupational History  . Occupation: retired  Scientific laboratory technician  . Financial resource strain: Not on file  . Food insecurity:    Worry: Not on file    Inability: Not on file  . Transportation needs:    Medical: Not on file    Non-medical: Not on file  Tobacco Use  . Smoking status: Former Smoker    Packs/day: 1.00    Years: 20.00    Pack years: 20.00    Types: Cigarettes    Start date: 10/03/1966    Last attempt to quit: 11/03/1986    Years since quitting: 31.6  . Smokeless tobacco: Never Used  . Tobacco comment: quit 28 years ago  Substance and Sexual Activity  . Alcohol use: Yes    Alcohol/week: 0.0 standard drinks    Comment: Rare- Special Occasional   . Drug use: No  . Sexual activity: Not on file    Comment: lives with husband, no dietary restrictions  Lifestyle  . Physical activity:    Days per week: Not on file    Minutes per session: Not on file  . Stress: Not on file  Relationships  . Social connections:    Talks on phone: Not on file    Gets together: Not on file    Attends religious service: Not on file    Active member of club or organization: Not on file    Attends meetings of clubs or organizations: Not on file    Relationship status: Not on file  . Intimate partner violence:    Fear of current or ex partner: Not on file    Emotionally abused: Not on file    Physically abused: Not on file    Forced sexual activity: Not on file  Other Topics Concern  . Not on file  Social History Narrative  . Not on file    Family History  Problem Relation Age of Onset  . Diabetes Mother   . Heart attack Father   . Heart disease Father        25 and 59 MI  . Diabetes Sister   . Sickle cell anemia Sister   . Arthritis Sister   . Hypertension Sister   . Diabetes Sister   . Thyroid disease Sister   . Arthritis Sister   . Glaucoma Sister   . Esophageal cancer Neg Hx   . Stomach cancer Neg Hx   . Colon cancer Neg Hx  Immunization  History  Administered Date(s) Administered  . Influenza Split 05/21/2011, 06/18/2012  . Influenza Whole 05/12/2013  . Influenza, High Dose Seasonal PF 04/23/2016, 05/15/2017, 03/31/2018  . Influenza,inj,Quad PF,6+ Mos 03/21/2014  . Influenza-Unspecified 04/29/2015  . Pneumococcal Conjugate-13 03/21/2014  . Pneumococcal Polysaccharide-23 05/21/2011    Outpatient Encounter Medications as of 06/03/2018  Medication Sig Note  . acetaminophen (TYLENOL) 500 MG tablet Take 500 mg by mouth every 6 (six) hours as needed.   Marland Kitchen aspirin EC 81 MG tablet Take 81 mg by mouth daily.   Marland Kitchen atorvastatin (LIPITOR) 10 MG tablet Take 1 tablet (10 mg total) by mouth daily.   . benzonatate (TESSALON) 100 MG capsule Take 1 capsule (100 mg total) by mouth 3 (three) times daily as needed for cough.   . Biotin 5000 MCG CAPS Take 5,000 mcg by mouth.   . Continuous Blood Gluc Receiver (FREESTYLE LIBRE 14 DAY READER) DEVI 1 Device by Does not apply route 3 (three) times daily as needed.   Marland Kitchen escitalopram (LEXAPRO) 20 MG tablet Take 1 tablet (20 mg total) by mouth daily.   . ferrous sulfate (GNP IRON) 325 (65 FE) MG tablet Take 325 mg by mouth daily with breakfast.   . gabapentin (NEURONTIN) 300 MG capsule TAKE 2 CAPSULES BY MOUTH 3  TIMES DAILY   . glucose blood (ONETOUCH VERIO) test strip Use four times daily to check blood sugar.  DX  E11.9   . insulin lispro (HUMALOG KWIKPEN) 100 UNIT/ML KiwkPen INJECT FIVE UNITS  SUBCUTANEOUSLY WITH  BREAKFAST AND DINNER AND 7  UNITS AT LUNCH   . Insulin Pen Needle 32G X 4 MM MISC Use for insulin injection twice a day 250.00   . lidocaine (LIDODERM) 5 % Place 1 patch onto the skin daily. Remove & Discard patch within 12 hours or as directed by MD   . lisinopril (PRINIVIL,ZESTRIL) 5 MG tablet Take 1 tablet (5 mg total) by mouth 2 (two) times daily.   . meloxicam (MOBIC) 7.5 MG tablet Take 7.5 mg by mouth daily. 03/05/2018: Dr. Kathyrn Sheriff  . metFORMIN (GLUCOPHAGE) 500 MG tablet TAKE 2  TABLETS BY MOUTH TWO TIMES DAILY   . Multiple Vitamin (MULTIVITAMIN) tablet Take 1 tablet by mouth daily.   [name] DELICA LANCETS 32I MISC Use as directed to check blood sugar 4 times per day dx code E11.9   . TOUJEO SOLOSTAR 300 UNIT/ML SOPN INJECT SUBCUTANEOUSLY 22  UNITS DAILY   . triamterene-hydrochlorothiazide (MAXZIDE-25) 37.5-25 MG tablet Take 1 tablet by mouth daily.   . [DISCONTINUED] benzonatate (TESSALON) 100 MG capsule Take 1 capsule (100 mg total) by mouth 3 (three) times daily.   . [DISCONTINUED] hydrochlorothiazide (HYDRODIURIL) 25 MG tablet TAKE 1 TABLET BY MOUTH  DAILY   . [DISCONTINUED] hydrOXYzine (ATARAX/VISTARIL) 25 MG tablet TAKE 1 TABLET BY MOUTH TWO  TIMES DAILY   . [DISCONTINUED] doxycycline (VIBRA-TABS) 100 MG tablet Take 1 tablet (100 mg total) by mouth 2 (two) times daily. (Patient not taking: Reported on 05/08/2018)    No facility-administered encounter medications on file as of 06/03/2018.      ROS: Gen: no fever, chills  Skin: no rash, itching ENT: no ear pain, ear drainage, nasal congestion, rhinorrhea, sinus pressure, sore throat Eyes: no blurry vision, double vision Resp: + cough,  wheeze,SOB CV: no CP, palpitations, LE edema,  GI: no heartburn, n/v/d/c, abd pain GU: no dysuria, urgency, frequency, hematuria  MSK: no joint pain, myalgias; + back pain Neuro: no dizziness, headache, weakness,  vertigo    Allergies  Allergen Reactions  . Penicillins Swelling and Other (See Comments)    tongue swelled per pt Has patient had a PCN reaction causing immediate rash, facial/tongue/throat swelling, SOB or lightheadedness with hypotension: yes Has patient had a PCN reaction causing severe rash involving mucus membranes or skin necrosis: no Has patient had a PCN reaction that required hospitalization no  Has patient had a PCN reaction occurring within the last 10 years: unsure If all of the above answers are "NO", then may proceed with Cephalosporin use.       BP 132/70 (BP Location: Left Arm, Patient Position: Sitting, Cuff Size: Normal)   Pulse 91   Temp 98 F (36.7 C) (Oral)   Ht 5\' 1"  (1.549 m)   Wt 181 lb 8 oz (82.3 kg)   SpO2 95%   BMI 34.29 kg/m   Physical Exam  Constitutional: She is oriented to person, place, and time. She appears well-developed and well-nourished. No distress.  Cardiovascular: Normal rate, regular rhythm, normal heart sounds and intact distal pulses.  Pulmonary/Chest: Effort normal and breath sounds normal. No stridor. No respiratory distress. She has no wheezes.  Musculoskeletal: She exhibits no edema.  Neurological: She is alert and oriented to person, place, and time.  Skin: Skin is warm and dry.  Psychiatric: She has a normal mood and affect. Her behavior is normal. Thought content normal.     A/P:  1. 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 I stressed how important it is to have BS readings so I can adjust insulin in most appropriate manner in order to achieve good glycemic control while avoiding hypoglycemia episodes - pt to cont current meds/dosages - f/u in 3 mo or sooner PRN  2. Screening for breast cancer - MM DIGITAL SCREENING BILATERAL; Future

## 2018-06-03 NOTE — Patient Instructions (Addendum)
Get glucometer and check blood sugar 1. Fasting in AM and 2. 2 hours after lunch or dinner Keep a log of these values and bring to next appt in 3 mo  Allergies as of 06/03/2018      Reactions   Penicillins Swelling, Other (See Comments)   tongue swelled per pt Has patient had a PCN reaction causing immediate rash, facial/tongue/throat swelling, SOB or lightheadedness with hypotension: yes Has patient had a PCN reaction causing severe rash involving mucus membranes or skin necrosis: no Has patient had a PCN reaction that required hospitalization no  Has patient had a PCN reaction occurring within the last 10 years: unsure If all of the above answers are "NO", then may proceed with Cephalosporin use.      Medication List        Accurate as of 06/03/18  1:29 PM. Always use your most recent med list.          acetaminophen 500 MG tablet Commonly known as:  TYLENOL Take 500 mg by mouth every 6 (six) hours as needed.   aspirin EC 81 MG tablet Take 81 mg by mouth daily.   atorvastatin 10 MG tablet Commonly known as:  LIPITOR Take 1 tablet (10 mg total) by mouth daily.   benzonatate 100 MG capsule Commonly known as:  TESSALON Take 1 capsule (100 mg total) by mouth 3 (three) times daily as needed for cough.   Biotin 5000 MCG Caps Take 5,000 mcg by mouth.   escitalopram 20 MG tablet Commonly known as:  LEXAPRO Take 1 tablet (20 mg total) by mouth daily.   FREESTYLE LIBRE 14 DAY READER Devi 1 Device by Does not apply route 3 (three) times daily as needed.   gabapentin 300 MG capsule Commonly known as:  NEURONTIN TAKE 2 CAPSULES BY MOUTH 3  TIMES DAILY   glucose blood test strip Use four times daily to check blood sugar.  DX  E11.9   GNP IRON 325 (65 FE) MG tablet Generic drug:  ferrous sulfate Take 325 mg by mouth daily with breakfast.   insulin lispro 100 UNIT/ML KiwkPen Commonly known as:  HUMALOG INJECT FIVE UNITS  SUBCUTANEOUSLY WITH  BREAKFAST AND DINNER AND 7   UNITS AT LUNCH   Insulin Pen Needle 32G X 4 MM Misc Use for insulin injection twice a day 250.00   lidocaine 5 % Commonly known as:  LIDODERM Place 1 patch onto the skin daily. Remove & Discard patch within 12 hours or as directed by MD   lisinopril 5 MG tablet Commonly known as:  PRINIVIL,ZESTRIL Take 1 tablet (5 mg total) by mouth 2 (two) times daily.   meloxicam 7.5 MG tablet Commonly known as:  MOBIC Take 7.5 mg by mouth daily.   metFORMIN 500 MG tablet Commonly known as:  GLUCOPHAGE TAKE 2 TABLETS BY MOUTH TWO TIMES DAILY   multivitamin tablet Take 1 tablet by mouth daily.   ONETOUCH DELICA LANCETS 18E Misc Use as directed to check blood sugar 4 times per day dx code E11.9   TOUJEO SOLOSTAR 300 UNIT/ML Sopn Generic drug:  Insulin Glargine (1 Unit Dial) INJECT SUBCUTANEOUSLY 22  UNITS DAILY   triamterene-hydrochlorothiazide 37.5-25 MG tablet Commonly known as:  MAXZIDE-25 Take 1 tablet by mouth daily.

## 2018-06-08 ENCOUNTER — Encounter: Payer: Self-pay | Admitting: Family Medicine

## 2018-06-08 ENCOUNTER — Telehealth: Payer: Self-pay | Admitting: Family Medicine

## 2018-06-08 NOTE — Telephone Encounter (Signed)
Copied from [place] [id number]. Topic: General - Other >> Jun 08, 2018 10:00 AM Parke Poisson wrote: Reason for CRM: Steffanie Dunn. From [facility] called requesting that pt's med list be faxed to her.Pt would like to get all medications through them.Phone [phone]

## 2018-06-12 NOTE — Telephone Encounter (Signed)
Please help the pt with this.

## 2018-06-12 NOTE — Telephone Encounter (Signed)
Patient called and said that [facility] not heard [facility] about her getting on the simple dose program " pill - pack ". Please advise, patient is getting low on medications.

## 2018-06-12 NOTE — Telephone Encounter (Signed)
Pt medication list faxed to number requested

## 2018-06-15 ENCOUNTER — Other Ambulatory Visit: Payer: Self-pay

## 2018-06-16 MED ORDER — MELOXICAM 7.5 MG PO TABS: 7.5000 mg | ORAL_TABLET | Freq: Every day | ORAL | 1 refills | Status: DC

## 2018-06-16 MED ORDER — INSULIN GLARGINE (1 UNIT DIAL) 300 UNIT/ML ~~LOC~~ SOPN: 22.0000 [IU] | PEN_INJECTOR | Freq: Every day | SUBCUTANEOUS | 1 refills | Status: DC

## 2018-06-16 MED ORDER — ESCITALOPRAM OXALATE 20 MG PO TABS: 20.0000 mg | ORAL_TABLET | Freq: Every day | ORAL | 1 refills | Status: DC

## 2018-06-16 MED ORDER — GABAPENTIN 300 MG PO CAPS: ORAL_CAPSULE | ORAL | 1 refills | Status: DC

## 2018-06-16 MED ORDER — ATORVASTATIN CALCIUM 10 MG PO TABS: 10.0000 mg | ORAL_TABLET | Freq: Every day | ORAL | 1 refills | Status: DC

## 2018-06-16 MED ORDER — LISINOPRIL 5 MG PO TABS: 5.0000 mg | ORAL_TABLET | Freq: Two times a day (BID) | ORAL | 1 refills | Status: DC

## 2018-06-16 MED ORDER — METFORMIN HCL 500 MG PO TABS: ORAL_TABLET | ORAL | 1 refills | Status: DC

## 2018-06-16 MED ORDER — TRIAMTERENE-HCTZ 37.5-25 MG PO TABS: 1.0000 | ORAL_TABLET | Freq: Every day | ORAL | 1 refills | Status: DC

## 2018-06-16 NOTE — Telephone Encounter (Signed)
Rx's sent to pharm as req/thx dmf

## 2018-06-16 NOTE — Addendum Note (Signed)
Addended by: Marrion Coy on: 06/16/2018 01:06 PM   Modules accepted: Orders

## 2018-06-16 NOTE — Telephone Encounter (Addendum)
Caller name: Manon Hilding   Relation to pt: [facility]  Call back number: [phone] fax # [phone]    Reason for call:  Medication List received due to representative from [facility] doing an advance search.    Requesting all prescriptions please fax to [phone] or E-Prescribe to     [id number] [place], [place] - [address] AT [facility]  [address] [place] [address]  Phone: [phone] Fax: [phone]  Not a 24 hour pharmacy; exact hours not known.

## 2018-06-18 DIAGNOSIS — M545 Low back pain: Secondary | ICD-10-CM | POA: Diagnosis not present

## 2018-06-18 DIAGNOSIS — M4316 Spondylolisthesis, lumbar region: Secondary | ICD-10-CM | POA: Diagnosis not present

## 2018-06-18 NOTE — Telephone Encounter (Signed)
[phone] CB#  Fax# [phone]  Also states the full quantity was not on the prescription Insulin Glargine, 1 Unit Dial, (TOUJEO SOLOSTAR) 300 UNIT/ML SOPN.

## 2018-06-18 NOTE — Telephone Encounter (Signed)
Marzetta Board is calling [facility] and states the patient is stating she is taking the following medications and they are not on the list they received. She would like to verify.   Gabapentin Aspirin 81  Iron  Tylenol

## 2018-06-29 ENCOUNTER — Other Ambulatory Visit: Payer: Self-pay | Admitting: Family Medicine

## 2018-06-29 NOTE — Telephone Encounter (Signed)
Copied from [place] [id number]. Topic: Quick Communication - Rx Refill/Question >> Jun 29, 2018 11:30 AM Charlynn Court wrote: Medication: gabapentin (NEURONTIN) 300 MG capsule  Has the patient contacted their pharmacy? Yes.   (Agent: If no, request that the patient contact the pharmacy for the refill.) (Agent: If yes, when and what did the pharmacy advise?)  Preferred Pharmacy (with phone number or street name): [id number] [place], [place] [phone] (Phone) [phone] (Fax)    Agent: Please be advised that RX refills may take up to 3 business days. We ask that you follow-up with your pharmacy.

## 2018-07-03 ENCOUNTER — Other Ambulatory Visit: Payer: Self-pay | Admitting: Family Medicine

## 2018-07-03 MED ORDER — FERROUS SULFATE 325 (65 FE) MG PO TABS: 325.0000 mg | ORAL_TABLET | Freq: Every day | ORAL | 3 refills | Status: DC

## 2018-07-03 MED ORDER — ASPIRIN EC 81 MG PO TBEC: 81.0000 mg | DELAYED_RELEASE_TABLET | Freq: Every day | ORAL | 0 refills | Status: DC

## 2018-07-03 MED ORDER — ACETAMINOPHEN 500 MG PO TABS: 500.0000 mg | ORAL_TABLET | Freq: Four times a day (QID) | ORAL | 0 refills | Status: DC | PRN

## 2018-07-03 NOTE — Telephone Encounter (Signed)
Requested medication (s) are due for refill today: yes to all 3 meds   Requested medication (s) are on the active medication list: yes- historical med  Last refill:  Aspirin: 12/06/15   Ferrous sulfate: 03/05/18     Acetaminophen: 03/05/18  Future visit scheduled: yes   Notes to clinic:  All 3 medications are historical meds.   Requested Prescriptions  Pending Prescriptions Disp Refills   aspirin EC 81 MG tablet      Sig: Take 1 tablet (81 mg total) by mouth daily.     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     Endocrinology:  Minerals - Iron Supplementation Failed - 07/03/2018 11:46 AM      Failed - HGB in normal range and within 360 days    Hemoglobin  Date Value Ref  Range Status  12/15/2017 11.2 (L) 12.0 - 15.0 g/dL Final   HGB  Date Value Ref Range Status  10/30/2016 11.5 (L) 11.6 - 15.9 g/dL Final         Failed - HCT in normal range and within 360 days    HCT  Date Value Ref Range Status  12/15/2017 33.6 (L) 36.0 - 46.0 % Final  10/30/2016 35.9 34.8 - 46.6 % Final         Failed - RBC in normal range and within 360 days    RBC  Date Value Ref Range Status  12/15/2017 3.74 (L) 3.87 - 5.11 Mil/uL Final         Failed - Fe (serum) in normal range and within 360 days    Iron  Date Value Ref Range Status  10/30/2016 70 41 - 142 ug/dL Final   %SAT  Date Value Ref Range Status  10/30/2016 22 21 - 57 % Final         Failed - Ferritin in normal range and within 360 days    Ferritin  Date Value Ref Range Status  10/30/2016 21 9 - 269 ng/ml Final         Passed - Valid encounter within  last 12 months    Recent Outpatient Visits          1 month ago 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, with long-term current use of insulin, unspecified laterality [facility])   [facility] [place], [place] K, DO   1 month ago 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, with long-term current use of insulin, macular edema presence unspecified, unspecified laterality [facility])   [facility] [place], [place] K, DO   2 months ago Essential hypertension   [facility] at [place], MD   3 months ago Needs flu shot   [facility] at [place], MD   4 months ago Essential hypertension   [facility]ivist at [place], MD      Future Appointments            In 2 months Cirigliano, Garvin Fila, DO [facility] [place], [facility]          acetaminophen (TYLENOL) 500 MG tablet 30 tablet      Sig: Take 1 tablet (500 mg total) by mouth every 6 (six) hours as needed.     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, with long-term current use of insulin, macular edema presence unspecified, unspecified laterality [facility])   [facility] [place], [place] K, DO   2 months ago Essential hypertension   [facility] at [place], MD   3 months ago Needs flu shot   Archivist at [place], MD   4 months ago Essential hypertension   [facility] at [place], Bonnita Levan, MD      Future Appointments            In 2 months Cirigliano, Garvin Fila, DO [facility], [place]         Refused Prescriptions Disp Refills   gabapentin (NEURONTIN) 300 MG capsule 540 capsule 1    Sig: Take 2tid     Neurology: Anticonvulsants - gabapentin Passed - 07/03/2018 11:46 AM      Passed - Valid encounter within last 12 months    Recent Outpatient Visits          1 month ago 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, with long-term current use of insulin, unspecified laterality ([place])   [facility] [place], [place] K, DO   1 month ago 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

## 2018-07-03 NOTE — Telephone Encounter (Signed)
Copied from [place] [id number]. Topic: Quick Communication - Rx Refill/Question >> Jul 03, 2018 10:29 AM Keene Breath wrote: Medication: acetaminophen (TYLENOL) 500 MG tablet / aspirin EC 81 MG tablet / ferrous sulfate (GNP IRON) 325 (65 FE) MG tablet / gabapentin (NEURONTIN) 300 MG capsule  Patient called to request a refill for the above medications  Preferred Pharmacy (with phone number or street name): [id number] [place], [place] - [place] [phone] (Phone) [phone] (Fax)

## 2018-08-11 ENCOUNTER — Telehealth: Payer: Self-pay

## 2018-08-11 DIAGNOSIS — F4321 Adjustment disorder with depressed mood: Secondary | ICD-10-CM

## 2018-08-11 NOTE — Telephone Encounter (Signed)
Dr. Bryan Lemma , please advise . Thank you

## 2018-08-11 NOTE — Telephone Encounter (Signed)
Copied from [place] [id number]. Topic: General - Other >> Aug 11, 2018  2:23 PM Virl Axe D wrote: Reason for CRM: Pt called and stated she had spoken with Dr. Loletha Grayer regarding cpeaking to someone in office and having counseling. Pt has had 3 back to back deaths in her family and would like to be contacted about how to start seeing someone to cope. Please reach out to pt with information. CB#[phone]

## 2018-08-12 NOTE — Addendum Note (Signed)
Addended by: Louie Casa on: 08/12/2018 10:37 AM   Modules accepted: Orders

## 2018-08-12 NOTE — Telephone Encounter (Signed)
I will place referral for [facility] counseling here at our office with [facility]. Please call pt to let her know

## 2018-08-19 ENCOUNTER — Other Ambulatory Visit: Payer: Self-pay | Admitting: Family Medicine

## 2018-08-19 MED ORDER — ESCITALOPRAM OXALATE 20 MG PO TABS: 20.0000 mg | ORAL_TABLET | Freq: Every day | ORAL | 1 refills | Status: DC

## 2018-08-19 MED ORDER — METFORMIN HCL 500 MG PO TABS: ORAL_TABLET | ORAL | 0 refills | Status: DC

## 2018-08-19 MED ORDER — HYDROXYZINE HCL 25 MG PO TABS: ORAL_TABLET | ORAL | 2 refills | Status: DC

## 2018-08-19 MED ORDER — ATORVASTATIN CALCIUM 10 MG PO TABS: 10.0000 mg | ORAL_TABLET | Freq: Every day | ORAL | 1 refills | Status: DC

## 2018-08-19 MED ORDER — TRIAMTERENE-HCTZ 37.5-25 MG PO TABS: 1.0000 | ORAL_TABLET | Freq: Every day | ORAL | 1 refills | Status: DC

## 2018-08-19 NOTE — Telephone Encounter (Signed)
Call placed to the pt.  Stated she needs to have Metformin, Atorvastatin, Escitalopram, Triamterene-HCTZ, and Hydroxyzine refilled.  Reported she wants to change to [employer].  Discussed with pt. change in her Metformin dose, as was noted in 05/08/18 office note; advised she is to take Metformin 500 mg 1 tab in AM and 2 tabs in PM, per Dr. Vivia Ewing note.  Also advised pt. The Hydroxyzine is not on her active medication list.  Will have this reviewed by Dr. Bryan Lemma.  Pt. Verb. Understanding.

## 2018-08-19 NOTE — Telephone Encounter (Signed)
Noted last refill on Triamterene-HCTZ 37.5-25 mg; 06/16/18; #90; refill x 1.  Noted pt. Is requesting to have this changed to new pharmacy.  Call placed to pt. To further discuss.  Left vm. For pt. to request the new pharmacy to contact current pharm. And request transfer of the Rx.  Also, questioned if pt. Is ready for refill prior to February. Requested pt. to return call to office to discuss further.

## 2018-08-19 NOTE — Addendum Note (Signed)
Addended by: Ronnald Nian on: 08/19/2018 02:18 PM   Modules accepted: Orders

## 2018-08-19 NOTE — Telephone Encounter (Signed)
Pt. Aware that hydroxyzine was sent to her pharmacy.

## 2018-08-19 NOTE — Telephone Encounter (Signed)
Copied from [place]. Topic: Quick Communication - Rx Refill/Question >> Aug 19, 2018 11:21 AM Selinda Flavin B, NT wrote: **Patient states that she is switching pharmacies and was told that she needed to contact her dr for refills**  Medication: triamterene-hydrochlorothiazide (MAXZIDE-25) 37.5-25 MG tablet  hydrochlorothiazide (HYDRODIURIL) 25 MG tablet   Has the patient contacted their pharmacy? Yes.   (Agent: If no, request that the patient contact the pharmacy for the refill.) (Agent: If yes, when and what did the pharmacy advise?)  Preferred Pharmacy (with phone number or street name): [facility] # [place], [place]  Agent: Please be advised that RX refills may take up to 3 business days. We ask that you follow-up with your pharmacy.

## 2018-08-19 NOTE — Telephone Encounter (Signed)
Dr. Bryan Lemma please advise. This pt. Is requesting hydroxyzine for itching. Not on her medication list. Pt. Reports she used for years.

## 2018-08-19 NOTE — Telephone Encounter (Signed)
Requested Prescriptions  Pending Prescriptions Disp Refills  . metFORMIN (GLUCOPHAGE) 500 MG tablet 270 tablet 0    Sig: Take 1 tablet (500 mg) in AM, and take 2 tablets (1000 mg) in PM     Endocrinology:  Diabetes - Biguanides Failed - 08/19/2018  1:52 PM      Failed - Cr in normal range and within 360 days    Creatinine  Date Value Ref Range Status  10/30/2016 1.4 (H) 0.6 - 1.1 mg/dL Final   Creatinine, Ser  Date Value Ref Range Status  05/08/2018 1.49 (H) 0.40 - 1.20 mg/dL Final         Failed - HBA1C is between 0 and 7.9 and within 180 days    Hemoglobin A1c  Date Value Ref Range Status  04/03/2016 6.6 (H) 4.8 - 5.6 % Final    Comment:             Pre-diabetes: 5.7 - 6.4          Diabetes: >6.4          Glycemic control for adults with diabetes: <7.0    Hgb A1c MFr Bld  Date Value Ref Range Status  05/08/2018 8.4 (H) 4.6 - 6.5 % Final    Comment:    Glycemic Control Guidelines for People with Diabetes:Non Diabetic:  <6%Goal of Therapy: <7%Additional Action Suggested:  >8%          Failed - eGFR in normal range and within 360 days    GFR, Est African American  Date Value Ref Range Status  02/17/2015 48 (L) mL/min Final   GFR calc Af Amer  Date Value Ref Range Status  12/20/2015 50 (L) >60 mL/min Final    Comment:    (NOTE) The eGFR has been calculated using the CKD EPI equation. This calculation has not been validated in all clinical situations. eGFR's persistently <60 mL/min signify possible Chronic Kidney Disease.    GFR, Est Non African American  Date Value Ref Range Status  02/17/2015 42 (L) mL/min Final    Comment:      The estimated GFR is a calculation valid for adults (>=72 years old) that uses the CKD-EPI algorithm to adjust for age and sex. It is   not to be used for children, pregnant women, hospitalized patients,    patients on dialysis, or with rapidly changing kidney function. According to the NKDEP, eGFR >89 is normal, 60-89 shows  mild impairment, 30-59 shows moderate impairment, 15-29 shows severe impairment and <15 is ESRD.      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 atorvastatin (LIPITOR) 10 MG tablet 90 tablet 1    Sig: Take 1 tablet (10 mg total) by mouth daily.     Cardiovascular:  Antilipid - Statins Passed - 08/19/2018  1:52 PM      Passed - Total Cholesterol in normal range and within 360 days    Cholesterol  Date Value Ref Range Status  12/15/2017 162 0 - 200 mg/dL Final    Comment:    ATP III Classification       Desirable:  < 200 mg/dL               Borderline High:  200 - 239 mg/dL          High:  > = 240 mg/dL         Passed - LDL in normal range and within 360 days    LDL Cholesterol  Date Value Ref Range Status   12/15/2017 83 0 - 99 mg/dL Final         Passed - HDL in normal range and within 360 days    HDL  Date Value Ref Range Status  12/15/2017 51.50 >39.00 mg/dL Final         Passed - Triglycerides in normal range and within 360 days    Triglycerides  Date Value Ref Range Status  12/15/2017 138.0 0.0 - 149.0 mg/dL Final    Comment:    Normal:  <150 mg/dLBorderline High:  150 - 199 mg/dL         Passed - Patient is not pregnant      Passed - Valid encounter within last 12 months    Recent Outpatient Visits          2 months ago 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, with long-term current use of insulin, unspecified laterality ([place])   [facility] [place], [place] K, DO   3 months ago 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 escitalopram (LEXAPRO) 20 MG tablet 90 tablet 1    Sig: Take 1 tablet (20 mg total) by mouth daily.     Psychiatry:  Antidepressants - SSRI Passed - 08/19/2018  1:52 PM      Passed - Completed PHQ-2 or PHQ-9 in the last  360 days.      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 triamterene-hydrochlorothiazide (MAXZIDE-25) 37.5-25 MG tablet 90 tablet 1    Sig: Take 1 tablet by mouth daily.     Cardiovascular: Diuretic Combos Failed - 08/19/2018  1:52 PM      Failed - Cr in normal range and within 360 days    Creatinine  Date Value Ref Range Status  10/30/2016 1.4 (H) 0.6 - 1.1 mg/dL Final   Creatinine, Ser  Date Value Ref Range Status  05/08/2018 1.49 (H) 0.40 - 1.20 mg/dL Final         Passed - K in normal range and within 360 days    Potassium  Date Value Ref Range Status  05/08/2018 4.5 3.5 - 5.1 mEq/L Final  10/30/2016 3.8 3.5 - 5.1 mEq/L Final         Passed - Na in normal range and within 360 days    Sodium  Date Value Ref Range Status  05/08/2018 140 135 - 145 mEq/L Final  10/30/2016 142 136 - 145 mEq/L Final         Passed - Ca in normal range and within 360 days    Calcium  Date Value Ref Range Status  05/08/2018 9.5 8.4 - 10.5 mg/dL Final  10/30/2016 10.2 8.4 - 10.4 mg/dL Final         Passed - Last BP in normal range    BP Readings from Last 1 Encounters:  06/03/18 132/70          Passed - Valid encounter within last 6 months    Recent Outpatient Visits          2 months ago 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, with long-term current use of insulin, unspecified laterality ([place])   [facility] [place], [place] K, DO   3 months ago 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, [place]         per lab result note 04/2018, Dr. Bryan Lemma reported Kidney labs were stable.

## 2018-08-19 NOTE — Telephone Encounter (Signed)
Pt. Requesting a refill on Hydroxyzine for itching.  Noted this is not on her active medication list.  Will send message to PCP.

## 2018-08-19 NOTE — Telephone Encounter (Signed)
Rx sent to pharm on file:

## 2018-08-20 NOTE — Telephone Encounter (Signed)
Please advise 

## 2018-08-24 ENCOUNTER — Other Ambulatory Visit: Payer: Self-pay | Admitting: Family Medicine

## 2018-09-02 ENCOUNTER — Ambulatory Visit: Payer: MEDICARE | Admitting: Family Medicine

## 2018-09-04 ENCOUNTER — Encounter: Payer: Self-pay | Admitting: Family Medicine

## 2018-09-04 ENCOUNTER — Ambulatory Visit (INDEPENDENT_AMBULATORY_CARE_PROVIDER_SITE_OTHER): Payer: MEDICARE | Admitting: Family Medicine

## 2018-09-04 VITALS — BP 142/76 | HR 85 | Temp 98.1°F | Ht 61.0 in | Wt 176.2 lb

## 2018-09-04 DIAGNOSIS — I10 Essential (primary) hypertension: Secondary | ICD-10-CM | POA: Diagnosis not present

## 2018-09-04 DIAGNOSIS — E782 Mixed hyperlipidemia: Secondary | ICD-10-CM | POA: Diagnosis not present

## 2018-09-04 DIAGNOSIS — F418 Other specified anxiety disorders: Secondary | ICD-10-CM | POA: Diagnosis not present

## 2018-09-04 DIAGNOSIS — E083299 Diabetes mellitus due to underlying condition with mild nonproliferative diabetic retinopathy without macular edema, unspecified eye: Secondary | ICD-10-CM

## 2018-09-04 DIAGNOSIS — Z794 Long term (current) use of insulin: Secondary | ICD-10-CM | POA: Diagnosis not present

## 2018-09-04 LAB — POCT GLYCOSYLATED HEMOGLOBIN (HGB A1C)
HbA1c POC (<> result, manual entry): 10 % (ref 4.0–5.6)
Hemoglobin A1C: 10 % — AB (ref 4.0–5.6)

## 2018-09-04 NOTE — Patient Instructions (Addendum)
Increase metformin back to 2 tabs (1000mg ) in AM and 2 tabs (1000mg ) in PM Take Toujeo 24 units at night Continue humalog 7 units in AM and 7 units in PM Check your blood sugar twice per day - fasting in AM (before you eat or drink anything) and then 2 hrs or so after lunch. Keep  A record of this to bring to your next appt with me in 2 months

## 2018-09-04 NOTE — Progress Notes (Signed)
Chief Complaint  Patient presents with  . Follow-up    f/u with DM/ doesnt have readings/ haven't been checkin    HPI: *Beth Maldonado is a 73 y.o. female here for DM follow-up. She was last seen by me in 05/2018 and her last A1C in 04/2018 was 8.4. There is some confusion as to what she is taking vs what was recommended at last OV. She is taking Toujeo 22u and humalog 7u qAM and 7u qPM. She is taking 500mg  metformin in AM and 1000mg  in PM. This is different than what was recommended to her by me at last OV (see AVS and my progress note). She has not been checking her BS at home.   Pt does notcheck BS at home. Readings: n/a Hypoglycemia/Hypergylcemic episodes: none  Diet: she is not monitoring carb or sugar intake. She has not been cooking for herself. Exercise: none  Urine microalbumin done 11/2017, CMP done 02/2018, lipid panel done 11/2017. She is on 81mg  ASA, lipitor (statin), and lisinopril (ACEi).  Pt states she "just doesn't feel like doing anything". She has appt with [facility] on 2018-10-07.  She is dealing with death in family, fighting amongst in-laws for estate settlement. She is on lexapro 20mg  daily. She is eating but not cooking for herself and not making good, healthy food choices.   She denies HA, lightheadedness, CP, SOB, n/v/d/c, LE edema. She denies suicidal thoughts.  Lab Results  Component Value Date   HGBA1C 10.0 (A) 09/04/2018   HGBA1C 10 09/04/2018   Lab Results  Component Value Date   MICROALBUR 19.3 (H) 12/18/2017   Lab Results  Component Value Date   CREATININE 1.49 (H) 05/08/2018   Lab Results  Component Value Date   CHOL 162 12/15/2017   HDL 51.50 12/15/2017   LDLCALC 83 12/15/2017   LDLDIRECT 109.0 09/20/2015   TRIG 138.0 12/15/2017   CHOLHDL 3 12/15/2017    The 10-year ASCVD risk score Mikey Bussing DC Jr., et al., 2013) is: 28.2%   Values used to calculate the score:     Age: 54 years     Sex: Female     Is Non-Hispanic African American: Yes  Diabetic: Yes     Tobacco smoker: No     Systolic Blood Pressure: 767 mmHg     Is BP treated: Yes     HDL Cholesterol: 51.5 mg/dL     Total Cholesterol: 162 mg/dL  Last foot exam: will do today Last eye exam: 04/2018 ([facility])  Pt had referral placed for mammogram but she has not had that done.   Past Medical History:  Diagnosis Date  . Acute bronchitis 03/29/2013  . Acute upper respiratory infection 11/27/2014  . Allergy   . Anemia 07/02/2015  . Arthritis   . Back pain    upper and lower last 20 years  . Cancer of left colon ([place]) 12/18/2015  . Depression with anxiety 06/27/2013  . Diabetes mellitus   . Hx of adenomatous colonic polyps 10/27/2015  . Hyperlipidemia   . Hypertension   . Insomnia 06/30/2015  . Medicare annual wellness visit, subsequent 03/23/2014  . Neck pain on left side 05/29/2014   hx of  . Neuromuscular disorder (HCC)    neuropathy feet  . Neuropathy   . Osteopenia   . Preventative health care 07/02/2015  . Tinnitus    RIGHT EAR ALL THE TIME    Past Surgical History:  Procedure Laterality Date  . BREAST ENHANCEMENT SURGERY  Bilateral 1987   Reduction  . CATARACT EXTRACTION, BILATERAL  2011  . COLONOSCOPY  2017   colon cancer  . [place]  . KNEE ARTHROSCOPY Right 2003  . LAPAROSCOPIC SIGMOID COLECTOMY N/A 12/18/2015   Procedure: LAPAROSCOPIC SIGMOID COLECTOMY;  Surgeon: Alphonsa Overall, MD;  Location: [facility];  Service: General;  Laterality: N/A;  . TUBAL LIGATION  1970  . VAGINAL HYSTERECTOMY  1974   partial    Social History   Socioeconomic History  . Marital status: Married    Spouse name: Not on file  . Number of children: 1  . Years of education: Not on file  . Highest education level: Not on file  Occupational History  . Occupation: retired  Scientific laboratory technician  . Financial resource strain: Not on file  . Food insecurity:    Worry: Not on file    Inability: Not on file  . Transportation needs:    Medical: Not on file     Non-medical: Not on file  Tobacco Use  . Smoking status: Former Smoker    Packs/day: 1.00    Years: 20.00    Pack years: 20.00    Types: Cigarettes    Start date: 10/03/1966    Last attempt to quit: 11/03/1986    Years since quitting: 31.8  . Smokeless tobacco: Never Used  . Tobacco comment: quit 28 years ago  Substance and Sexual Activity  . Alcohol use: Yes    Alcohol/week: 0.0 standard drinks    Comment: Rare- Special Occasional   . Drug use: No  . Sexual activity: Not on file    Comment: lives with husband, no dietary restrictions  Lifestyle  . Physical activity:    Days per week: Not on file    Minutes per session: Not on file  . Stress: Not on file  Relationships  . Social connections:    Talks on phone: Not on file    Gets together: Not on file    Attends religious service: Not on file    Active member of club or organization: Not on file    Attends meetings of clubs or organizations: Not on file    Relationship status: Not on file  . Intimate partner violence:    Fear of current or ex partner: Not on file    Emotionally abused: Not on file    Physically abused: Not on file    Forced sexual activity: Not on file  Other Topics Concern  . Not on file  Social History Narrative  . Not on file    Family History  Problem Relation Age of Onset  . Diabetes Mother   . Heart attack Father   . Heart disease Father        39 and 62 MI  . Diabetes Sister   . Sickle cell anemia Sister   . Arthritis Sister   . Hypertension Sister   . Diabetes Sister   . Thyroid disease Sister   . Arthritis Sister   . Glaucoma Sister   . Esophageal cancer Neg Hx   . Stomach cancer Neg Hx   . Colon cancer Neg Hx      Immunization History  Administered Date(s) Administered  . Influenza Split 05/21/2011, 06/18/2012  . Influenza Whole 05/12/2013  . Influenza, High Dose Seasonal PF 04/23/2016, 05/15/2017, 03/31/2018  . Influenza,inj,Quad PF,6+ Mos 03/21/2014  .  Influenza-Unspecified 04/29/2015  . Pneumococcal Conjugate-13 03/21/2014  . Pneumococcal Polysaccharide-23 05/21/2011    Outpatient Encounter  Medications as of 09/04/2018  Medication Sig  . acetaminophen (TYLENOL) 500 MG tablet Take 1 tablet (500 mg total) by mouth every 6 (six) hours as needed.  Marland Kitchen aspirin EC 81 MG tablet Take 1 tablet (81 mg total) by mouth daily.  Marland Kitchen atorvastatin (LIPITOR) 10 MG tablet Take 1 tablet (10 mg total) by mouth daily.  . Biotin 5000 MCG CAPS Take 5,000 mcg by mouth.  . Continuous Blood Gluc Receiver (FREESTYLE LIBRE 14 DAY READER) DEVI 1 Device by Does not apply route 3 (three) times daily as needed.  Marland Kitchen escitalopram (LEXAPRO) 20 MG tablet TAKE 1 TABLET (20 MG TOTAL) BY MOUTH DAILY.  . ferrous sulfate (GNP IRON) 325 (65 FE) MG tablet Take 1 tablet (325 mg total) by mouth daily with breakfast.  . gabapentin (NEURONTIN) 300 MG capsule TAKE 2 CAPSULES BY MOUTH 3  TIMES DAILY  . glucose blood (ONETOUCH VERIO) test strip Use four times daily to check blood sugar.  DX  E11.9  . hydrochlorothiazide (HYDRODIURIL) 25 MG tablet TAKE 1 TABLET BY MOUTH  DAILY  . hydrOXYzine (ATARAX/VISTARIL) 25 MG tablet 1/2 to 1 tab po q8hrs PRN itching  . insulin lispro (HUMALOG KWIKPEN) 100 UNIT/ML KiwkPen INJECT FIVE UNITS  SUBCUTANEOUSLY WITH  BREAKFAST AND DINNER AND 7  UNITS AT LUNCH  . Insulin Pen Needle 32G X 4 MM MISC Use for insulin injection twice a day 250.00  . lisinopril (PRINIVIL,ZESTRIL) 5 MG tablet Take 1 tablet (5 mg total) by mouth 2 (two) times daily.  . meloxicam (MOBIC) 7.5 MG tablet Take 1 tablet (7.5 mg total) by mouth daily.  . metFORMIN (GLUCOPHAGE) 500 MG tablet TAKE 2 TABLETS BY MOUTH TWO TIMES DAILY  . Multiple Vitamin (MULTIVITAMIN) tablet Take 1 tablet by mouth daily.  [name] DELICA LANCETS 41P MISC Use as directed to check blood sugar 4 times per day dx code E11.9  . TOUJEO SOLOSTAR 300 UNIT/ML SOPN INJECT SUBCUTANEOUSLY 22  UNITS DAILY  .  triamterene-hydrochlorothiazide (MAXZIDE-25) 37.5-25 MG tablet Take 1 tablet by mouth daily.  . benzonatate (TESSALON) 100 MG capsule Take 1 capsule (100 mg total) by mouth 3 (three) times daily as needed for cough. (Patient not taking: Reported on 09/04/2018)  . lidocaine (LIDODERM) 5 % Place 1 patch onto the skin daily. Remove & Discard patch within 12 hours or as directed by MD (Patient not taking: Reported on 09/04/2018)   No facility-administered encounter medications on file as of 09/04/2018.      ROS: Pertinent positives and negatives noted in HPI. Remainder of ROS non-contributory    Allergies  Allergen Reactions  . Penicillins Swelling and Other (See Comments)    tongue swelled per pt Has patient had a PCN reaction causing immediate rash, facial/tongue/throat swelling, SOB or lightheadedness with hypotension: yes Has patient had a PCN reaction causing severe rash involving mucus membranes or skin necrosis: no Has patient had a PCN reaction that required hospitalization no  Has patient had a PCN reaction occurring within the last 10 years: unsure If all of the above answers are "NO", then may proceed with Cephalosporin use.     BP (!) 142/76   Pulse 85   Temp 98.1 F (36.7 C) (Oral)   Ht 5\' 1"  (1.549 m)   Wt 176 lb 3.2 oz (79.9 kg)   SpO2 96%   BMI 33.29 kg/m   BP Readings from Last 3 Encounters:  09/04/18 (!) 142/76  06/03/18 132/70  05/08/18 138/80     Physical  Exam  Constitutional: She is oriented to person, place, and time. She appears well-developed and well-nourished. No distress.  Eyes: Pupils are equal, round, and reactive to light. Conjunctivae are normal.  Neck: Neck supple. No JVD present.  Cardiovascular: Normal rate, regular rhythm, normal heart sounds and intact distal pulses.  Pulmonary/Chest: Effort normal and breath sounds normal. No respiratory distress.  Musculoskeletal: Normal range of motion.        General: No edema.  Lymphadenopathy:    She  has no cervical adenopathy.  Neurological: She is alert and oriented to person, place, and time. She has normal strength. She displays normal reflexes. No cranial nerve deficit or sensory deficit.  Skin: Skin is warm, dry and intact.  B/L foot exam done including monofilament testing - normal, no ulcers or open sores.     A/P:  1. 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 Essential hypertension - slightly elevated today but previously at goal - cont current meds - f/u in 2 mo  3. Mixed hyperlipidemia - cont statin  4. Depression with anxiety - pt with [facility] appt on 09/14/18 and she plans to attend and feels it will be helpful for her - she does not want to switch from her lexapro to another med. We will discuss this again at next OV bc I question how effective the med is for her based on current symptoms/mental state - f/u in 2 mo or sooner PRN

## 2018-09-14 ENCOUNTER — Ambulatory Visit: Payer: MEDICARE | Admitting: Psychology

## 2018-09-17 DIAGNOSIS — M4316 Spondylolisthesis, lumbar region: Secondary | ICD-10-CM | POA: Diagnosis not present

## 2018-09-17 DIAGNOSIS — M545 Low back pain: Secondary | ICD-10-CM | POA: Diagnosis not present

## 2018-10-15 ENCOUNTER — Ambulatory Visit: Payer: Self-pay | Admitting: *Deleted

## 2018-10-15 NOTE — Telephone Encounter (Signed)
Pt. Scheduled with Dr. Zigmund Daniel 3/19

## 2018-10-15 NOTE — Telephone Encounter (Signed)
Patient is having SOB- she gets out of breathe with walking. Patient states she has had this for 4 months. Patient states she is out of breath with activity.Due to the severity of her breathing - protocol directs patient to Ed- patient declines to go. Call to office- they agree if patient has labored breathing with activity- she needs to go- patient refuses to go to ED.   Reason for Disposition . [1] MODERATE difficulty breathing (e.g., speaks in phrases, SOB even at rest, pulse 100-120) AND [2] NEW-onset or WORSE than normal  Answer Assessment - Initial Assessment Questions 1. RESPIRATORY STATUS: "Describe your breathing?" (e.g., wheezing, shortness of breath, unable to speak, severe coughing)      Shortness of breath- moving around causes SOB 2. ONSET: "When did this breathing problem begin?"      4 months- getting worse now- patient has gotten Mucinex and she states things are getting loser in chest. 3. PATTERN "Does the difficult breathing come and go, or has it been constant since it started?"      constant 4. SEVERITY: "How bad is your breathing?" (e.g., mild, moderate, severe)    - MILD: No SOB at rest, mild SOB with walking, speaks normally in sentences, can lay down, no retractions, pulse < 100.    - MODERATE: SOB at rest, SOB with minimal exertion and prefers to sit, cannot lie down flat, speaks in phrases, mild retractions, audible wheezing, pulse 100-120.    - SEVERE: Very SOB at rest, speaks in single words, struggling to breathe, sitting hunched forward, retractions, pulse > 120      moderate 5. RECURRENT SYMPTOM: "Have you had difficulty breathing before?" If so, ask: "When was the last time?" and "What happened that time?"      Yes- given inhaler- 2-3 years ago 6. CARDIAC HISTORY: "Do you have any history of heart disease?" (e.g., heart attack, angina, bypass surgery, angioplasty)      no 7. LUNG HISTORY: "Do you have any history of lung disease?"  (e.g., pulmonary embolus,  asthma, emphysema)     no 8. CAUSE: "What do you think is causing the breathing problem?"      Allergy/brochitis  9. OTHER SYMPTOMS: "Do you have any other symptoms? (e.g., dizziness, runny nose, cough, chest pain, fever)     Runny nose, cough 10. PREGNANCY: "Is there any chance you are pregnant?" "When was your last menstrual period?"       n/a 11. TRAVEL: "Have you traveled out of the country in the last month?" (e.g., travel history, exposures)       no  Protocols used: BREATHING DIFFICULTY-A-AH

## 2018-10-15 NOTE — Telephone Encounter (Signed)
If symptoms have been present x 4 mo pt can likely been seen as an outpatient today or tomorrow. I'm out of the office so one of the other providers in the office will have to see her. Please call pt to schedule appt

## 2018-10-16 ENCOUNTER — Ambulatory Visit (INDEPENDENT_AMBULATORY_CARE_PROVIDER_SITE_OTHER): Payer: MEDICARE

## 2018-10-16 ENCOUNTER — Encounter: Payer: Self-pay | Admitting: Family Medicine

## 2018-10-16 ENCOUNTER — Ambulatory Visit (INDEPENDENT_AMBULATORY_CARE_PROVIDER_SITE_OTHER): Payer: MEDICARE | Admitting: Family Medicine

## 2018-10-16 ENCOUNTER — Other Ambulatory Visit: Payer: Self-pay

## 2018-10-16 VITALS — BP 172/82 | HR 98 | Temp 98.7°F | Resp 26 | Wt 174.2 lb

## 2018-10-16 DIAGNOSIS — R0602 Shortness of breath: Secondary | ICD-10-CM

## 2018-10-16 DIAGNOSIS — R05 Cough: Secondary | ICD-10-CM | POA: Diagnosis not present

## 2018-10-16 DIAGNOSIS — I10 Essential (primary) hypertension: Secondary | ICD-10-CM

## 2018-10-16 DIAGNOSIS — D508 Other iron deficiency anemias: Secondary | ICD-10-CM | POA: Diagnosis not present

## 2018-10-16 LAB — CBC
MCHC: 32.6 g/dL (ref 30.0–36.0)
MCV: 90.5 fl (ref 78.0–100.0)
Platelets: 554 10*3/uL — ABNORMAL HIGH (ref 150.0–400.0)
RBC: 2.78 Mil/uL — ABNORMAL LOW (ref 3.87–5.11)
RDW: 15.3 % (ref 11.5–15.5)
WBC: 11.4 10*3/uL — ABNORMAL HIGH (ref 4.0–10.5)

## 2018-10-16 LAB — BASIC METABOLIC PANEL
BUN: 15 mg/dL (ref 6–23)
CO2: 28 mEq/L (ref 19–32)
Calcium: 9.3 mg/dL (ref 8.4–10.5)
Chloride: 103 mEq/L (ref 96–112)
Creatinine, Ser: 1.47 mg/dL — ABNORMAL HIGH (ref 0.40–1.20)
GFR: 42.19 mL/min — ABNORMAL LOW (ref >60.00)
Glucose, Bld: 253 mg/dL — ABNORMAL HIGH (ref 70–99)
Potassium: 4.5 mEq/L (ref 3.5–5.1)
SODIUM: 139 meq/L (ref 135–145)

## 2018-10-16 MED ORDER — LEVOFLOXACIN 750 MG PO TABS: 750.0000 mg | ORAL_TABLET | Freq: Every day | ORAL | 0 refills | Status: AC

## 2018-10-16 MED ORDER — ALBUTEROL SULFATE HFA 108 (90 BASE) MCG/ACT IN AERS: 2.0000 | INHALATION_SPRAY | Freq: Four times a day (QID) | RESPIRATORY_TRACT | 0 refills | Status: DC | PRN

## 2018-10-16 NOTE — Patient Instructions (Addendum)
Try inhaler every 4-6 hours as needed for shortness of breath and wheezing We'll give you a call in regards to chest xray and lab results  Your blood pressure is elevated today.  Please keep upcoming appointment with Dr. Bryan Lemma.    How to Use a Metered Dose Inhaler A metered dose inhaler is a handheld device for taking medicine that must be breathed into the lungs (inhaled). The device can be used to deliver a variety of inhaled medicines, including:  Quick relief or rescue medicines, such as bronchodilators.  Controller medicines, such as corticosteroids. The medicine is delivered by pushing down on a metal canister to release a preset amount of spray and medicine. Each device contains the amount of medicine that is needed for a preset number of uses (inhalations). Your health care provider may recommend that you use a spacer with your inhaler to help you take the medicine more effectively. A spacer is a plastic tube with a mouthpiece on one end and an opening that connects to the inhaler on the other end. A spacer holds the medicine in a tube for a short time, which allows you to inhale more medicine. What are the risks? If you do not use your inhaler correctly, medicine might not reach your lungs to help you breathe. Inhaler medicine can cause side effects, such as:  Mouth or throat infection.  Cough.  Hoarseness.  Headache.  Nausea and vomiting.  Lung infection (pneumonia) in people who have a lung condition called COPD. How to use a metered dose inhaler without a spacer  1. Remove the cap from the inhaler. 2. If you are using the inhaler for the first time, shake it for 5 seconds, turn it away from your face, then release 4 puffs into the air. This is called priming. 3. Shake the inhaler for 5 seconds. 4. Position the inhaler so the top of the canister faces up. 5. Put your index finger on the top of the medicine canister. Support the bottom of the inhaler with your thumb.  6. Breathe out normally and as completely as possible, away from the inhaler. 7. Either place the inhaler between your teeth and close your lips tightly around the mouthpiece, or hold the inhaler 1-2 inches (2.5-5 cm) away from your open mouth. Keep your tongue down out of the way. If you are unsure which technique to use, ask your health care provider. 8. Press the canister down with your index finger to release the medicine, then inhale deeply and slowly through your mouth (not your nose) until your lungs are completely filled. Inhaling should take 4-6 seconds. 9. Hold the medicine in your lungs for 5-10 seconds (10 seconds is best). This helps the medicine get into the small airways of your lungs. 10. With your lips in a tight circle (pursed), breathe out slowly. 11. Repeat steps 3-10 until you have taken the number of puffs that your health care provider directed. Wait about 1 minute between puffs or as directed. 12. Put the cap on the inhaler. 13. If you are using a steroid inhaler, rinse your mouth with water, gargle, and spit out the water. Do not swallow the water. How to use a metered dose inhaler with a spacer  1. Remove the cap from the inhaler. 2. If you are using the inhaler for the first time, shake it for 5 seconds, turn it away from your face, then release 4 puffs into the air. This is called priming. 3. Shake the inhaler for  5 seconds. 4. Place the open end of the spacer onto the inhaler mouthpiece. 5. Position the inhaler so the top of the canister faces up and the spacer mouthpiece faces you. 6. Put your index finger on the top of the medicine canister. Support the bottom of the inhaler and the spacer with your thumb. 7. Breathe out normally and as completely as possible, away from the spacer. 8. Place the spacer between your teeth and close your lips tightly around it. Keep your tongue down out of the way. 9. Press the canister down with your index finger to release the  medicine, then inhale deeply and slowly through your mouth (not your nose) until your lungs are completely filled. Inhaling should take 4-6 seconds. 10. Hold the medicine in your lungs for 5-10 seconds (10 seconds is best). This helps the medicine get into the small airways of your lungs. 11. With your lips in a tight circle (pursed), breathe out slowly. 12. Repeat steps 3-11 until you have taken the number of puffs that your health care provider directed. Wait about 1 minute between puffs or as directed. 13. Remove the spacer from the inhaler and put the cap on the inhaler. 14. If you are using a steroid inhaler, rinse your mouth with water, gargle, and spit out the water. Do not swallow the water. Follow these instructions at home:  Take your inhaled medicine only as told by your health care provider. Do not use the inhaler more than directed by your health care provider.  Keep all follow-up visits as told by your health care provider. This is important.  If your inhaler has a counter, you can check it to determine how full your inhaler is. If your inhaler does not have a counter, ask your health care provider when you will need to refill your inhaler and write the refill date on a calendar or on your inhaler canister. Note that you cannot know when an inhaler is empty by shaking it.  Follow directions on the package insert for care and cleaning of your inhaler and spacer. Contact a health care provider if:  Symptoms are only partially relieved with your inhaler.  You are having trouble using your inhaler.  You have an increase in phlegm.  You have headaches. Get help right away if:  You feel little or no relief after using your inhaler.  You have dizziness.  You have a fast heart rate.  You have chills or a fever.  You have night sweats.  There is blood in your phlegm. Summary  A metered dose inhaler is a handheld device for taking medicine that must be breathed into the  lungs (inhaled).  The medicine is delivered by pushing down on a metal canister to release a preset amount of spray and medicine.  Each device contains the amount of medicine that is needed for a preset number of uses (inhalations). This information is not intended to replace advice given to you by your health care provider. Make sure you discuss any questions you have with your health care provider. Document Released: 07/15/2005 Document Revised: 02/03/2017 Document Reviewed: 06/04/2016 Elsevier Interactive Patient Education  2019 [employer].

## 2018-10-16 NOTE — Assessment & Plan Note (Addendum)
-  EKG completed and normal without signs of ishcemia.  -CXR ordered -Albuterol inhaler -Check CBC, has history of anemia  Addendum:  CXR with RUL pneumonia, start levaquin given PCN allergy with tongue swelling.  Has f/u with pcp in a couple of weeks.

## 2018-10-16 NOTE — Progress Notes (Signed)
Beth Maldonado - 73 y.o. female MRN [id number]  Date of birth: 07-04-1946  Subjective Chief Complaint  Patient presents with  . Cough    3 MS F/U S.O.B , no improvement    HPI Beth Maldonado is a 73 y.o. female here today with complaint of shortness of breath.  Initial onset of symptoms was about 3 months ago and has been getting progressively worse since that time.  shortness of breath is worse with exertional activities.  She denies any associated chest pain or tightness.  She has a remote history of smoking, quit in her 30's.  She has no prior history of asthma but has needed inhalers in the past for bronchitis.  She has had to raise the head of her bed some to sleep.  She denies swelling of her legs. She denies fever, chills, nausea, vomiting, diarrhea, body aches, or recent travel outside of [place].   ROS:  A comprehensive ROS was completed and negative except as noted per HPI  Allergies  Allergen Reactions  . Penicillins Swelling and Other (See Comments)    tongue swelled per pt Has patient had a PCN reaction causing immediate rash, facial/tongue/throat swelling, SOB or lightheadedness with hypotension: yes Has patient had a PCN reaction causing severe rash involving mucus membranes or skin necrosis: no Has patient had a PCN reaction that required hospitalization no  Has patient had a PCN reaction occurring within the last 10 years: unsure If all of the above answers are "NO", then may proceed with Cephalosporin use.     Past Medical History:  Diagnosis Date  . Acute bronchitis 03/29/2013  . Acute upper respiratory infection 11/27/2014  . Allergy   . Anemia 07/02/2015  . Arthritis   . Back pain    upper and lower last 20 years  . Cancer of left colon ([place]) 12/18/2015  . Depression with anxiety 06/27/2013  . Diabetes mellitus   . Hx of adenomatous colonic polyps 10/27/2015  . Hyperlipidemia   . Hypertension   . Insomnia 06/30/2015  . Medicare annual wellness visit,  subsequent 03/23/2014  . Neck pain on left side 05/29/2014   hx of  . Neuromuscular disorder (HCC)    neuropathy feet  . Neuropathy   . Osteopenia   . Preventative health care 07/02/2015  . Tinnitus    RIGHT EAR ALL THE TIME    Past Surgical History:  Procedure Laterality Date  . BREAST ENHANCEMENT SURGERY Bilateral 1987   Reduction  . CATARACT EXTRACTION, BILATERAL  2011  . COLONOSCOPY  2017   colon cancer  . [place]  . KNEE ARTHROSCOPY Right 2003  . LAPAROSCOPIC SIGMOID COLECTOMY N/A 12/18/2015   Procedure: LAPAROSCOPIC SIGMOID COLECTOMY;  Surgeon: Alphonsa Overall, MD;  Location: [facility];  Service: General;  Laterality: N/A;  . TUBAL LIGATION  1970  . VAGINAL HYSTERECTOMY  1974   partial    Social History   Socioeconomic History  . Marital status: Married    Spouse name: Not on file  . Number of children: 1  . Years of education: Not on file  . Highest education level: Not on file  Occupational History  . Occupation: retired  Scientific laboratory technician  . Financial resource strain: Not on file  . Food insecurity:    Worry: Not on file    Inability: Not on file  . Transportation needs:    Medical: Not on file    Non-medical: Not on file  Tobacco Use  .  Smoking status: Former Smoker    Packs/day: 1.00    Years: 20.00    Pack years: 20.00    Types: Cigarettes    Start date: 10/03/1966    Last attempt to quit: 11/03/1986    Years since quitting: 31.9  . Smokeless tobacco: Never Used  . Tobacco comment: quit 28 years ago  Substance and Sexual Activity  . Alcohol use: Yes    Alcohol/week: 0.0 standard drinks    Comment: Rare- Special Occasional   . Drug use: No  . Sexual activity: Not on file    Comment: lives with husband, no dietary restrictions  Lifestyle  . Physical activity:    Days per week: Not on file    Minutes per session: Not on file  . Stress: Not on file  Relationships  . Social connections:    Talks on phone: Not on file    Gets together: Not  on file    Attends religious service: Not on file    Active member of club or organization: Not on file    Attends meetings of clubs or organizations: Not on file    Relationship status: Not on file  Other Topics Concern  . Not on file  Social History Narrative  . Not on file    Family History  Problem Relation Age of Onset  . Diabetes Mother   . Heart attack Father   . Heart disease Father        28 and 22 MI  . Diabetes Sister   . Sickle cell anemia Sister   . Arthritis Sister   . Hypertension Sister   . Diabetes Sister   . Thyroid disease Sister   . Arthritis Sister   . Glaucoma Sister   . Esophageal cancer Neg Hx   . Stomach cancer Neg Hx   . Colon cancer Neg Hx     Health Maintenance  Topic Date Due  . HEMOGLOBIN A1C  03/05/2019  . OPHTHALMOLOGY EXAM  05/26/2019  . MAMMOGRAM  05/28/2019  . TETANUS/TDAP  08/08/2019  . FOOT EXAM  09/05/2019  . COLONOSCOPY  01/28/2021  . INFLUENZA VACCINE  Completed  . DEXA SCAN  Completed  . Hepatitis C Screening  Completed  . PNA vac Low Risk Adult  Completed    ----------------------------------------------------------------------------------------------------------------------------------------------------------------------------------------------------------------- Physical Exam BP (!) 172/82   Pulse 98   Temp 98.7 F (37.1 C) (Oral)   Resp (!) 26   Wt 174 lb 3.2 oz (79 kg)   SpO2 96%   BMI 32.91 kg/m   Physical Exam HENT:     Head: Normocephalic and atraumatic.     Right Ear: Tympanic membrane normal.     Left Ear: Tympanic membrane normal.     Nose: No congestion.     Mouth/Throat:     Mouth: Mucous membranes are moist.  Eyes:     General: No scleral icterus. Neck:     Musculoskeletal: Neck supple.  Cardiovascular:     Rate and Rhythm: Normal rate and regular rhythm.     Heart sounds: Normal heart sounds.  Pulmonary:     Effort: Pulmonary effort is normal.     Breath sounds: Wheezing (bilaterally. )  and rales (On R) present.  Lymphadenopathy:     Cervical: No cervical adenopathy.  Skin:    General: Skin is warm and dry.     Findings: No rash.  Neurological:     General: No focal deficit present.     Mental Status:  She is alert.  Psychiatric:        Mood and Affect: Mood normal.        Behavior: Behavior normal.     ------------------------------------------------------------------------------------------------------------------------------------------------------------------------------------------------------------------- Assessment and Plan  Dyspnea -EKG completed and normal without signs of ishcemia.  -CXR ordered -Albuterol inhaler -Check CBC, has history of anemia  Addendum:  CXR with RUL pneumonia, start levaquin given PCN allergy with tongue swelling.  Has f/u with pcp in a couple of weeks.

## 2018-10-16 NOTE — Progress Notes (Signed)
Please let her know that her chest xray shows that she has pneumonia.  I have sent over an antibiotic for her to start.

## 2018-10-20 ENCOUNTER — Other Ambulatory Visit: Payer: Self-pay | Admitting: Family Medicine

## 2018-10-20 ENCOUNTER — Telehealth: Payer: Self-pay

## 2018-10-20 DIAGNOSIS — D649 Anemia, unspecified: Secondary | ICD-10-CM

## 2018-10-20 NOTE — Telephone Encounter (Signed)
Copied from [place] [id number]. Topic: General - Inquiry >> Oct 20, 2018  4:06 PM Reyne Dumas L wrote: Reason for CRM:   Pt called [facility] and states that she received a call from a nurse this morning but was half asleep and she doesn't understand the message and needs a call back at [phone]

## 2018-10-20 NOTE — Progress Notes (Signed)
Glucose is elevated and her anemia has worsened.  I would like to recheck this again later this week to be sure it  is not continuing to drop.  Orders entered, please screen for COVID and add to lab schedule for Thurs/Friday of this week.

## 2018-10-21 ENCOUNTER — Other Ambulatory Visit: Payer: Self-pay | Admitting: Family Medicine

## 2018-10-21 DIAGNOSIS — E083299 Diabetes mellitus due to underlying condition with mild nonproliferative diabetic retinopathy without macular edema, unspecified eye: Secondary | ICD-10-CM

## 2018-10-21 DIAGNOSIS — Z794 Long term (current) use of insulin: Principal | ICD-10-CM

## 2018-10-21 NOTE — Progress Notes (Signed)
Pt called back. She is on schedule for Friday.

## 2018-10-23 ENCOUNTER — Other Ambulatory Visit: Payer: MEDICARE

## 2018-10-23 ENCOUNTER — Other Ambulatory Visit: Payer: Self-pay

## 2018-10-23 DIAGNOSIS — Z794 Long term (current) use of insulin: Secondary | ICD-10-CM | POA: Diagnosis not present

## 2018-10-23 DIAGNOSIS — E083299 Diabetes mellitus due to underlying condition with mild nonproliferative diabetic retinopathy without macular edema, unspecified eye: Secondary | ICD-10-CM | POA: Diagnosis not present

## 2018-10-23 DIAGNOSIS — D649 Anemia, unspecified: Secondary | ICD-10-CM

## 2018-10-23 NOTE — Addendum Note (Signed)
Addended by: Lynnea Ferrier on: 10/23/2018 10:42 AM   Modules accepted: Orders

## 2018-10-26 LAB — IRON,TIBC AND FERRITIN PANEL
%SAT: 27 % (calc) (ref 16–45)
Ferritin: 91 ng/mL (ref 16–288)
IRON: 72 ug/dL (ref 45–160)
TIBC: 265 ug/dL (ref 250–450)

## 2018-10-26 LAB — CBC WITH DIFFERENTIAL/PLATELET
ABSOLUTE MONOCYTES: 817 {cells}/uL (ref 200–950)
Basophils Absolute: 73 cells/uL (ref 0–200)
Basophils Relative: 0.6 %
Eosinophils Absolute: 842 cells/uL — ABNORMAL HIGH (ref 15–500)
Eosinophils Relative: 6.9 %
HEMATOCRIT: 26.9 % — AB (ref 35.0–45.0)
Hemoglobin: 8.8 g/dL — ABNORMAL LOW (ref 11.7–15.5)
Lymphs Abs: 2928 cells/uL (ref 850–3900)
MCH: 28.9 pg (ref 27.0–33.0)
MCHC: 32.7 g/dL (ref 32.0–36.0)
MCV: 88.5 fL (ref 80.0–100.0)
MPV: 9.2 fL (ref 7.5–12.5)
Monocytes Relative: 6.7 %
Neutro Abs: 7540 cells/uL (ref 1500–7800)
Neutrophils Relative %: 61.8 %
Platelets: 819 10*3/uL — ABNORMAL HIGH (ref 140–400)
RBC: 3.04 10*6/uL — ABNORMAL LOW (ref 3.80–5.10)
RDW: 13.6 % (ref 11.0–15.0)
Total Lymphocyte: 24 %
WBC: 12.2 10*3/uL — ABNORMAL HIGH (ref 3.8–10.8)

## 2018-10-26 LAB — HEMOGLOBIN A1C
Hgb A1c MFr Bld: 11.9 % of total Hgb — ABNORMAL HIGH (ref <5.7)
Mean Plasma Glucose: 295 (calc)
eAG (mmol/L): 16.3 (calc)

## 2018-10-26 LAB — MICROALBUMIN / CREATININE URINE RATIO

## 2018-10-29 ENCOUNTER — Telehealth: Payer: Self-pay

## 2018-10-29 NOTE — Telephone Encounter (Signed)
Agree that pt needs to keep appt scheduled with me on 4/7 and f/u sooner if needed. Thanks [place] for seeing her!

## 2018-10-29 NOTE — Telephone Encounter (Signed)
Keep upcoming appt with Dr. Bryan Lemma, if symptoms begin to worsen again please have her contact us sooner.

## 2018-10-29 NOTE — Telephone Encounter (Signed)
Called Pt to inform her . She is aware. Call completed.

## 2018-10-29 NOTE — Telephone Encounter (Signed)
-----   Message from [place], DO sent at 10/29/2018  8:25 AM EDT ----- -Anemia is stable -White count up a little compared to previous check. -How is she feeling?  Did she complete antibiotic?

## 2018-10-29 NOTE — Progress Notes (Signed)
-  Anemia is stable -White count up a little compared to previous check. -How is she feeling?  Did she complete antibiotic?

## 2018-10-30 NOTE — Progress Notes (Signed)
Called Pt. See other encounter. Task completed. Pt is aware.

## 2018-11-03 ENCOUNTER — Ambulatory Visit (INDEPENDENT_AMBULATORY_CARE_PROVIDER_SITE_OTHER): Payer: MEDICARE | Admitting: Family Medicine

## 2018-11-03 ENCOUNTER — Encounter: Payer: Self-pay | Admitting: Family Medicine

## 2018-11-03 DIAGNOSIS — E1165 Type 2 diabetes mellitus with hyperglycemia: Secondary | ICD-10-CM

## 2018-11-03 DIAGNOSIS — D75839 Thrombocytosis, unspecified: Secondary | ICD-10-CM

## 2018-11-03 DIAGNOSIS — F4321 Adjustment disorder with depressed mood: Secondary | ICD-10-CM | POA: Diagnosis not present

## 2018-11-03 DIAGNOSIS — R0609 Other forms of dyspnea: Secondary | ICD-10-CM

## 2018-11-03 DIAGNOSIS — F439 Reaction to severe stress, unspecified: Secondary | ICD-10-CM | POA: Diagnosis not present

## 2018-11-03 DIAGNOSIS — D473 Essential (hemorrhagic) thrombocythemia: Secondary | ICD-10-CM | POA: Diagnosis not present

## 2018-11-03 DIAGNOSIS — Z85038 Personal history of other malignant neoplasm of large intestine: Secondary | ICD-10-CM | POA: Diagnosis not present

## 2018-11-03 DIAGNOSIS — Z8701 Personal history of pneumonia (recurrent): Secondary | ICD-10-CM | POA: Diagnosis not present

## 2018-11-03 DIAGNOSIS — D649 Anemia, unspecified: Secondary | ICD-10-CM

## 2018-11-03 DIAGNOSIS — K921 Melena: Secondary | ICD-10-CM | POA: Diagnosis not present

## 2018-11-03 DIAGNOSIS — R0600 Dyspnea, unspecified: Secondary | ICD-10-CM

## 2018-11-03 NOTE — Progress Notes (Signed)
Virtual Visit via Video Note  I connected with Beth Maldonado on 11/03/18 at  2:00 PM EDT by a video enabled telemedicine application and verified that I am speaking with the correct person using two identifiers.  Interactive audio and video telecommunications were attempted between myself and the patient, however failed, due to the patient having technical difficulties OR the patient did not have access to video capability. We continued and completed the visit with audio only.  Location patient: home Location provider: home office Persons participating in the virtual visit: patient, provider  I discussed the limitations of evaluation and management by telemedicine and the availability of in person appointments. The patient expressed understanding and agreed to proceed.  Chief Complaint  Patient presents with  . Follow-up    f/u DM/ running 120 sometimes less   HPI: Beth Maldonado is a 73 y.o. female here for f/u on her uncontrolled DM. She was last seen by me on 09/04/18 and at that time her A1C was 10.0 (up from 8.4 in 04/2018). After that OV, pt was to increase her metformin to 1000mg  BID and her toujeo to 24 units from 22 units. She was to continue humalog 7 units BID. She also had not been checking her BS. She was previously prescribed a continuous glucose monitor but she didn't like it/didn't wear it. She has a glucometer but was not using it either. I asked her to check BS BID and keep log.  Today, pt her fasting BS have been around 120 with the highest being in 130's. She does not check post-prandial BS. She notes a few "low episodes" where she feels lightheaded and shaky and these tend to occur around lunch time. She does not check her BS at this time, she "can just feel it". She states she has worked to improve her diet and is eating "mainly fruits and vegetables". She is eating beans, fish. She does not eat pork, beef. She has been limiting her carbs.  Pt is taking meds as directed and  listed above in HPI. Pt does not engage in any regular physical activity or exercise.  Her most recent A1C, listed below, increased to 11.9 from 10.0.  Lab Results  Component Value Date   HGBA1C 11.9 (H) 10/23/2018   Since last OV with me, pt was seen by my colleague Dr. Zigmund Daniel on 10/16/18 for shortness of breath. EKG was normal, CXR showed RUL pneumonia and pt was treated with levaquin 750mg  daily x 5 days and albuterol inhaler. CBC done d/t h/o anemia and Hgb/Hct was lower (8.2/25.2) compared to her baseline (11.2-11.8/33-35). MCV 90.5 (normocytic anemia. CBC repeated and iron panel added. Iron panel WNL and improved compared to previous in 10/2016. CBC also shows thrombocytosis which is not new but increased.  Pt states she is 100% better - no cough, no mucous, voice is normal, energy is back to normal.  On questioning, she notes having some "light blood" in her stool once in a while. She estimates she notes this maybe once per week or slightly less. This is on the toilet paper. She states she has to strain to have a BM (not new) and has a h/o hemorrhoids requiring surgery in the past. She has a h/o colon cancer about 3 years ago. Last colonoscopy 01/2018. She follows with [facility] Dr. Carlean Purl. Pt also endorses "some" SOB with minimal exertion and she gives the example of walking from the waiting room to the exam room at another provider's office. She denies  LE edema, orthopnea, cough. Her weight has been stable, as per pt.   Component     Latest Ref Rng & Units 10/16/2018 10/23/2018           WBC     3.8 - 10.8 Thousand/uL 11.4 (H) 12.2 (H)  RBC     3.80 - 5.10 Million/uL 2.78 (L) 3.04 (L)  Hemoglobin     11.7 - 15.5 g/dL 8.2 Repeated and verified X2. (L) 8.8 (L)  HCT     35.0 - 45.0 % 25.2 Repeated and verified X2. (L) 26.9 (L)  MCV     80.0 - 100.0 fL 90.5 88.5  MCH     27.0 - 33.0 pg  28.9  MCHC     32.0 - 36.0 g/dL 32.6 32.7  RDW     11.0 - 15.0 % 15.3 13.6  Platelets     140 - 400  Thousand/uL 554.0 (H) 819 (H)  MPV     7.5 - 12.5 fL  9.2  NEUT#     1,500 - 7,800 cells/uL  7,540  Lymphocyte #     850 - 3,900 cells/uL  2,928  Absolute Monocytes     200 - 950 cells/uL  817  Eosinophils Absolute     15 - 500 cells/uL  842 (H)  Basophils Absolute     0 - 200 cells/uL  73  Neutrophils     %  61.8  Total Lymphocyte     %  24.0  Monocytes Relative     %  6.7  Eosinophil     %  6.9  Basophil     %  0.6  Smear Review         Lymphocytes     %    Monocyte #     0.1 - 1.0 K/uL    lymph#     0.9 - 3.3 10e3/uL    MONO#     0.1 - 0.9 10e3/uL    BASO#     0.0 - 0.2 10e3/uL    NEUT%     39.6 - 80.0 %    LYMPH%     14.0 - 48.0 %    MONO%     0.0 - 13.0 %    EOS%     0.0 - 7.0 %    BASO%     0.0 - 2.0 %      Component     Latest Ref Rng & Units 10/23/2018  Iron     45 - 160 mcg/dL 72  TIBC     250 - 450 mcg/dL (calc) 265  %SAT     16 - 45 % (calc) 27  Ferritin     16 - 288 ng/mL 91    She also requests referral to [facility] counseling at our office. She was scheduled to be seen elsewhere but she does not want to do that.   Past Medical History:  Diagnosis Date  . Acute bronchitis 03/29/2013  . Acute upper respiratory infection 11/27/2014  . Allergy   . Anemia 07/02/2015  . Arthritis   . Back pain    upper and lower last 20 years  . Cancer of left colon ([place]) 12/18/2015  . Depression with anxiety 06/27/2013  . Diabetes mellitus   . Hx of adenomatous colonic polyps 10/27/2015  . Hyperlipidemia   . Hypertension   . Insomnia 06/30/2015  . Medicare annual wellness visit, subsequent 03/23/2014  . Neck pain on left  side 05/29/2014   hx of  . Neuromuscular disorder (HCC)    neuropathy feet  . Neuropathy   . Osteopenia   . Preventative health care 07/02/2015  . Tinnitus    RIGHT EAR ALL THE TIME    Past Surgical History:  Procedure Laterality Date  . BREAST ENHANCEMENT SURGERY Bilateral 1987   Reduction  . CATARACT EXTRACTION, BILATERAL  2011  .  COLONOSCOPY  2017   colon cancer  . [place]  . KNEE ARTHROSCOPY Right 2003  . LAPAROSCOPIC SIGMOID COLECTOMY N/A 12/18/2015   Procedure: LAPAROSCOPIC SIGMOID COLECTOMY;  Surgeon: Alphonsa Overall, MD;  Location: [facility];  Service: General;  Laterality: N/A;  . TUBAL LIGATION  1970  . VAGINAL HYSTERECTOMY  1974   partial    Family History  Problem Relation Age of Onset  . Diabetes Mother   . Heart attack Father   . Heart disease Father        72 and 3 MI  . Diabetes Sister   . Sickle cell anemia Sister   . Arthritis Sister   . Hypertension Sister   . Diabetes Sister   . Thyroid disease Sister   . Arthritis Sister   . Glaucoma Sister   . Esophageal cancer Neg Hx   . Stomach cancer Neg Hx   . Colon cancer Neg Hx     Social History   Tobacco Use  . Smoking status: Former Smoker    Packs/day: 1.00    Years: 20.00    Pack years: 20.00    Types: Cigarettes    Start date: 10/03/1966    Last attempt to quit: 11/03/1986    Years since quitting: 32.0  . Smokeless tobacco: Never Used  . Tobacco comment: quit 28 years ago  Substance Use Topics  . Alcohol use: Yes    Alcohol/week: 0.0 standard drinks    Comment: Rare- Special Occasional   . Drug use: No     Current Outpatient Medications:  .  albuterol (PROVENTIL HFA;VENTOLIN HFA) 108 (90 Base) MCG/ACT inhaler, Inhale 2 puffs into the lungs every 6 (six) hours as needed for wheezing or shortness of breath., Disp: 1 Inhaler, Rfl: 0 .  aspirin EC 81 MG tablet, Take 1 tablet (81 mg total) by mouth daily., Disp: 90 tablet, Rfl: 0 .  atorvastatin (LIPITOR) 10 MG tablet, Take 1 tablet (10 mg total) by mouth daily., Disp: 90 tablet, Rfl: 1 .  Biotin 5000 MCG CAPS, Take 5,000 mcg by mouth., Disp: , Rfl:  .  Continuous Blood Gluc Receiver (FREESTYLE LIBRE 14 DAY READER) DEVI, 1 Device by Does not apply route 3 (three) times daily as needed., Disp: 1 Device, Rfl: 0 .  escitalopram (LEXAPRO) 20 MG tablet, TAKE 1 TABLET (20 MG  TOTAL) BY MOUTH DAILY., Disp: 30 tablet, Rfl: 0 .  ferrous sulfate (GNP IRON) 325 (65 FE) MG tablet, Take 1 tablet (325 mg total) by mouth daily with breakfast., Disp: 90 tablet, Rfl: 3 .  gabapentin (NEURONTIN) 300 MG capsule, TAKE 2 CAPSULES BY MOUTH 3  TIMES DAILY, Disp: 180 capsule, Rfl: 1 .  glucose blood (ONETOUCH VERIO) test strip, Use four times daily to check blood sugar.  DX  E11.9, Disp: 200 each, Rfl: 3 .  hydrochlorothiazide (HYDRODIURIL) 25 MG tablet, TAKE 1 TABLET BY MOUTH  DAILY, Disp: 30 tablet, Rfl: 0 .  hydrOXYzine (ATARAX/VISTARIL) 25 MG tablet, 1/2 to 1 tab po q8hrs PRN itching, Disp: 30 tablet, Rfl: 2 .  insulin lispro (HUMALOG KWIKPEN) 100 UNIT/ML KiwkPen, INJECT FIVE UNITS  SUBCUTANEOUSLY WITH  BREAKFAST AND DINNER AND 7  UNITS AT LUNCH, Disp: 15 mL, Rfl: 2 .  Insulin Pen Needle 32G X 4 MM MISC, Use for insulin injection twice a day 250.00, Disp: 100 each, Rfl: 2 .  lidocaine (LIDODERM) 5 %, Place 1 patch onto the skin daily. Remove & Discard patch within 12 hours or as directed by MD, Disp: 30 patch, Rfl: 0 .  lisinopril (PRINIVIL,ZESTRIL) 5 MG tablet, Take 1 tablet (5 mg total) by mouth 2 (two) times daily., Disp: 180 tablet, Rfl: 1 .  metFORMIN (GLUCOPHAGE) 500 MG tablet, TAKE 2 TABLETS BY MOUTH TWO TIMES DAILY, Disp: 120 tablet, Rfl: 0 .  Multiple Vitamin (MULTIVITAMIN) tablet, Take 1 tablet by mouth daily., Disp: , Rfl:  .  ONETOUCH DELICA LANCETS 38U MISC, Use as directed to check blood sugar 4 times per day dx code E11.9, Disp: 200 each, Rfl: 3 .  TOUJEO SOLOSTAR 300 UNIT/ML SOPN, INJECT SUBCUTANEOUSLY 22  UNITS DAILY, Disp: 9 mL, Rfl: 1 .  triamterene-hydrochlorothiazide (MAXZIDE-25) 37.5-25 MG tablet, Take 1 tablet by mouth daily., Disp: 90 tablet, Rfl: 1 .  acetaminophen (TYLENOL) 500 MG tablet, Take 1 tablet (500 mg total) by mouth every 6 (six) hours as needed. (Patient not taking: Reported on 11/03/2018), Disp: 30 tablet, Rfl: 0 .  benzonatate (TESSALON) 100 MG  capsule, Take 1 capsule (100 mg total) by mouth 3 (three) times daily as needed for cough. (Patient not taking: Reported on 11/03/2018), Disp: 40 capsule, Rfl: 0 .  meloxicam (MOBIC) 7.5 MG tablet, Take 1 tablet (7.5 mg total) by mouth daily. (Patient not taking: Reported on 11/03/2018), Disp: 90 tablet, Rfl: 1  Allergies  Allergen Reactions  . Penicillins Swelling and Other (See Comments)    tongue swelled per pt Has patient had a PCN reaction causing immediate rash, facial/tongue/throat swelling, SOB or lightheadedness with hypotension: yes Has patient had a PCN reaction causing severe rash involving mucus membranes or skin necrosis: no Has patient had a PCN reaction that required hospitalization no  Has patient had a PCN reaction occurring within the last 10 years: unsure If all of the above answers are "NO", then may proceed with Cephalosporin use.       ROS: See pertinent positives and negatives per HPI.   EXAM:  VITALS per patient if applicable:  GENERAL: alert, oriented  LUNGS: no conversational dyspnea, no audible cough  PSYCH/NEURO: pleasant and cooperative, speech and thought processing grossly intact   ASSESSMENT AND PLAN: 1. Stress 2. Grief reaction - couple of deaths in the family over the past 1-2 years and financial/legal stress between family members - cont lexapro 20mg  daily at this time - pt is not agreeable to switching med at this time - Ambulatory referral to Psychology  3. Uncontrolled type 2 diabetes mellitus with hyperglycemia ([place]) - pt not compliant in checking BS BID - A1C has continued to increase from 04/2018 to present (8.4 -->, 10.0 --> 11.9) despite patient reporting recent FBS in 120s and significant improvement in diet - Ambulatory referral to Endocrinology - cont lisinopril, lipitor, ASA 81mg  daily - UTD on eye exam, due for foot exam  4. Dyspnea on minimal exertion - present prior to dx of pneumonia in 09/2018 and still present despite  treatment and resolution of pneumonia - no h/o heart failure and CXR done 09/2018 did not show cardiomegaly; pt has not had an echo - she has no  pulmonary diagnoses but does have a h/o 20 pk year h/o smoking. Will obtain PFTs in the future/coming months once current COVID-19 pandemic subsides.  - symptom could be d/t worsening anemia (see below #5 and #6) and based on h/o CRC 3 years ago, will r/o anemia as cause and then pursue further work-up if needed  5. Normocytic anemia 6. History of colon cancer in adulthood 7. Hematochezia - worsening anemia compared to 11/2017, MCV has remained normocytic, iron panel WNL - she is taking 325mg  iron daily, was previously taking BID until 05/2018 - pt endorses "light blood" on toilet paper after BM about once per 1-2 wks x "months or more" and in light of personal h/o CRC, I believe pt needs to f/u with GI despite last colonoscopy in 01/2018. Will cc GI Dr. Carlean Purl on this note - if anemia is not d/t GI etiology, pt will need additional heme work-up for this - will check B12 and folate (see orders)  8. Personal history of pneumonia - resolved after 5 days course of levaquin 750mg  daily - pt states cough, fatigue, hoarse voice have all resolved  9. Thrombocytosis - chronic diagnosis, platelets likely increased due to recent pneumonia, will cont to monitor and plan to recheck in 1 mo  I discussed the assessment and treatment plan with the patient. The patient was provided an opportunity to ask questions and all were answered. The patient agreed with the plan and demonstrated an understanding of the instructions.   The patient was advised to call back or seek an in-person evaluation if the symptoms worsen or if the condition fails to improve as anticipated.   Letta Median, DO

## 2018-11-05 NOTE — Progress Notes (Signed)
Pt scheduled lab appointment 4/15

## 2018-11-10 ENCOUNTER — Telehealth: Payer: Self-pay | Admitting: Behavioral Health

## 2018-11-10 NOTE — Telephone Encounter (Signed)
Questions for Screening COVID-19  Symptom onset: None   Travel or Contacts: No travel  During this illness, did/does the patient experience any of the following symptoms? Fever >100.46F []   Yes [x]   No []   Unknown Subjective fever (felt feverish) []   Yes [x]   No []   Unknown Chills []   Yes [x]   No []   Unknown Muscle aches (myalgia) []   Yes [x]   No []   Unknown Runny nose (rhinorrhea) []   Yes [x]   No []   Unknown Sore throat []   Yes [x]   No []   Unknown Cough (new onset or worsening of chronic cough) []   Yes [x]   No []   Unknown Shortness of breath (dyspnea) []   Yes [x]   No []   Unknown Nausea or vomiting []   Yes [x]   No []   Unknown Headache []   Yes [x]   No []   Unknown Abdominal pain  []   Yes [x]   No []   Unknown Diarrhea (?3 loose/looser than normal stools/24hr period) []   Yes [x]   No []   Unknown Other, specify:  Patient risk factors: Smoker? []   Current []   Former []   Never If female, currently pregnant? []   Yes [x]   No  Patient Active Problem List   Diagnosis Date Noted  . Chronic low back pain 12/08/2016  . Cancer of left colon ([place]) 12/18/2015  . Hx of adenomatous colonic polyps and colon cancer 10/27/2015  . Anemia 07/02/2015  . Preventative health care 07/02/2015  . Insomnia 06/30/2015  . Obesity 11/27/2014  . Neck pain on left side 05/29/2014  . Dyspnea 04/27/2014  . Pain in joint, shoulder region 03/23/2014  . Medicare annual wellness visit, subsequent 03/23/2014  . Hematuria 11/21/2013  . Thrombocytosis ([place]) 10/10/2013  . Unspecified hereditary and idiopathic peripheral neuropathy 10/10/2013  . Depression with anxiety 06/27/2013  . Osteopenia   . Falls 11/17/2011  . Diabetes mellitus ([place]) 02/20/2011  . Hypertension 02/20/2011  . Hyperlipidemia 02/20/2011  . 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 Call ahead.  []   High risk for COVID-19 but stable. Inform  provider and coordinate time for [facility] visit.   []   No red flags but URI signs or symptoms okay for [facility] visit.

## 2018-11-11 ENCOUNTER — Other Ambulatory Visit (INDEPENDENT_AMBULATORY_CARE_PROVIDER_SITE_OTHER): Payer: MEDICARE

## 2018-11-11 DIAGNOSIS — Z794 Long term (current) use of insulin: Secondary | ICD-10-CM

## 2018-11-11 DIAGNOSIS — D649 Anemia, unspecified: Secondary | ICD-10-CM | POA: Diagnosis not present

## 2018-11-11 DIAGNOSIS — E083299 Diabetes mellitus due to underlying condition with mild nonproliferative diabetic retinopathy without macular edema, unspecified eye: Secondary | ICD-10-CM

## 2018-11-11 LAB — VITAMIN B12: Vitamin B-12: 1247 pg/mL — ABNORMAL HIGH (ref 211–911)

## 2018-11-11 LAB — FOLATE: Folate: >23.3 ng/mL (ref >5.9)

## 2018-11-11 NOTE — Progress Notes (Signed)
Patient and lab staff wore face mask per COVID-19 protocol.   Patient was given new pair of gloves to replace the ones she came in with after blood draw from RT hand.

## 2018-11-12 LAB — MICROALBUMIN / CREATININE URINE RATIO
Creatinine, Urine: 74 mg/dL (ref 20–275)
Microalb Creat Ratio: 20 mcg/mg creat (ref <30)
Microalb, Ur: 1.5 mg/dL

## 2018-11-12 NOTE — Progress Notes (Signed)
We will contact her for a telehealth visit [facility].

## 2018-11-12 NOTE — Progress Notes (Signed)
Ok thank you! I appreciate it and I know the pt will.

## 2018-11-25 ENCOUNTER — Ambulatory Visit: Payer: Self-pay | Admitting: Internal Medicine

## 2018-12-03 ENCOUNTER — Ambulatory Visit: Payer: MEDICARE | Admitting: Psychology

## 2018-12-07 ENCOUNTER — Ambulatory Visit: Payer: MEDICARE | Admitting: Psychology

## 2018-12-17 DIAGNOSIS — M48061 Spinal stenosis, lumbar region without neurogenic claudication: Secondary | ICD-10-CM | POA: Diagnosis not present

## 2018-12-17 DIAGNOSIS — I10 Essential (primary) hypertension: Secondary | ICD-10-CM | POA: Diagnosis not present

## 2018-12-17 DIAGNOSIS — Z6833 Body mass index (BMI) 33.0-33.9, adult: Secondary | ICD-10-CM | POA: Diagnosis not present

## 2018-12-18 ENCOUNTER — Other Ambulatory Visit: Payer: Self-pay | Admitting: Family Medicine

## 2018-12-29 ENCOUNTER — Ambulatory Visit: Payer: MEDICARE | Admitting: Psychology

## 2019-01-06 ENCOUNTER — Telehealth: Payer: Self-pay

## 2019-01-06 NOTE — Telephone Encounter (Signed)
I spoke with Beth Maldonado and she is doing fine, only saw the bleeding once and it was bright red. She thinks it was because she was straining. She will contact us if she has any other problems. She appreciated our call.

## 2019-01-06 NOTE — Telephone Encounter (Signed)
-----   Message from Gatha Mayer, MD sent at 01/06/2019  2:31 PM EDT ----- Regarding: f/u re: cancelled visit She cancelled a 4/29 visit for rectal bleeding  Please f/u her - offer another visit if she wants - can be in person

## 2019-01-07 ENCOUNTER — Other Ambulatory Visit: Payer: Self-pay | Admitting: Family Medicine

## 2019-01-07 NOTE — Telephone Encounter (Signed)
Dr. Loletha Grayer please advise okay to send? Metformin has never been prescribed by you. Last OV 10/2018

## 2019-01-08 ENCOUNTER — Ambulatory Visit (INDEPENDENT_AMBULATORY_CARE_PROVIDER_SITE_OTHER): Payer: MEDICARE | Admitting: Psychology

## 2019-01-08 DIAGNOSIS — F324 Major depressive disorder, single episode, in partial remission: Secondary | ICD-10-CM | POA: Diagnosis not present

## 2019-01-18 ENCOUNTER — Ambulatory Visit: Payer: MEDICARE | Admitting: Psychology

## 2019-01-18 DIAGNOSIS — M48061 Spinal stenosis, lumbar region without neurogenic claudication: Secondary | ICD-10-CM | POA: Diagnosis not present

## 2019-02-02 ENCOUNTER — Ambulatory Visit (INDEPENDENT_AMBULATORY_CARE_PROVIDER_SITE_OTHER): Payer: MEDICARE | Admitting: Psychology

## 2019-02-02 DIAGNOSIS — F329 Major depressive disorder, single episode, unspecified: Secondary | ICD-10-CM | POA: Diagnosis not present

## 2019-02-15 DIAGNOSIS — M48061 Spinal stenosis, lumbar region without neurogenic claudication: Secondary | ICD-10-CM | POA: Diagnosis not present

## 2019-02-15 DIAGNOSIS — M4316 Spondylolisthesis, lumbar region: Secondary | ICD-10-CM | POA: Diagnosis not present

## 2019-02-18 ENCOUNTER — Ambulatory Visit (INDEPENDENT_AMBULATORY_CARE_PROVIDER_SITE_OTHER): Payer: MEDICARE | Admitting: Psychology

## 2019-02-18 DIAGNOSIS — F329 Major depressive disorder, single episode, unspecified: Secondary | ICD-10-CM

## 2019-02-19 ENCOUNTER — Telehealth: Payer: Self-pay | Admitting: Family Medicine

## 2019-02-19 ENCOUNTER — Other Ambulatory Visit: Payer: Self-pay | Admitting: Family Medicine

## 2019-02-19 NOTE — Telephone Encounter (Signed)
Received medication refills of hydroxyzine and metformin this morning 02/19/2019. Both has been refilled let a detailed message on pt VM stating I sent those medications and if she had any other medications she needed refilled to let us know. Thanks

## 2019-02-19 NOTE — Telephone Encounter (Signed)
Patient called saying that she was having some chect tightness and arm pain that has been going on for about a week. I did talk with Dr. Loletha Grayer about her sx and Dr. Loletha Grayer said that the patient could be seen in the office today, 02/19/2019 at 1:00pm. Patient states that she could not come in today and that she would wait until next week to be seen. She has prior engagements to be at this afternoon.I did schedule the patient to come in on 02/25/2019 at 2:00 pm. I did tell the patient that if her chest pain get worst, to go to the ED. Patient also mention to me that she has been out of her Metformin and other medication for about a week. Please advise.

## 2019-02-25 ENCOUNTER — Ambulatory Visit: Payer: MEDICARE | Admitting: Family Medicine

## 2019-03-05 ENCOUNTER — Ambulatory Visit (INDEPENDENT_AMBULATORY_CARE_PROVIDER_SITE_OTHER): Payer: MEDICARE | Admitting: Psychology

## 2019-03-05 DIAGNOSIS — F329 Major depressive disorder, single episode, unspecified: Secondary | ICD-10-CM | POA: Diagnosis not present

## 2019-03-15 ENCOUNTER — Other Ambulatory Visit: Payer: Self-pay | Admitting: Family Medicine

## 2019-03-19 ENCOUNTER — Ambulatory Visit (INDEPENDENT_AMBULATORY_CARE_PROVIDER_SITE_OTHER): Payer: MEDICARE | Admitting: Psychology

## 2019-03-19 ENCOUNTER — Other Ambulatory Visit: Payer: Self-pay

## 2019-03-19 DIAGNOSIS — F329 Major depressive disorder, single episode, unspecified: Secondary | ICD-10-CM | POA: Diagnosis not present

## 2019-03-19 MED ORDER — TRIAMTERENE-HCTZ 37.5-25 MG PO TABS: 1.0000 | ORAL_TABLET | Freq: Every day | ORAL | 3 refills | Status: DC

## 2019-03-19 MED ORDER — ATORVASTATIN CALCIUM 10 MG PO TABS: 10.0000 mg | ORAL_TABLET | Freq: Every day | ORAL | 3 refills | Status: DC

## 2019-03-31 ENCOUNTER — Telehealth: Payer: Self-pay

## 2019-03-31 DIAGNOSIS — I10 Essential (primary) hypertension: Secondary | ICD-10-CM

## 2019-03-31 DIAGNOSIS — E083299 Diabetes mellitus due to underlying condition with mild nonproliferative diabetic retinopathy without macular edema, unspecified eye: Secondary | ICD-10-CM

## 2019-03-31 NOTE — Telephone Encounter (Signed)
Will place orders for lab appt tomorrow at 8:30am but I may not have all results back by tomorrow afternoon? Pt may want to have labs done and then schedule VV for the following week

## 2019-03-31 NOTE — Telephone Encounter (Signed)
Spoke with pt, pt decided to just go ahead and get labs done and change virtual appointment to 9/16 to discuss labs.

## 2019-03-31 NOTE — Telephone Encounter (Signed)
Copied from [place] [phone]. Topic: General - Other >> Mar 31, 2019  1:18 PM Rainey Pines A wrote: Patient would like to speak with nurse because she was under the impression her appt for tomorrow was for a check up on her diabetes and lab work. I advised patient that there werent any labs that were ordered. Patient stated that she is no longer experiencing the previous symptom of chest tightness.

## 2019-03-31 NOTE — Telephone Encounter (Signed)
Dr. Loletha Grayer please advise pt would like to come in early for her labs to get drawn and have a virtual appointment to discuss DM at the scheduled time. Pt would like orders placed for her to come in at 830 tomorrow if possible.

## 2019-04-01 ENCOUNTER — Other Ambulatory Visit (INDEPENDENT_AMBULATORY_CARE_PROVIDER_SITE_OTHER): Payer: MEDICARE

## 2019-04-01 ENCOUNTER — Ambulatory Visit: Payer: MEDICARE | Admitting: Family Medicine

## 2019-04-01 DIAGNOSIS — Z794 Long term (current) use of insulin: Secondary | ICD-10-CM | POA: Diagnosis not present

## 2019-04-01 DIAGNOSIS — I10 Essential (primary) hypertension: Secondary | ICD-10-CM | POA: Diagnosis not present

## 2019-04-01 DIAGNOSIS — E083299 Diabetes mellitus due to underlying condition with mild nonproliferative diabetic retinopathy without macular edema, unspecified eye: Secondary | ICD-10-CM | POA: Diagnosis not present

## 2019-04-01 LAB — BASIC METABOLIC PANEL
BUN: 18 mg/dL (ref 6–23)
CO2: 28 mEq/L (ref 19–32)
Calcium: 9.4 mg/dL (ref 8.4–10.5)
Chloride: 103 mEq/L (ref 96–112)
Creatinine, Ser: 1.47 mg/dL — ABNORMAL HIGH (ref 0.40–1.20)
GFR: 42.13 mL/min — ABNORMAL LOW (ref >60.00)
Glucose, Bld: 105 mg/dL — ABNORMAL HIGH (ref 70–99)
Potassium: 3.7 mEq/L (ref 3.5–5.1)
Sodium: 139 mEq/L (ref 135–145)

## 2019-04-01 LAB — LIPID PANEL
Cholesterol: 132 mg/dL (ref 0–200)
HDL: 52.4 mg/dL (ref >39.00)
LDL Cholesterol: 64 mg/dL (ref 0–99)
NonHDL: 79.65
Total CHOL/HDL Ratio: 3
Triglycerides: 80 mg/dL (ref 0.0–149.0)
VLDL: 16 mg/dL (ref 0.0–40.0)

## 2019-04-01 LAB — AST: AST: 21 U/L (ref 0–37)

## 2019-04-01 LAB — HEMOGLOBIN A1C: Hgb A1c MFr Bld: 7.8 % — ABNORMAL HIGH (ref 4.6–6.5)

## 2019-04-01 LAB — ALT: ALT: 16 U/L (ref 0–35)

## 2019-04-08 ENCOUNTER — Other Ambulatory Visit: Payer: MEDICARE

## 2019-04-08 ENCOUNTER — Other Ambulatory Visit: Payer: Self-pay

## 2019-04-08 DIAGNOSIS — E083299 Diabetes mellitus due to underlying condition with mild nonproliferative diabetic retinopathy without macular edema, unspecified eye: Secondary | ICD-10-CM | POA: Diagnosis not present

## 2019-04-08 DIAGNOSIS — Z794 Long term (current) use of insulin: Secondary | ICD-10-CM

## 2019-04-08 LAB — MICROALBUMIN / CREATININE URINE RATIO
Creatinine,U: 49.9 mg/dL
Microalb Creat Ratio: 1.4 mg/g (ref 0.0–30.0)
Microalb, Ur: <0.7 mg/dL (ref 0.0–1.9)

## 2019-04-13 ENCOUNTER — Other Ambulatory Visit: Payer: Self-pay | Admitting: Family Medicine

## 2019-04-13 MED ORDER — TOUJEO SOLOSTAR 300 UNIT/ML ~~LOC~~ SOPN: 24.0000 [IU] | PEN_INJECTOR | Freq: Every day | SUBCUTANEOUS | 0 refills | Status: DC

## 2019-04-13 NOTE — Telephone Encounter (Signed)
[place] please advise on absence of Dr. Bryan Lemma. Thanks.

## 2019-04-13 NOTE — Telephone Encounter (Signed)
Requested medication (s) are due for refill today: yes  Requested medication (s) are on the active medication list: yes  Last refill:  08/25/2018  Future visit scheduled: yes  Notes to clinic:  Review for refill   Requested Prescriptions  Pending Prescriptions Disp Refills   Insulin Glargine, 1 Unit Dial, (TOUJEO SOLOSTAR) 300 UNIT/ML SOPN 9 mL 1     Endocrinology:  Diabetes - Insulins Passed - 04/13/2019  9:24 AM      Passed - HBA1C is between 0 and 7.9 and within 180 days    Hemoglobin A1c  Date Value Ref Range Status  04/03/2016 6.6 (H) 4.8 - 5.6 % Final    Comment:             Pre-diabetes: 5.7 - 6.4          Diabetes: >6.4          Glycemic control for adults with diabetes: <7.0    HbA1c POC (<> result, manual entry)  Date Value Ref Range Status  09/04/2018 10 4.0 - 5.6 % Final   Hgb A1c MFr Bld  Date Value Ref Range Status  04/01/2019 7.8 (H) 4.6 - 6.5 % Final    Comment:    Glycemic Control Guidelines for People with Diabetes:Non Diabetic:  <6%Goal of Therapy: <7%Additional Action Suggested:  >8%          Passed - Valid encounter within last 6 months    Recent Outpatient Visits          5 months ago Uncontrolled type 2 diabetes mellitus with hyperglycemia [facility])   [facility] [place], [place] K, DO   5 months ago Shortness of breath   [facility] [place], [place], DO   7 months ago 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, with long-term current use of insulin, unspecified laterality [facility])   [facility] [place], [place] K, DO   10 months ago 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

## 2019-04-13 NOTE — Telephone Encounter (Signed)
Medication: TOUJEO SOLOSTAR 300 UNIT/ML SOPN    Patient is requesting a refill     Pharmacy  [place], [place] [phone] (Phone) [phone] (Fax)

## 2019-04-14 ENCOUNTER — Other Ambulatory Visit: Payer: Self-pay

## 2019-04-14 ENCOUNTER — Ambulatory Visit (INDEPENDENT_AMBULATORY_CARE_PROVIDER_SITE_OTHER): Payer: MEDICARE | Admitting: Psychology

## 2019-04-14 ENCOUNTER — Ambulatory Visit (INDEPENDENT_AMBULATORY_CARE_PROVIDER_SITE_OTHER): Payer: MEDICARE | Admitting: Family Medicine

## 2019-04-14 ENCOUNTER — Encounter: Payer: Self-pay | Admitting: Family Medicine

## 2019-04-14 VITALS — Ht 61.0 in

## 2019-04-14 DIAGNOSIS — Z794 Long term (current) use of insulin: Secondary | ICD-10-CM | POA: Diagnosis not present

## 2019-04-14 DIAGNOSIS — I10 Essential (primary) hypertension: Secondary | ICD-10-CM

## 2019-04-14 DIAGNOSIS — E782 Mixed hyperlipidemia: Secondary | ICD-10-CM

## 2019-04-14 DIAGNOSIS — E118 Type 2 diabetes mellitus with unspecified complications: Secondary | ICD-10-CM

## 2019-04-14 DIAGNOSIS — F329 Major depressive disorder, single episode, unspecified: Secondary | ICD-10-CM | POA: Diagnosis not present

## 2019-04-14 MED ORDER — METFORMIN HCL 500 MG PO TABS: ORAL_TABLET | ORAL | 3 refills | Status: DC

## 2019-04-14 MED ORDER — HYDROXYZINE HCL 25 MG PO TABS: ORAL_TABLET | ORAL | 3 refills | Status: DC

## 2019-04-14 NOTE — Progress Notes (Signed)
Virtual Visit via Telephone Note  I connected with Beth Maldonado on 04/14/19 at  2:45 PM EDT by telephone and verified that I am speaking with the correct person using two identifiers.   I discussed the limitations, risks, security and privacy concerns of performing an evaluation and management service by telephone and the availability of in person appointments. I also discussed with the patient that there may be a patient responsible charge related to this service. The patient expressed understanding and agreed to proceed.  Location patient: home Location provider: home office Participants present for the call: patient, provider Patient did not have a visit in the prior 7 days to address this/these issue(s).  Chief Complaint  Patient presents with  . Follow-up    go over lab work on 04/01/2019--metformin refill?     History of Present Illness: Beth Maldonado is a 73 y.o. female to f/u on DM, hyperlipidemia, HTN. Her most recent A1C was down to 7.8 from 11.9. she is taking ASA 81mg  daily,  She has been "eating properly" and is doing "stretching exercises" daily.  She needs a refill on metformin 500mg  2 tabs BID.  She also requests refill of hydroxyzine 25mg  1/2 to 1 tab PRN itching.  She is taking BP med as directed. No issues with HA, vision changes, dizziness, CP, SOB, LE edema.  Lab Results  Component Value Date   HGBA1C 7.8 (H) 04/01/2019     Past Medical History:  Diagnosis Date  . Acute bronchitis 03/29/2013  . Acute upper respiratory infection 11/27/2014  . Allergy   . Anemia 07/02/2015  . Arthritis   . Back pain    upper and lower last 20 years  . Cancer of left colon ([place]) 12/18/2015  . Depression with anxiety 06/27/2013  . Diabetes mellitus   . Hx of adenomatous colonic polyps 10/27/2015  . Hyperlipidemia   . Hypertension   . Insomnia 06/30/2015  . Medicare annual wellness visit, subsequent 03/23/2014  . Neck pain on left side 05/29/2014   hx of  . Neuromuscular  disorder ([facility])    neuropathy feet  . Neuropathy   . Osteopenia   . Preventative health care 07/02/2015  . Tinnitus    RIGHT EAR ALL THE TIME    Past Surgical History:  Procedure Laterality Date  . BREAST ENHANCEMENT SURGERY Bilateral 1987   Reduction  . CATARACT EXTRACTION, BILATERAL  2011  . COLONOSCOPY  2017   colon cancer  . [place]  . KNEE ARTHROSCOPY Right 2003  . LAPAROSCOPIC SIGMOID COLECTOMY N/A 12/18/2015   Procedure: LAPAROSCOPIC SIGMOID COLECTOMY;  Surgeon: Alphonsa Overall, MD;  Location: [facility];  Service: General;  Laterality: N/A;  . TUBAL LIGATION  1970  . VAGINAL HYSTERECTOMY  1974   partial    Social History   Tobacco Use  . Smoking status: Former Smoker    Packs/day: 1.00    Years: 20.00    Pack years: 20.00    Types: Cigarettes    Start date: 10/03/1966    Quit date: 11/03/1986    Years since quitting: 32.4  . Smokeless tobacco: Never Used  . Tobacco comment: quit 28 years ago  Substance Use Topics  . Alcohol use: Yes    Alcohol/week: 0.0 standard drinks    Comment: Rare- Special Occasional   . Drug use: No    Family History  Problem Relation Age of Onset  . Diabetes Mother   . Heart attack Father   . Heart  disease Father        29 and 3 MI  . Diabetes Sister   . Sickle cell anemia Sister   . Arthritis Sister   . Hypertension Sister   . Diabetes Sister   . Thyroid disease Sister   . Arthritis Sister   . Glaucoma Sister   . Esophageal cancer Neg Hx   . Stomach cancer Neg Hx   . Colon cancer Neg Hx     Outpatient Encounter Medications as of 04/14/2019  Medication Sig  . albuterol (PROVENTIL HFA;VENTOLIN HFA) 108 (90 Base) MCG/ACT inhaler Inhale 2 puffs into the lungs every 6 (six) hours as needed for wheezing or shortness of breath.  Marland Kitchen aspirin EC 81 MG tablet Take 1 tablet (81 mg total) by mouth daily.  Marland Kitchen atorvastatin (LIPITOR) 10 MG tablet Take 1 tablet (10 mg total) by mouth daily.  . Biotin 5000 MCG CAPS Take 5,000 mcg by  mouth.  . escitalopram (LEXAPRO) 20 MG tablet TAKE 1 TABLET BY MOUTH  DAILY  . ferrous sulfate (GNP IRON) 325 (65 FE) MG tablet Take 1 tablet (325 mg total) by mouth daily with breakfast.  . gabapentin (NEURONTIN) 300 MG capsule TAKE 2 CAPSULES BY MOUTH 3  TIMES DAILY  . hydrOXYzine (ATARAX/VISTARIL) 25 MG tablet TAKE HALF TO ONE TABLET BY MOUTH EVERY EIGHT HOURS AS NEEDED FOR ITCHING  . Insulin Glargine, 1 Unit Dial, (TOUJEO SOLOSTAR) 300 UNIT/ML SOPN Inject 24 Units into the skin daily.  . insulin lispro (HUMALOG KWIKPEN) 100 UNIT/ML KiwkPen INJECT FIVE UNITS  SUBCUTANEOUSLY WITH  BREAKFAST AND DINNER AND 7  UNITS AT LUNCH  . Insulin Pen Needle 32G X 4 MM MISC Use for insulin injection twice a day 250.00  . metFORMIN (GLUCOPHAGE) 500 MG tablet TAKE 2 TABLETS BY MOUTH TWO TIMES DAILY  . Multiple Vitamin (MULTIVITAMIN) tablet Take 1 tablet by mouth daily.  Marland Kitchen triamterene-hydrochlorothiazide (MAXZIDE-25) 37.5-25 MG tablet Take 1 tablet by mouth daily.  . [DISCONTINUED] hydrOXYzine (ATARAX/VISTARIL) 25 MG tablet TAKE HALF TO ONE TABLET BY MOUTH EVERY EIGHT HOURS AS NEEDED FOR ITCHING  . [DISCONTINUED] metFORMIN (GLUCOPHAGE) 500 MG tablet TAKE 2 TABLETS BY MOUTH TWO TIMES DAILY  . lisinopril (PRINIVIL,ZESTRIL) 5 MG tablet Take 1 tablet (5 mg total) by mouth 2 (two) times daily. (Patient not taking: Reported on 04/14/2019)  . [DISCONTINUED] acetaminophen (TYLENOL) 500 MG tablet Take 1 tablet (500 mg total) by mouth every 6 (six) hours as needed. (Patient not taking: Reported on 11/03/2018)  . [DISCONTINUED] benzonatate (TESSALON) 100 MG capsule Take 1 capsule (100 mg total) by mouth 3 (three) times daily as needed for cough. (Patient not taking: Reported on 11/03/2018)  . [DISCONTINUED] Continuous Blood Gluc Receiver (FREESTYLE LIBRE 14 DAY READER) DEVI 1 Device by Does not apply route 3 (three) times daily as needed. (Patient not taking: Reported on 04/14/2019)  . [DISCONTINUED] glucose blood (ONETOUCH VERIO)  test strip Use four times daily to check blood sugar.  DX  E11.9 (Patient not taking: Reported on 04/14/2019)  . [DISCONTINUED] hydrochlorothiazide (HYDRODIURIL) 25 MG tablet TAKE 1 TABLET BY MOUTH  DAILY (Patient not taking: Reported on 04/14/2019)  . [DISCONTINUED] lidocaine (LIDODERM) 5 % Place 1 patch onto the skin daily. Remove & Discard patch within 12 hours or as directed by MD (Patient not taking: Reported on 04/14/2019)  . [DISCONTINUED] ONETOUCH DELICA LANCETS 99991111 MISC Use as directed to check blood sugar 4 times per day dx code E11.9 (Patient not taking: Reported on 04/14/2019)  No facility-administered encounter medications on file as of 04/14/2019.      Allergies  Allergen Reactions  . Penicillins Swelling and Other (See Comments)    tongue swelled per pt Has patient had a PCN reaction causing immediate rash, facial/tongue/throat swelling, SOB or lightheadedness with hypotension: yes Has patient had a PCN reaction causing severe rash involving mucus membranes or skin necrosis: no Has patient had a PCN reaction that required hospitalization no  Has patient had a PCN reaction occurring within the last 10 years: unsure If all of the above answers are "NO", then may proceed with Cephalosporin use.       ROS: See pertinent positives and negatives per HPI.   Observations/Objective: Patient sounds cheerful and well on the phone. I do not appreciate any SOB. Speech and thought processing are grossly intact. Patient reported vitals:  Ht 5\' 1"  (1.549 m)   BMI 32.91 kg/m    Assessment and Plan:  1. Controlled type 2 diabetes mellitus with complication, with long-term current use of insulin (HCC) - congratulated pt on much-improved A1C! - cont current meds, dietary changes, and activity Refill: - metFORMIN (GLUCOPHAGE) 500 MG tablet; TAKE 2 TABLETS BY MOUTH TWO TIMES DAILY  Dispense: 360 tablet; Refill: 3 - f/u in 3 mo  2. Mixed hyperlipidemia - at goal - cont lipitor - f/u  in 6-12 mo  3. Essential hypertension - controlled, cont current meds   I did not refer this patient for an OV in the next 24 hours for this/these issue(s).  I discussed the assessment and treatment plan with the patient. The patient was provided an opportunity to ask questions and all were answered. The patient agreed with the plan and demonstrated an understanding of the instructions.   The patient was advised to call back or seek an in-person evaluation if the symptoms worsen or if the condition fails to improve as anticipated.  I provided 18 minutes of non-face-to-face time during this encounter.   Letta Median, DO

## 2019-04-20 DIAGNOSIS — M48061 Spinal stenosis, lumbar region without neurogenic claudication: Secondary | ICD-10-CM | POA: Diagnosis not present

## 2019-05-07 ENCOUNTER — Ambulatory Visit: Payer: MEDICARE | Admitting: Psychology

## 2019-05-10 DIAGNOSIS — Z23 Encounter for immunization: Secondary | ICD-10-CM | POA: Diagnosis not present

## 2019-05-13 ENCOUNTER — Other Ambulatory Visit: Payer: Self-pay | Admitting: Family Medicine

## 2019-05-19 DIAGNOSIS — I10 Essential (primary) hypertension: Secondary | ICD-10-CM | POA: Diagnosis not present

## 2019-05-19 DIAGNOSIS — M47816 Spondylosis without myelopathy or radiculopathy, lumbar region: Secondary | ICD-10-CM | POA: Diagnosis not present

## 2019-05-19 DIAGNOSIS — Z6832 Body mass index (BMI) 32.0-32.9, adult: Secondary | ICD-10-CM | POA: Diagnosis not present

## 2019-05-19 DIAGNOSIS — M48061 Spinal stenosis, lumbar region without neurogenic claudication: Secondary | ICD-10-CM | POA: Diagnosis not present

## 2019-05-26 ENCOUNTER — Other Ambulatory Visit: Payer: Self-pay

## 2019-05-26 MED ORDER — INSULIN LISPRO (1 UNIT DIAL) 100 UNIT/ML (KWIKPEN): PEN_INJECTOR | SUBCUTANEOUS | 3 refills | Status: DC

## 2019-05-29 ENCOUNTER — Other Ambulatory Visit: Payer: Self-pay | Admitting: Family Medicine

## 2019-07-07 ENCOUNTER — Other Ambulatory Visit: Payer: Self-pay | Admitting: Family Medicine

## 2019-07-07 MED ORDER — ESCITALOPRAM OXALATE 20 MG PO TABS: 20.0000 mg | ORAL_TABLET | Freq: Every day | ORAL | 1 refills | Status: DC

## 2019-07-08 ENCOUNTER — Other Ambulatory Visit: Payer: Self-pay | Admitting: Family Medicine

## 2019-07-08 NOTE — Telephone Encounter (Signed)
Pt stated she is only taking Maxzide, not just hydrochlorothiazide. Pt still taking hydroxyzine for PRN itchy.    Fax back response to [facility].

## 2019-07-08 NOTE — Telephone Encounter (Signed)
Perfect. Thank you for the clarification.

## 2019-07-08 NOTE — Telephone Encounter (Signed)
At last OV w/ me in 03/2019, I had confirmed pt was not taking HCTZ but is taking maxzide (triamterene-hydrochlorothiazide 37.5-25mg ). She was also confusing hydroxyzine (which she takes PRN for itching) with hydrochlorothiazide.   Can you please call pt to confirm she is taking: 1. Hydroxyzine 25mg  tab 1/2 to 1 tab PRN itching 2. Triamterene-hydrochlorothiazide 37.5-25mg  1 tab daily  Please confirm she is not also taking hydrochlorothiazide 25mg  1 tab daily

## 2019-07-08 NOTE — Telephone Encounter (Signed)
Pt request refill for hydrochlorothiazide tab send into [facility]. This rx discontinue 03/2019 by Dr. Charlett Blake. Please advise.

## 2019-08-02 LAB — HM DIABETES EYE EXAM

## 2019-08-04 ENCOUNTER — Encounter: Payer: Self-pay | Admitting: Family Medicine

## 2019-08-10 ENCOUNTER — Other Ambulatory Visit: Payer: Self-pay

## 2019-08-10 ENCOUNTER — Telehealth: Payer: Self-pay | Admitting: Family Medicine

## 2019-08-10 NOTE — Telephone Encounter (Signed)
Sent a TE to Dr.C for advise.

## 2019-08-10 NOTE — Telephone Encounter (Signed)
Patient is calling and is requesting Toujeo be sent to [facility] on [address] due to the mail service not being able to deliver to address. CB is [phone].

## 2019-08-10 NOTE — Telephone Encounter (Signed)
Pt requesting a refill for [facility] to [facility] to [facility] on [address]. Last ov 04/14/19 Last fill 05/31/19  #21ml/3

## 2019-08-11 DIAGNOSIS — M48061 Spinal stenosis, lumbar region without neurogenic claudication: Secondary | ICD-10-CM | POA: Diagnosis not present

## 2019-08-11 DIAGNOSIS — M5416 Radiculopathy, lumbar region: Secondary | ICD-10-CM | POA: Diagnosis not present

## 2019-08-11 MED ORDER — TOUJEO SOLOSTAR 300 UNIT/ML ~~LOC~~ SOPN: 22.0000 [IU] | PEN_INJECTOR | Freq: Every day | SUBCUTANEOUS | 3 refills | Status: DC

## 2019-10-01 ENCOUNTER — Ambulatory Visit: Payer: MEDICARE | Attending: Internal Medicine

## 2019-10-01 ENCOUNTER — Other Ambulatory Visit: Payer: Self-pay

## 2019-10-01 DIAGNOSIS — Z23 Encounter for immunization: Secondary | ICD-10-CM

## 2019-10-01 NOTE — Progress Notes (Signed)
   Covid-19 Vaccination Clinic  Name:  Beth Maldonado    MRN: [id number] DOB: 08-27-1945  10/01/2019  [name] was observed post Covid-19 immunization for 15 minutes without incident. She was provided with Vaccine Information Sheet and instruction to access the V-Safe system.   [name] was instructed to call 911 with any severe reactions post vaccine: Marland Kitchen Difficulty breathing  . Swelling of face and throat  . A fast heartbeat  . A bad rash all over body  . Dizziness and weakness   Immunizations Administered    Name Date Dose VIS Date Route   Pfizer COVID-19 Vaccine 10/01/2019  1:42 PM 0.3 mL 07/09/2019 Intramuscular   Manufacturer: [place]   Lot: [id number]   [place]: [id number]

## 2019-10-14 ENCOUNTER — Other Ambulatory Visit: Payer: Self-pay | Admitting: Family Medicine

## 2019-10-14 NOTE — Telephone Encounter (Signed)
Last OV 04/14/2019 Last fill 04/14/19  #90/3

## 2019-10-18 ENCOUNTER — Telehealth: Payer: Self-pay | Admitting: Family Medicine

## 2019-10-18 DIAGNOSIS — I10 Essential (primary) hypertension: Secondary | ICD-10-CM

## 2019-10-18 DIAGNOSIS — Z794 Long term (current) use of insulin: Secondary | ICD-10-CM

## 2019-10-18 DIAGNOSIS — E083299 Diabetes mellitus due to underlying condition with mild nonproliferative diabetic retinopathy without macular edema, unspecified eye: Secondary | ICD-10-CM

## 2019-10-18 DIAGNOSIS — M5416 Radiculopathy, lumbar region: Secondary | ICD-10-CM | POA: Diagnosis not present

## 2019-10-18 NOTE — Chronic Care Management (AMB) (Signed)
  Chronic Care Management   Note  10/18/2019 Name: Beth Maldonado MRN: [id number] DOB: Aug 20, 1945  Beth Maldonado is a 74 y.o. year old female who is a primary care patient of Ronnald Nian, DO. I reached out to [name] by phone today in response to a referral sent by [name] PCP, Ronnald Nian, DO.   [name] was given information about Chronic Care Management services today including:  1. CCM service includes personalized support from designated clinical staff supervised by her physician, including individualized plan of care and coordination with other care providers 2. 24/7 contact phone numbers for assistance for urgent and routine care needs. 3. Service will only be billed when office clinical staff spend 20 minutes or more in a month to coordinate care. 4. Only one practitioner may furnish and bill the service in a calendar month. 5. The patient may stop CCM services at any time (effective at the end of the month) by phone call to the office staff.   Patient agreed to services and verbal consent obtained.   Follow up plan:  [place]

## 2019-10-18 NOTE — Addendum Note (Signed)
Addended by: Darral Dash on: 10/18/2019 03:47 PM   Modules accepted: Orders

## 2019-10-20 NOTE — Chronic Care Management (AMB) (Signed)
Chronic Care Management Pharmacy  Name: Beth Maldonado  MRN: [id number] DOB: 04-03-1946  Chief Complaint/ HPI  Beth Maldonado,  74 y.o. , female presents for their Initial CCM visit with the clinical pharmacist via telephone due to COVID-19 Pandemic.  PCP : Beth Nian, DO  Their chronic conditions include: type 2 Diabetes, hypertension, hyperlipidemia, depression, chronic back pain, neuropathy.  Office Visits: 04/14/19: TE with Dr. Bryan Maldonado for DM follow-up. A1c improved from 11.9% to 7.8%. Patient working on diet exercise. No medication changes made.   Consult Visits: No recent consults noted.  Medications: Outpatient Encounter Medications as of 10/21/2019  Medication Sig  . aspirin EC 81 MG tablet Take 1 tablet (81 mg total) by mouth daily.  Marland Kitchen atorvastatin (LIPITOR) 10 MG tablet Take 1 tablet (10 mg total) by mouth daily.  . Biotin 5000 MCG CAPS Take 5,000 mcg by mouth.  . escitalopram (LEXAPRO) 20 MG tablet Take 1 tablet (20 mg total) by mouth daily.  . ferrous sulfate (GNP IRON) 325 (65 FE) MG tablet Take 1 tablet (325 mg total) by mouth daily with breakfast.  . gabapentin (NEURONTIN) 300 MG capsule TAKE 2 CAPSULES BY MOUTH 3  TIMES DAILY (Patient taking differently: Take 600 mg by mouth 2 (two) times daily. )  . hydrOXYzine (ATARAX/VISTARIL) 25 MG tablet TAKE 1/2 TO 1 TABLET BY  MOUTH EVERY 8 HOURS AS  NEEDED FOR ITCHING  . Insulin Glargine, 1 Unit Dial, (TOUJEO SOLOSTAR) 300 UNIT/ML SOPN Inject 22 Units into the skin daily. Dx.E11.9  . insulin lispro (HUMALOG KWIKPEN) 100 UNIT/ML KwikPen Inject 7 units every morning and 7 units every evening.  . metFORMIN (GLUCOPHAGE) 500 MG tablet TAKE 2 TABLETS BY MOUTH TWO TIMES DAILY  . Multiple Vitamin (MULTIVITAMIN) tablet Take 1 tablet by mouth daily.  Marland Kitchen triamterene-hydrochlorothiazide (MAXZIDE-25) 37.5-25 MG tablet Take 1 tablet by mouth daily.  Marland Kitchen albuterol (PROVENTIL HFA;VENTOLIN HFA) 108 (90 Base) MCG/ACT inhaler Inhale 2  puffs into the lungs every 6 (six) hours as needed for wheezing or shortness of breath. (Patient not taking: Reported on 10/21/2019)  . Insulin Pen Needle 32G X 4 MM MISC Use for insulin injection twice a day 250.00  . lisinopril (PRINIVIL,ZESTRIL) 5 MG tablet Take 1 tablet (5 mg total) by mouth 2 (two) times daily. (Patient not taking: Reported on 04/14/2019)   No facility-administered encounter medications on file as of 10/21/2019.     Current Diagnosis/Assessment:  Goals Addressed            This Visit's Progress   . Chronic Care Management       CARE PLAN ENTRY  Current Barriers:  . Chronic Disease Management support, education, and care coordination needs related to type 2 diabetes, hypertension, hyperlipidemia, depression, chronic back pain, neuropathy.  Clinical Goal(s): Over the next 7 days, patient will:  . Work with the care management team to address educational, disease management, and care coordination needs  . Begin or continue self health monitoring activities as directed today  . Call provider office for new or worsened signs and symptoms  . Call care management team with questions or concerns . Maintain A1c < 8% . Achieve blood pressure <140/90  . Maintain LDL (bad cholesterol) <100  Interventions:  . Evaluation of current treatment plans and patient's adherence to plan as established by provider . Assessed patient understanding of disease states . Assessed patient's education and care coordination needs . Provided disease specific education to patient  . Use Freestyle [employer]  at least three times daily to check blood sugars (before breakfast, dinner, bedtime and any time you feel symptoms of low blood sugar) . Use blood pressure monitor to check blood pressure at least once weekly  . Limit coffee to no more than 6-7 cups each day.   Face to Face appointment with pharmacist scheduled for:  10/28/19 at 11:00 AM     . Increase physical activity   Not on track   .  Increase water intake   Not on track   . Limit coffee to 6-7 cups daily      . Reduce portion size   Not on track      Diabetes   Recent Relevant Labs: Lab Results  Component Value Date/Time   HGBA1C 7.8 (H) 04/01/2019 08:25 AM   HGBA1C 11.9 (H) 10/23/2018 11:52 AM   HGBA1C 10 09/04/2018 01:25 PM   HGBA1C 6.6 (H) 04/03/2016 10:09 AM   EGFR 44 (L) 10/30/2016 08:29 AM   MICROALBUR <0.7 04/08/2019 11:08 AM   MICROALBUR 1.5 11/11/2018 08:58 AM     Checking BG: Never. Has FreeStyle [place], but was never taught how to use it. Recent FBG Readings: n/a Recent pre-meal BG readings: n/a Recent 2hr PP BG readings:  n/a Recent HS BG readings: n/a  Patient has failed these meds in past: Humalog 75/25 Patient is currently controlled on the following medications:   Metformin 1000 mg twice daily  Toujeo 24 units daily  Humalog 8 units twice daily  Aspirin 81 mg daily   A1c goal <8%  Last diabetic Foot exam:  Lab Results  Component Value Date/Time   HMDIABEYEEXA No Retinopathy 08/02/2019 12:00 AM    Last diabetic Eye exam: No results found for: HMDIABFOOTEX   We discussed: diet and exercise extensively   Mainly eats breakfast and dinner. Snacks on fruit, tuna salad, sandwich. Smoothies. Reports diet yesterday included:  Breakfast: Oatmeal w/ cranberries, coffee (multiple cups daily) Snacks: Small pear/apple/tangerine. Limits to size of her palm. Lunch: rarely/never eats.  Dinner: Soup w/ onions, kale, potatoes.   Previously having symptoms Has had symptoms of lows (lightheaded, dizziness, pass out).   Plan  Continue current medications  Will bring patient into clinic in one week to review FreeStyle [place] use.  Hypertension   BP today is: n/a. Patient attempted 3 times to get a BP reading with her home cuff but was unable to use it.   Office blood pressures are  BP Readings from Last 3 Encounters:  10/16/18 (!) 172/82  09/04/18 (!) 142/76  06/03/18 132/70   CMP Latest  Ref Rng & Units 04/01/2019 10/16/2018 05/08/2018  Glucose 70 - 99 mg/dL 105(H) 253(H) 214(H)  BUN 6 - 23 mg/dL '18 15 22  '$ Creatinine 0.40 - 1.20 mg/dL 1.47(H) 1.47(H) 1.49(H)  Sodium 135 - 145 mEq/L 139 139 140  Potassium 3.5 - 5.1 mEq/L 3.7 4.5 4.5  Chloride 96 - 112 mEq/L 103 103 103  CO2 19 - 32 mEq/L '28 28 30  '$ Calcium 8.4 - 10.5 mg/dL 9.4 9.3 9.5  Total Protein 6.0 - 8.3 g/dL - - -  Total Bilirubin 0.2 - 1.2 mg/dL - - -  Alkaline Phos 39 - 117 U/L - - -  AST 0 - 37 U/L 21 - -  ALT 0 - 35 U/L 16 - -  eGFR      42.13   42.19   44.20 CrCl 32 mL/min based on Adj wt of 60kg.  Patient has failed these meds in  the past: amlodipine, losartan, lisinopril  Patient is currently uncontrolled on the following medications: triamterene-HCTZ 37.5-25 mg daily  Patient checks BP at home infrequently  Patient home BP readings are ranging:   We discussed Patient drinks 7-10 cups of coffee (black) day. Likes the taste. She reports she often wakes up in the middle of the night to drink coffee as well. She has been trying to drink more water but does not like the taste. We discussed the impact of caffeine on blood pressure, and patient agreed to try and reduce her coffee intake.     Plan  Continue current medications. Will re-assess appropriateness of restarting lisinopril after seeing BP results. Will bring patient into clinic to review how to use her BP monitor.  Patient agreed to reduce coffee intake to 6-7 cups daily.     Hyperlipidemia   Lipid Panel     Component Value Date/Time   CHOL 132 04/01/2019 0825   TRIG 80.0 04/01/2019 0825   HDL 52.40 04/01/2019 0825   CHOLHDL 3 04/01/2019 0825   VLDL 16.0 04/01/2019 0825   LDLCALC 64 04/01/2019 0825   LDLDIRECT 109.0 09/20/2015 0750     The ASCVD Risk score (Goff DC Jr., et al., 2013) failed to calculate for the following reasons:   The systolic blood pressure is missing   Patient has failed these meds in past: n/a Patient is currently  controlled on the following medications: atorvastatin 10 mg daily LDL goal < 100   We discussed:  Patient tolerating atorvastatin well.  Plan  Continue current medications  Depression/Anxiety   Patient has failed these meds in past: paroxetine, venlafaxine Patient is currently controlled on the following medications: Escitalopram 20 mg daily,    We discussed: Patient has been under a lot of stressors lately. She reports she has lost multiple family members in the past few weeks. She is grieving but states she feels she is doing ok. She is tolerating escitalopram and feels it has been helpful to manage her mood.  Plan  Continue current medications  Chronic Pain/Neuropathy   Patient has failed these meds in past: cyclobenzaprine, tizanidine, methocarbamol Patient is currently managed on the following medications: gabapentin 600 mg TID  We discussed:  Patient receives shots every 3 months for pain. Last shot on 10/15/19. Hydrocodone-APAP 5-325 mg took two pills, had no pain relief. Been cutting back on activity recently which has helped with her overall pain.   For her neuropathy, patient only takes gabapentin 600 mg twice daily. She denies symptoms of CNS depression or oversedation.   Plan  Given patients kidney function, maximum recommended gabapentin is 900 mg daily. Patient slightly over but appears to be tolerating well with no SEs.   Recommend Continuing current medications and continuing to monitor for signs of SEs.  Misc/OTC   Biotin 5000 mcg daily-hair, unsure if she sees any benefit.  Ferrous sulfate 325 mg daily Multivitamin daily Proventil HFA 142mg/act 2 puff q6hr PRN Hydroxyzine 12.5-25 mg q8hr PRN itching- Uses daily for itching.   Plan  Continue current medications  Vaccines   Reviewed and discussed patient's vaccination history.    Immunization History  Administered Date(s) Administered  . Influenza Split 05/21/2011, 06/18/2012  . Influenza Whole  05/12/2013  . Influenza, High Dose Seasonal PF 04/23/2016, 05/15/2017, 03/31/2018  . Influenza,inj,Quad PF,6+ Mos 03/21/2014  . Influenza-Unspecified 04/29/2015  . PFIZER SARS-COV-2 Vaccination 10/01/2019  . Pneumococcal Conjugate-13 03/21/2014  . Pneumococcal Polysaccharide-23 05/21/2011    Plan  Second dose  of [place] Covid vaccine due on 10/27/19.  Medication Management   Pt uses [facility] pharmacy for most medications. Dispensing report showed >5 day gap on fill of atorvastatin, triamterene-HCTZ and metformin.   We discussed:  Importance of daily adherence to chronic medications  Follow up: 1 week to review BP/BG monitoring technique.

## 2019-10-21 ENCOUNTER — Ambulatory Visit: Payer: MEDICARE

## 2019-10-21 ENCOUNTER — Other Ambulatory Visit: Payer: Self-pay | Admitting: Family Medicine

## 2019-10-21 DIAGNOSIS — I10 Essential (primary) hypertension: Secondary | ICD-10-CM

## 2019-10-21 DIAGNOSIS — Z794 Long term (current) use of insulin: Secondary | ICD-10-CM

## 2019-10-21 DIAGNOSIS — E083299 Diabetes mellitus due to underlying condition with mild nonproliferative diabetic retinopathy without macular edema, unspecified eye: Secondary | ICD-10-CM

## 2019-10-21 DIAGNOSIS — E1165 Type 2 diabetes mellitus with hyperglycemia: Secondary | ICD-10-CM

## 2019-10-21 NOTE — Patient Instructions (Addendum)
Visit Information  It was great to speak with you today Beth Maldonado! Please be sure to bring in your FreeStyle [place] sensors, scanner, and your blood pressure machine so we can review all of that next week. Please let me know if there is anything I can do to help you!  Goals Addressed            This Visit's Progress   . Chronic Care Management       CARE PLAN ENTRY  Current Barriers:  . Chronic Disease Management support, education, and care coordination needs related to type 2 diabetes, hypertension, hyperlipidemia, depression, chronic back pain, neuropathy.  Clinical Goal(s): Over the next 7 days, patient will:  . Work with the care management team to address educational, disease management, and care coordination needs  . Begin or continue self health monitoring activities as directed today  . Call provider office for new or worsened signs and symptoms  . Call care management team with questions or concerns . Maintain A1c < 8% . Achieve blood pressure <140/90  . Maintain LDL (bad cholesterol) <100  Interventions:  . Evaluation of current treatment plans and patient's adherence to plan as established by provider . Assessed patient understanding of disease states . Assessed patient's education and care coordination needs . Provided disease specific education to patient  . Use Freestyle Libre at least three times daily to check blood sugars (before breakfast, dinner, bedtime and any time you feel symptoms of low blood sugar) . Use blood pressure monitor to check blood pressure at least once weekly  . Limit coffee to no more than 6-7 cups each day.   Face to Face appointment with pharmacist scheduled for:  10/28/19 at 11:00 AM     . Increase physical activity   Not on track   . Increase water intake   Not on track   . Limit coffee to 6-7 cups daily      . Reduce portion size   Not on track      Beth Maldonado was given information about Chronic Care Management services today  including:  1. CCM service includes personalized support from designated clinical staff supervised by her physician, including individualized plan of care and coordination with other care providers 2. 24/7 contact phone numbers for assistance for urgent and routine care needs. 3. Standard insurance, coinsurance, copays and deductibles apply for chronic care management only during months in which we provide at least 20 minutes of these services. Most insurances cover these services at 100%, however patients may be responsible for any copay, coinsurance and/or deductible if applicable. This service may help you avoid the need for more expensive face-to-face services. 4. Only one practitioner may furnish and bill the service in a calendar month. 5. The patient may stop CCM services at any time (effective at the end of the month) by phone call to the office staff.  Patient agreed to services and verbal consent obtained.   The patient verbalized understanding of instructions provided today and agreed to receive a mailed copy of patient instruction and/or educational materials. Face to Face appointment with pharmacist scheduled for:  10/28/19 at 11:00 AM  Doristine Section [phone]  Hypoglycemia Hypoglycemia is when the sugar (glucose) level in your blood is too low. Signs of low blood sugar may include:  Feeling: ? Hungry. ? Worried or nervous (anxious). ? Sweaty and clammy. ? Confused. ? Dizzy. ? Sleepy. ? Sick to your stomach (nauseous).  Having: ? A fast heartbeat. ?  A headache. ? A change in your vision. ? Tingling or no feeling (numbness) around your mouth, lips, or tongue. ? Jerky movements that you cannot control (seizure).  Having trouble with: ? Moving (coordination). ? Sleeping. ? Passing out (fainting). ? Getting upset easily (irritability). Low blood sugar can happen to people who have diabetes and people who do not have diabetes. Low blood sugar can happen quickly,  and it can be an emergency. Treating low blood sugar Low blood sugar is often treated by eating or drinking something sugary right away, such as:  Fruit juice, 4-6 oz (120-150 mL).  Regular soda (not diet soda), 4-6 oz (120-150 mL).  Low-fat milk, 4 oz (120 mL).  Several pieces of hard candy.  Sugar or honey, 1 Tbsp (15 mL). Treating low blood sugar if you have diabetes If you can think clearly and swallow safely, follow the 15:15 rule:  Take 15 grams of a fast-acting carb (carbohydrate). Talk with your doctor about how much you should take.  Always keep a source of fast-acting carb with you, such as: ? Sugar tablets (glucose pills). Take 3-4 pills. ? 6-8 pieces of hard candy. ? 4-6 oz (120-150 mL) of fruit juice. ? 4-6 oz (120-150 mL) of regular (not diet) soda. ? 1 Tbsp (15 mL) honey or sugar.  Check your blood sugar 15 minutes after you take the carb.  If your blood sugar is still at or below 70 mg/dL (3.9 mmol/L), take 15 grams of a carb again.  If your blood sugar does not go above 70 mg/dL (3.9 mmol/L) after 3 tries, get help right away.  After your blood sugar goes back to normal, eat a meal or a snack within 1 hour.  Treating very low blood sugar If your blood sugar is at or below 54 mg/dL (3 mmol/L), you have very low blood sugar (severe hypoglycemia). This may also cause:  Passing out.  Jerky movements you cannot control (seizure).  Losing consciousness (coma). This is an emergency. Do not wait to see if the symptoms will go away. Get medical help right away. Call your local emergency services (911 in the [place] Do not drive yourself to the hospital. If you have very low blood sugar and you cannot eat or drink, you may need a glucagon shot (injection). A family member or friend should learn how to check your blood sugar and how to give you a glucagon shot. Ask your doctor if you need to have a glucagon shot kit at home. Follow these instructions at home: General  instructions  Take over-the-counter and prescription medicines only as told by your doctor.  Stay aware of your blood sugar as told by your doctor.  Limit alcohol intake to no more than 1 drink a day for nonpregnant women and 2 drinks a day for men. One drink equals 12 oz of beer (355 mL), 5 oz of wine (148 mL), or 1 oz of hard liquor (44 mL).  Keep all follow-up visits as told by your doctor. This is important. If you have diabetes:   Follow your diabetes care plan as told by your doctor. Make sure you: ? Know the signs of low blood sugar. ? Take your medicines as told. ? Follow your exercise and meal plan. ? Eat on time. Do not skip meals. ? Check your blood sugar as often as told by your doctor. Always check it before and after exercise. ? Follow your sick day plan when you cannot eat or drink  normally. Make this plan ahead of time with your doctor.  Share your diabetes care plan with: ? Your work or school. ? People you live with.  Check your pee (urine) for ketones: ? When you are sick. ? As told by your doctor.  Carry a card or wear jewelry that says you have diabetes. Contact a doctor if:  You have trouble keeping your blood sugar in your target range.  You have low blood sugar often. Get help right away if:  You still have symptoms after you eat or drink something sugary.  Your blood sugar is at or below 54 mg/dL (3 mmol/L).  You have jerky movements that you cannot control.  You pass out. These symptoms may be an emergency. Do not wait to see if the symptoms will go away. Get medical help right away. Call your local emergency services (911 in the [place] Do not drive yourself to the hospital. Summary  Hypoglycemia happens when the level of sugar (glucose) in your blood is too low.  Low blood sugar can happen to people who have diabetes and people who do not have diabetes. Low blood sugar can happen quickly, and it can be an emergency.  Make sure you know the  signs of low blood sugar and know how to treat it.  Always keep a source of sugar (fast-acting carb) with you to treat low blood sugar. This information is not intended to replace advice given to you by your health care provider. Make sure you discuss any questions you have with your health care provider. Document Revised: 11/05/2018 Document Reviewed: 08/18/2015 Elsevier Patient Education  2020 [employer].

## 2019-10-27 ENCOUNTER — Ambulatory Visit: Payer: MEDICARE | Attending: Internal Medicine

## 2019-10-27 DIAGNOSIS — Z23 Encounter for immunization: Secondary | ICD-10-CM

## 2019-10-27 NOTE — Progress Notes (Signed)
   Covid-19 Vaccination Clinic  Name:  Beth Maldonado    MRN: [id number] DOB: [birth year]  10/27/2019  [name] was observed post Covid-19 immunization for 15 minutes without incident. She was provided with Vaccine Information Sheet and instruction to access the V-Safe system.   [name] was instructed to call 911 with any severe reactions post vaccine: Marland Kitchen Difficulty breathing  . Swelling of face and throat  . A fast heartbeat  . A bad rash all over body  . Dizziness and weakness   Immunizations Administered    Name Date Dose VIS Date Route   Pfizer COVID-19 Vaccine 10/27/2019  1:31 PM 0.3 mL 07/09/2019 Intramuscular   Manufacturer: [employer], [employer]   Lot: [id number]   [place]: [id number]

## 2019-10-28 ENCOUNTER — Ambulatory Visit: Payer: MEDICARE

## 2019-11-04 ENCOUNTER — Ambulatory Visit: Payer: 59

## 2019-11-15 NOTE — Chronic Care Management (AMB) (Signed)
Chronic Care Management Pharmacy  Name: Beth Maldonado  MRN: [id number] DOB: [birth year]  Chief Complaint/ HPI  Beth Maldonado,  74 y.o. , female presents for their Follow-Up CCM visit with the clinical pharmacist In office.  PCP : Ronnald Nian, DO  Their chronic conditions include: type 2 Diabetes, hypertension, hyperlipidemia, depression, chronic back pain, neuropathy.  Office Visits: 04/14/19: TE with Dr. Bryan Lemma for DM follow-up. A1c improved from 11.9% to 7.8%. Patient working on diet exercise. No medication changes made.   Consult Visits: No recent consults noted.  Medications: Outpatient Encounter Medications as of 11/16/2019  Medication Sig  . albuterol (PROVENTIL HFA;VENTOLIN HFA) 108 (90 Base) MCG/ACT inhaler Inhale 2 puffs into the lungs every 6 (six) hours as needed for wheezing or shortness of breath. (Patient not taking: Reported on 10/21/2019)  . aspirin EC 81 MG tablet Take 1 tablet (81 mg total) by mouth daily.  Marland Kitchen atorvastatin (LIPITOR) 10 MG tablet Take 1 tablet (10 mg total) by mouth daily.  . Biotin 5000 MCG CAPS Take 5,000 mcg by mouth.  . escitalopram (LEXAPRO) 20 MG tablet Take 1 tablet (20 mg total) by mouth daily.  . ferrous sulfate (GNP IRON) 325 (65 FE) MG tablet Take 1 tablet (325 mg total) by mouth daily with breakfast.  . gabapentin (NEURONTIN) 300 MG capsule TAKE 2 CAPSULES BY MOUTH 3  TIMES DAILY (Patient taking differently: Take 600 mg by mouth 2 (two) times daily. )  . hydrOXYzine (ATARAX/VISTARIL) 25 MG tablet TAKE 1/2 TO 1 TABLET BY  MOUTH EVERY 8 HOURS AS  NEEDED FOR ITCHING  . Insulin Glargine, 1 Unit Dial, (TOUJEO SOLOSTAR) 300 UNIT/ML SOPN Inject 22 Units into the skin daily. Dx.E11.9  . insulin lispro (HUMALOG KWIKPEN) 100 UNIT/ML KwikPen Inject 7 units every morning and 7 units every evening.  . Insulin Pen Needle 32G X 4 MM MISC Use for insulin injection twice a day 250.00  . lisinopril (PRINIVIL,ZESTRIL) 5 MG tablet Take 1 tablet (5  mg total) by mouth 2 (two) times daily. (Patient not taking: Reported on 04/14/2019)  . metFORMIN (GLUCOPHAGE) 500 MG tablet TAKE 2 TABLETS BY MOUTH TWO TIMES DAILY  . Multiple Vitamin (MULTIVITAMIN) tablet Take 1 tablet by mouth daily.  Marland Kitchen triamterene-hydrochlorothiazide (MAXZIDE-25) 37.5-25 MG tablet Take 1 tablet by mouth daily.   No facility-administered encounter medications on file as of 11/16/2019.     Current Diagnosis/Assessment:  Goals Addressed            This Visit's Progress   . Chronic Care Management       CARE PLAN ENTRY  Current Barriers:  . Chronic Disease Management support, education, and care coordination needs related to type 2 diabetes, hypertension, hyperlipidemia, depression, chronic back pain, neuropathy.  Clinical Goal(s): Over the next 7 days, patient will:  . Work with the care management team to address educational, disease management, and care coordination needs  . Begin or continue self health monitoring activities as directed today  . Call provider office for new or worsened signs and symptoms  . Call care management team with questions or concerns . Maintain A1c < 8% . Achieve blood pressure <140/90  . Maintain LDL (bad cholesterol) <100  Interventions:  . Evaluation of current treatment plans and patient's adherence to plan as established by provider . Assessed patient understanding of disease states . Assessed patient's education and care coordination needs . Provided disease specific education to patient    Patient Self Care Activities:  .  Patient agrees to use Freestyle Libre at least three times daily to check blood sugars (before breakfast, dinner, bedtime and any time you feel symptoms of low blood sugar) . Patient agrees to use blood pressure monitor to check blood pressure at least once weekly . Patient agrees to limit coffee to no more than 6-7 cups each day.   Telephone follow up appointment with pharmacy team member scheduled  for: 11/30/19 at 3:00 PM       Diabetes   Recent Relevant Labs: Lab Results  Component Value Date/Time   HGBA1C 7.8 (H) 04/01/2019 08:25 AM   HGBA1C 11.9 (H) 10/23/2018 11:52 AM   HGBA1C 10 09/04/2018 01:25 PM   HGBA1C 6.6 (H) 04/03/2016 10:09 AM   EGFR 44 (L) 10/30/2016 08:29 AM   MICROALBUR <0.7 04/08/2019 11:08 AM   MICROALBUR 1.5 11/11/2018 08:58 AM     Checking BG: Never. Has FreeStyle [place], but was never taught how to use it. Recent FBG Readings: n/a Recent pre-meal BG readings: n/a Recent 2hr PP BG readings:  n/a Recent HS BG readings: n/a  Patient has failed these meds in past: Humalog 75/25 Patient is currently controlled on the following medications:   Metformin 1000 mg twice daily  Toujeo 24 units daily  Humalog 8 units twice daily  Aspirin 81 mg daily   A1c goal <8%  Last diabetic Foot exam:  Lab Results  Component Value Date/Time   HMDIABEYEEXA No Retinopathy 08/02/2019 12:00 AM    Last diabetic Eye exam: No results found for: HMDIABFOOTEX   We discussed: diet and exercise extensively. Patient reports eating more fast food recently, as she has been exhausted from dealing with the estate of her sister in law and has not had as much time to shop and cook for herself. Patient denies hypoglycemia. Previously symptoms of lows include :lightheaded, dizziness, pass out.  We spent a significant amount of time today reviewing the use of her [employer]. Set up patient's monitor and placed patient's first sensor on her. Counseled patient to check her freestyle libre at least three times daily.   Plan  Continue current medications    Hypertension   BP today is: <130/80  Office blood pressures are  BP Readings from Last 3 Encounters:  11/16/19 126/60  10/16/18 (!) 172/82  09/04/18 (!) 142/76   CMP Latest Ref Rng & Units 04/01/2019 10/16/2018 05/08/2018  Glucose 70 - 99 mg/dL 105(H) 253(H) 214(H)  BUN 6 - 23 mg/dL _0 Creatinine 0.40 - 1.20 mg/dL  1.47(H) 1.47(H) 1.49(H)  Sodium 135 - 145 mEq/L 139 139 140  Potassium 3.5 - 5.1 mEq/L 3.7 4.5 4.5  Chloride 96 - 112 mEq/L 103 103 103  CO2 19 - 32 mEq/L _1 Calcium 8.4 - 10.5 mg/dL 9.4 9.3 9.5  Total Protein 6.0 - 8.3 g/dL - - -  Total Bilirubin 0.2 - 1.2 mg/dL - - -  Alkaline Phos 39 - 117 U/L - - -  AST 0 - 37 U/L 21 - -  ALT 0 - 35 U/L 16 - -  eGFR      42.13   42.19   44.20 CrCl 32 mL/min based on Adj wt of 60kg.  Patient has failed these meds in the past: amlodipine, losartan, lisinopril  Patient is currently uncontrolled on the following medications: triamterene-HCTZ 37.5-25 mg daily  Patient checks BP at home infrequently  Patient home BP readings are ranging:   We discussed Patient drinks 7-10 cups  of coffee (black) day. Likes the taste. She reports she often wakes up in the middle of the night to drink coffee as well. She has been trying to drink more water but does not like the taste. We discussed the impact of caffeine on blood pressure, and patient agreed to try and reduce her coffee intake.     A significant portion of time today was spent educating patient on proper blood pressure monitoring technique. Patient brought in her home blood pressure machine, which I instructed patient how to use. Encouraged patient to purchase a new arm cuff, as hers appeared to small.   Plan  Continue current medications. Will re-assess appropriateness of restarting lisinopril after seeing BP results. Patient agreed to reduce coffee intake to 6-7 cups daily.     Hyperlipidemia   Lipid Panel     Component Value Date/Time   CHOL 132 04/01/2019 0825   TRIG 80.0 04/01/2019 0825   HDL 52.40 04/01/2019 0825   CHOLHDL 3 04/01/2019 0825   VLDL 16.0 04/01/2019 0825   LDLCALC 64 04/01/2019 0825   LDLDIRECT 109.0 09/20/2015 0750     The 10-year ASCVD risk score Mikey Bussing DC Jr., et al., 2013) is: 20.8%   Values used to calculate the score:     Age: 25 years     Sex: Female     Is  Non-Hispanic African American: Yes     Diabetic: Yes     Tobacco smoker: No     Systolic Blood Pressure: 161 mmHg     Is BP treated: Yes     HDL Cholesterol: 52.4 mg/dL     Total Cholesterol: 132 mg/dL   Patient has failed these meds in past: n/a Patient is currently controlled on the following medications: atorvastatin 10 mg daily LDL goal < 100   We discussed:  Patient tolerating atorvastatin well.  Plan  Continue current medications  Depression/Anxiety   Patient has failed these meds in past: paroxetine, venlafaxine Patient is currently controlled on the following medications: Escitalopram 20 mg daily,    We discussed: Patient has been under a lot of stressors lately. She reports she has lost multiple family members recently. She is grieving but states she feels she is doing ok. She is tolerating escitalopram and feels it has been helpful to manage her mood. She reports feeling exhausted as she has been helping deal with estates of her family members.  Plan  Continue current medications  Chronic Pain/Neuropathy   Patient has failed these meds in past: cyclobenzaprine, tizanidine, methocarbamol Patient is currently managed on the following medications: gabapentin 600 mg TID  We discussed:  Patient receives shots every 3 months for pain. Last shot on 10/15/19. Hydrocodone-APAP 5-325 mg took two pills, had no pain relief. Been cutting back on activity recently which has helped with her overall pain.   For her neuropathy, patient only takes gabapentin 600 mg twice daily. She denies symptoms of CNS depression or oversedation.   Plan  Given patients kidney function, maximum recommended gabapentin is 900 mg daily. Patient slightly over but appears to be tolerating well with no SEs.   Recommend Continuing current medications and continuing to monitor for signs of SEs.  Misc/OTC   Biotin 5000 mcg daily-hair, unsure if she sees any benefit.  Ferrous sulfate 325 mg  daily Multivitamin daily Proventil HFA 190mg/act 2 puff q6hr PRN Hydroxyzine 12.5-25 mg q8hr PRN itching- Uses daily for itching.   Plan  Continue current medications  Vaccines   Reviewed  and discussed patient's vaccination history.    Immunization History  Administered Date(s) Administered  . Influenza Split 05/21/2011, 06/18/2012  . Influenza Whole 05/12/2013  . Influenza, High Dose Seasonal PF 04/23/2016, 05/15/2017, 03/31/2018  . Influenza,inj,Quad PF,6+ Mos 03/21/2014  . Influenza-Unspecified 04/29/2015  . PFIZER SARS-COV-2 Vaccination 10/01/2019, 10/27/2019  . Pneumococcal Conjugate-13 03/21/2014  . Pneumococcal Polysaccharide-23 05/21/2011    Plan  Recommend patient receive Shingrix vaccine  Medication Management   Pt uses [facility] pharmacy for most medications. Dispensing report showed >5 day gap on fill of:  Atorvastatin (LF 09/01/19 for 90DS)  Triamterene-HCTZ (LF 09/01/19 for 90DS)  Metformin (10/14/19 for 90DS)  We discussed:  Importance of daily adherence to chronic medications  Follow up:  2 weeks  Doristine Section [phone]

## 2019-11-16 ENCOUNTER — Other Ambulatory Visit (INDEPENDENT_AMBULATORY_CARE_PROVIDER_SITE_OTHER): Payer: MEDICARE

## 2019-11-16 ENCOUNTER — Other Ambulatory Visit: Payer: Self-pay

## 2019-11-16 ENCOUNTER — Ambulatory Visit: Payer: MEDICARE

## 2019-11-16 VITALS — BP 126/60 | Ht 61.0 in | Wt 182.0 lb

## 2019-11-16 DIAGNOSIS — E083299 Diabetes mellitus due to underlying condition with mild nonproliferative diabetic retinopathy without macular edema, unspecified eye: Secondary | ICD-10-CM

## 2019-11-16 DIAGNOSIS — M5416 Radiculopathy, lumbar region: Secondary | ICD-10-CM | POA: Diagnosis not present

## 2019-11-16 DIAGNOSIS — I10 Essential (primary) hypertension: Secondary | ICD-10-CM

## 2019-11-16 DIAGNOSIS — E1165 Type 2 diabetes mellitus with hyperglycemia: Secondary | ICD-10-CM

## 2019-11-16 DIAGNOSIS — M48061 Spinal stenosis, lumbar region without neurogenic claudication: Secondary | ICD-10-CM | POA: Diagnosis not present

## 2019-11-16 NOTE — Patient Instructions (Addendum)
Visit Information It was great to see you today Ms. Penix. I included some information on how to upload your FreeStyle Elenor Legato so that in two weeks we can look and see how your blood sugars have been doing.   Goals Addressed            This Visit's Progress   . Chronic Care Management       CARE PLAN ENTRY  Current Barriers:  . Chronic Disease Management support, education, and care coordination needs related to type 2 diabetes, hypertension, hyperlipidemia, depression, chronic back pain, neuropathy.  Clinical Goal(s): Over the next 7 days, patient will:  . Work with the care management team to address educational, disease management, and care coordination needs  . Begin or continue self health monitoring activities as directed today  . Call provider office for new or worsened signs and symptoms  . Call care management team with questions or concerns . Maintain A1c < 8% . Achieve blood pressure <140/90  . Maintain LDL (bad cholesterol) <100  Interventions:  . Evaluation of current treatment plans and patient's adherence to plan as established by provider . Assessed patient understanding of disease states . Assessed patient's education and care coordination needs . Provided disease specific education to patient    Patient Self Care Activities:  . Patient agrees to use Freestyle Libre at least three times daily to check blood sugars (before breakfast, dinner, bedtime and any time you feel symptoms of low blood sugar) . Patient agrees to use blood pressure monitor to check blood pressure at least once weekly . Patient agrees to limit coffee to no more than 6-7 cups each day.   Telephone follow up appointment with pharmacy team member scheduled for: 11/30/19 at 3:00 PM       The patient verbalized understanding of instructions provided today and agreed to receive a mailed copy of patient instruction and/or educational materials.  Telephone follow up appointment with pharmacy team  member scheduled for: 11/30/19  Doristine Section [phone]

## 2019-11-17 LAB — BASIC METABOLIC PANEL
BUN: 21 mg/dL (ref 6–23)
CO2: 27 mEq/L (ref 19–32)
Calcium: 8.9 mg/dL (ref 8.4–10.5)
Chloride: 104 mEq/L (ref 96–112)
Creatinine, Ser: 1.31 mg/dL — ABNORMAL HIGH (ref 0.40–1.20)
GFR: 48.04 mL/min — ABNORMAL LOW (ref >60.00)
Glucose, Bld: 83 mg/dL (ref 70–99)
Potassium: 4 mEq/L (ref 3.5–5.1)
Sodium: 138 mEq/L (ref 135–145)

## 2019-11-17 LAB — HEMOGLOBIN A1C: Hgb A1c MFr Bld: 7.1 % — ABNORMAL HIGH (ref 4.6–6.5)

## 2019-11-30 ENCOUNTER — Ambulatory Visit: Payer: MEDICARE

## 2019-11-30 DIAGNOSIS — Z794 Long term (current) use of insulin: Secondary | ICD-10-CM

## 2019-11-30 DIAGNOSIS — I10 Essential (primary) hypertension: Secondary | ICD-10-CM

## 2019-11-30 NOTE — Chronic Care Management (AMB) (Signed)
Chronic Care Management Pharmacy  Name: Beth Maldonado  MRN: [id number] DOB: 03-Aug-1945  Chief Complaint/ HPI  Beth Maldonado,  74 y.o. , female presents for their Follow-Up CCM visit with the clinical pharmacist In office.  PCP : Ronnald Nian, DO  Their chronic conditions include: type 2 Diabetes, hypertension, hyperlipidemia, depression, chronic back pain, neuropathy.  Office Visits: 04/14/19: TE with Dr. Bryan Lemma for DM follow-up. A1c improved from 11.9% to 7.8%. Patient working on diet exercise. No medication changes made.   Consult Visits: No recent consults noted.  Medications: Outpatient Encounter Medications as of 11/30/2019  Medication Sig  . albuterol (PROVENTIL HFA;VENTOLIN HFA) 108 (90 Base) MCG/ACT inhaler Inhale 2 puffs into the lungs every 6 (six) hours as needed for wheezing or shortness of breath. (Patient not taking: Reported on 10/21/2019)  . aspirin EC 81 MG tablet Take 1 tablet (81 mg total) by mouth daily.  Marland Kitchen atorvastatin (LIPITOR) 10 MG tablet Take 1 tablet (10 mg total) by mouth daily.  . Biotin 5000 MCG CAPS Take 5,000 mcg by mouth.  . escitalopram (LEXAPRO) 20 MG tablet Take 1 tablet (20 mg total) by mouth daily.  . ferrous sulfate (GNP IRON) 325 (65 FE) MG tablet Take 1 tablet (325 mg total) by mouth daily with breakfast.  . gabapentin (NEURONTIN) 300 MG capsule TAKE 2 CAPSULES BY MOUTH 3  TIMES DAILY (Patient taking differently: Take 600 mg by mouth 2 (two) times daily. )  . hydrOXYzine (ATARAX/VISTARIL) 25 MG tablet TAKE 1/2 TO 1 TABLET BY  MOUTH EVERY 8 HOURS AS  NEEDED FOR ITCHING  . Insulin Glargine, 1 Unit Dial, (TOUJEO SOLOSTAR) 300 UNIT/ML SOPN Inject 22 Units into the skin daily. Dx.E11.9  . insulin lispro (HUMALOG KWIKPEN) 100 UNIT/ML KwikPen Inject 7 units every morning and 7 units every evening.  . Insulin Pen Needle 32G X 4 MM MISC Use for insulin injection twice a day 250.00  . lisinopril (PRINIVIL,ZESTRIL) 5 MG tablet Take 1 tablet (5 mg  total) by mouth 2 (two) times daily. (Patient not taking: Reported on 04/14/2019)  . metFORMIN (GLUCOPHAGE) 500 MG tablet TAKE 2 TABLETS BY MOUTH TWO TIMES DAILY  . Multiple Vitamin (MULTIVITAMIN) tablet Take 1 tablet by mouth daily.  Marland Kitchen triamterene-hydrochlorothiazide (MAXZIDE-25) 37.5-25 MG tablet Take 1 tablet by mouth daily.   No facility-administered encounter medications on file as of 11/30/2019.     Current Diagnosis/Assessment:  Goals Addressed            This Visit's Progress   . Chronic Care Management   On track    CARE PLAN ENTRY  Current Barriers:  . Chronic Disease Management support, education, and care coordination needs related to type 2 diabetes, hypertension, hyperlipidemia, depression, chronic back pain, neuropathy.  Clinical Goal(s): Over the next 30 days, patient will:  . Work with the care management team to address educational, disease management, and care coordination needs  . Begin or continue self health monitoring activities as directed today  . Call provider office for new or worsened signs and symptoms  . Call care management team with questions or concerns . Maintain A1c less than 8% . Maintain blood pressure  less than 140/90  . Maintain LDL (bad cholesterol) less than 100  Interventions:  . Evaluation of current treatment plans and patient's adherence to plan as established by provider . Assessed patient understanding of disease states . Assessed patient's education and care coordination needs . Provided disease specific education to patient  Patient Self Care Activities:  . Patient agrees to check blood sugars twice daily, prior to breakfast and prior to dinner.  . Patient agrees to use blood pressure monitor to check blood pressure daily in the morning and record the results  Telephone follow up appointment with pharmacy team member scheduled for: 01/06/20 at 10:00 AM    . Increase physical activity   Not on track   . Increase water intake    On track   . Limit coffee to 6-7 cups daily   Not on track   . Reduce portion size   Not on track      Diabetes   Recent Relevant Labs: Lab Results  Component Value Date/Time   HGBA1C 7.1 (H) 11/16/2019 02:03 PM   HGBA1C 7.8 (H) 04/01/2019 08:25 AM   HGBA1C 10 09/04/2018 01:25 PM   HGBA1C 6.6 (H) 04/03/2016 10:09 AM   EGFR 44 (L) 10/30/2016 08:29 AM   MICROALBUR <0.7 04/08/2019 11:08 AM   MICROALBUR 1.5 11/11/2018 08:58 AM     Checking BG: 2x per Day. FreeStyle Libre sensor was placed on 11/16/19.  Recent FBG Readings: n/a Recent pre-meal BG readings: n/a Recent 2hr PP BG readings:  n/a Recent HS BG readings: n/a  Patient has failed these meds in past: Humalog 75/25 Patient is currently controlled on the following medications:   Metformin 1000 mg twice daily  Toujeo 24 units daily  Humalog 8 units twice daily  Aspirin 81 mg daily   A1c goal <8%  Last diabetic Foot exam:  Lab Results  Component Value Date/Time   HMDIABEYEEXA No Retinopathy 08/02/2019 12:00 AM    Last diabetic Eye exam: No results found for: HMDIABFOOTEX   We discussed: diet and exercise extensively. Patient reports checking her sensor twice daily, but that it fell off after a week. Reported blood sugars ranged from 49-116, but patient's sensors expired in 2019. Patient no longer interested in using FreeStyle [place] sensors and would rather resume traditional monitoring methods. Patient denies hypoglycemia. Previously symptoms of lows include: lightheaded, dizziness, passed out.   Patient reports eating more fast food recently, as she has been exhausted from dealing with the estate of her sister in law and has not had as much time to shop and cook for herself. Patient typically eats to main meals daily (breakfast and dinner).  Plan  Continue current medications  Recommend discontinuing FreeStyle Libre Sensors  Recommend starting [employer] test strips and lancets to monitor blood sugar twice daily.     Hypertension   BP today is: n/a  Office blood pressures are  BP Readings from Last 3 Encounters:  11/16/19 126/60  10/16/18 (!) 172/82  09/04/18 (!) 142/76   CMP Latest Ref Rng & Units 11/16/2019 04/01/2019 10/16/2018  Glucose 70 - 99 mg/dL 83 105(H) 253(H)  BUN 6 - 23 mg/dL '21 18 15  '$ Creatinine 0.40 - 1.20 mg/dL 1.31(H) 1.47(H) 1.47(H)  Sodium 135 - 145 mEq/L 138 139 139  Potassium 3.5 - 5.1 mEq/L 4.0 3.7 4.5  Chloride 96 - 112 mEq/L 104 103 103  CO2 19 - 32 mEq/L '27 28 28  '$ Calcium 8.4 - 10.5 mg/dL 8.9 9.4 9.3  Total Protein 6.0 - 8.3 g/dL - - -  Total Bilirubin 0.2 - 1.2 mg/dL - - -  Alkaline Phos 39 - 117 U/L - - -  AST 0 - 37 U/L - 21 -  ALT 0 - 35 U/L - 16 -  CrCl 37 mL/min based on Cr  of 1.31 and Adj wt of 62kg.  Patient has failed these meds in the past: amlodipine, losartan, lisinopril Patient is currently controlled on the following medications: triamterene-HCTZ 37.5-25 mg daily  BP goal <140/90  Patient checks BP at home never  Patient home BP readings are ranging: n/a  We discussed Patient drinks 7-10 cups of coffee (black) day. Likes the taste. She reports she often starts drinking at [facility] and drinks frequently throughout the day. She has been drinking more alkaline water but does not like the taste. We discussed the impact of caffeine on blood pressure, kidney function, and fatigue.   Plan  Continue current medications.    Hyperlipidemia   Lipid Panel     Component Value Date/Time   CHOL 132 04/01/2019 0825   TRIG 80.0 04/01/2019 0825   HDL 52.40 04/01/2019 0825   CHOLHDL 3 04/01/2019 0825   VLDL 16.0 04/01/2019 0825   LDLCALC 64 04/01/2019 0825   LDLDIRECT 109.0 09/20/2015 0750     The 10-year ASCVD risk score Mikey Bussing DC Jr., et al., 2013) is: 20.8%   Values used to calculate the score:     Age: 22 years     Sex: Female     Is Non-Hispanic African American: Yes     Diabetic: Yes     Tobacco smoker: No     Systolic Blood Pressure: 707 mmHg      Is BP treated: Yes     HDL Cholesterol: 52.4 mg/dL     Total Cholesterol: 132 mg/dL   Patient has failed these meds in past: n/a Patient is currently controlled on the following medications: atorvastatin 10 mg daily LDL goal < 100   We discussed:  Patient tolerating atorvastatin well.  Plan  Continue current medications  Depression/Anxiety   Patient has failed these meds in past: paroxetine, venlafaxine Patient is currently controlled on the following medications:   Escitalopram 20 mg daily  Duloxetine 30 mg filled on 11/16/19  We discussed: Patient has been under a lot of stressors lately. She reports she has lost multiple family members recently. She reports feeling exhausted as she has been helping deal with estates of her family members.   Plan  Continue current medications  Chronic Pain/Neuropathy   Patient has failed these meds in past: cyclobenzaprine, tizanidine, methocarbamol Patient is currently managed on the following medications: gabapentin 600 mg TID  We discussed:  Patient receives shots every 3 months for pain. Last shot on 10/15/19. Hydrocodone-APAP 5-325 mg took two pills, had no pain relief. Been cutting back on activity recently which has helped with her overall pain.   For her neuropathy, patient only takes gabapentin 600 mg twice daily. She denies symptoms of CNS depression or oversedation.   Plan  Given patients kidney function, maximum recommended gabapentin is 900 mg daily. Patient slightly over but appears to be tolerating well with no SEs.   Recommend Continuing current medications and continuing to monitor for signs of SEs.  Misc/OTC   Biotin 5000 mcg daily-hair, unsure if she sees any benefit.  Ferrous sulfate 325 mg daily Multivitamin daily Proventil HFA 134mg/act 2 puff q6hr PRN Hydroxyzine 12.5-25 mg q8hr PRN itching- Uses daily for itching.   Plan  Continue current medications  Vaccines   Reviewed and discussed patient's  vaccination history.    Immunization History  Administered Date(s) Administered  . Influenza Split 05/21/2011, 06/18/2012  . Influenza Whole 05/12/2013  . Influenza, High Dose Seasonal PF 04/23/2016, 05/15/2017, 03/31/2018  . Influenza,inj,Quad PF,6+  Mos 03/21/2014  . Influenza-Unspecified 04/29/2015  . PFIZER SARS-COV-2 Vaccination 10/01/2019, 10/27/2019  . Pneumococcal Conjugate-13 03/21/2014  . Pneumococcal Polysaccharide-23 05/21/2011    Plan  Recommend patient receive Shingrix vaccine  Medication Management   Pt uses [facility] pharmacy for most medications. Dispensing report showed >5 day gap on fill of:  Atorvastatin (LF 09/01/19 for 90DS)  Triamterene-HCTZ (LF 09/01/19 for 90DS)  Metformin (10/14/19 for 90DS)  We discussed:  Importance of daily adherence to chronic medications  Follow up:  1 month  Doristine Section [phone]

## 2019-11-30 NOTE — Patient Instructions (Signed)
Visit Information  Goals Addressed            This Visit's Progress   . Chronic Care Management   On track    CARE PLAN ENTRY  Current Barriers:  . Chronic Disease Management support, education, and care coordination needs related to type 2 diabetes, hypertension, hyperlipidemia, depression, chronic back pain, neuropathy.  Clinical Goal(s): Over the next 30 days, patient will:  . Work with the care management team to address educational, disease management, and care coordination needs  . Begin or continue self health monitoring activities as directed today  . Call provider office for new or worsened signs and symptoms  . Call care management team with questions or concerns . Maintain A1c less than 8% . Maintain blood pressure  less than 140/90  . Maintain LDL (bad cholesterol) less than 100  Interventions:  . Evaluation of current treatment plans and patient's adherence to plan as established by provider . Assessed patient understanding of disease states . Assessed patient's education and care coordination needs . Provided disease specific education to patient   Patient Self Care Activities:  . Patient agrees to check blood sugars twice daily, prior to breakfast and prior to dinner.  . Patient agrees to use blood pressure monitor to check blood pressure daily in the morning and record the results  Telephone follow up appointment with pharmacy team member scheduled for: 01/06/20 at 10:00 AM    . Increase physical activity   Not on track   . Increase water intake   On track   . Limit coffee to 6-7 cups daily   Not on track   . Reduce portion size   Not on track      The patient verbalized understanding of instructions provided today and agreed to receive a mailed copy of patient instruction and/or educational materials.  [place] [facility]  [phone]

## 2019-12-02 ENCOUNTER — Telehealth (INDEPENDENT_AMBULATORY_CARE_PROVIDER_SITE_OTHER): Payer: MEDICARE | Admitting: Family Medicine

## 2019-12-02 ENCOUNTER — Encounter: Payer: Self-pay | Admitting: Family Medicine

## 2019-12-02 VITALS — Ht 61.0 in | Wt 182.0 lb

## 2019-12-02 DIAGNOSIS — G4700 Insomnia, unspecified: Secondary | ICD-10-CM

## 2019-12-02 DIAGNOSIS — F418 Other specified anxiety disorders: Secondary | ICD-10-CM

## 2019-12-02 MED ORDER — DULOXETINE HCL 60 MG PO CPEP: 60.0000 mg | ORAL_CAPSULE | Freq: Every day | ORAL | 1 refills | Status: DC

## 2019-12-02 NOTE — Progress Notes (Signed)
Virtual Visit via Video Note  Interactive audio and video telecommunications were attempted between myself and the patient, however failed, due to the patient having technical difficulties. We continued and completed the visit with audio only.  I connected with Beth Maldonado on 12/02/19 at 11:00 AM EDT by a video enabled telemedicine application and verified that I am speaking with the correct person using two identifiers. Location patient: home Location provider: work  Persons participating in the virtual visit: patient, provider  I discussed the limitations of evaluation and management by telemedicine and the availability of in person appointments. The patient expressed understanding and agreed to proceed.  Chief Complaint  Patient presents with  . Fatigue    Pt c/o feeling like she is always tired worsening for a year.    HPI: Beth Maldonado is a 74 y.o. female who has a h/o insomnia and notes more trouble with sleep in the past year. She has issues falling and staying asleep. Pt states she is "tired" and "can't do anything".   She notes being someone who would sleep 4-6hrs a night, now not getting that.  She has tried melatonin in the past without improvement. She was recently started on cymbalta 30mg  nightly but her "back doctor". She does not feel it is effective.  She has a lot of stress due to settling SIL's estate x 2-2.5 years. She laughs at the idea of counseling and declines referral.   Depression screen [facility] 2/9 12/02/2019 04/14/2019 09/16/2017  Decreased Interest 1 0 0  Down, Depressed, Hopeless 1 0 0  PHQ - 2 Score 2 0 0  Altered sleeping 3 - -  Tired, decreased energy 3 - -  Change in appetite 3 - -  Feeling bad or failure about yourself  0 - -  Trouble concentrating 0 - -  Moving slowly or fidgety/restless 0 - -  Suicidal thoughts 0 - -  PHQ-9 Score 11 - -  Difficult doing work/chores Somewhat difficult - -  Some recent data might be hidden    Past Medical  History:  Diagnosis Date  . Acute bronchitis 03/29/2013  . Acute upper respiratory infection 11/27/2014  . Allergy   . Anemia 07/02/2015  . Arthritis   . Back pain    upper and lower last 20 years  . Cancer of left colon ([place]) 12/18/2015  . Depression with anxiety 06/27/2013  . Diabetes mellitus   . Hx of adenomatous colonic polyps 10/27/2015  . Hyperlipidemia   . Hypertension   . Insomnia 06/30/2015  . Medicare annual wellness visit, subsequent 03/23/2014  . Neck pain on left side 05/29/2014   hx of  . Neuromuscular disorder (HCC)    neuropathy feet  . Neuropathy   . Osteopenia   . Preventative health care 07/02/2015  . Tinnitus    RIGHT EAR ALL THE TIME    Past Surgical History:  Procedure Laterality Date  . BREAST ENHANCEMENT SURGERY Bilateral 1987   Reduction  . CATARACT EXTRACTION, BILATERAL  2011  . COLONOSCOPY  2017   colon cancer  . [place]  . KNEE ARTHROSCOPY Right 2003  . LAPAROSCOPIC SIGMOID COLECTOMY N/A 12/18/2015   Procedure: LAPAROSCOPIC SIGMOID COLECTOMY;  Surgeon: Alphonsa Overall, MD;  Location: [facility];  Service: General;  Laterality: N/A;  . TUBAL LIGATION  1970  . VAGINAL HYSTERECTOMY  1974   partial    Family History  Problem Relation Age of Onset  . Diabetes Mother   . Heart  attack Father   . Heart disease Father        13 and 34 MI  . Diabetes Sister   . Sickle cell anemia Sister   . Arthritis Sister   . Hypertension Sister   . Diabetes Sister   . Thyroid disease Sister   . Arthritis Sister   . Glaucoma Sister   . Esophageal cancer Neg Hx   . Stomach cancer Neg Hx   . Colon cancer Neg Hx     Social History   Tobacco Use  . Smoking status: Former Smoker    Packs/day: 1.00    Years: 20.00    Pack years: 20.00    Types: Cigarettes    Start date: 10/03/1966    Quit date: 11/03/1986    Years since quitting: 33.1  . Smokeless tobacco: Never Used  . Tobacco comment: quit 28 years ago  Substance Use Topics  . Alcohol use: Yes     Alcohol/week: 0.0 standard drinks    Comment: Rare- Special Occasional   . Drug use: No     Current Outpatient Medications:  .  albuterol (PROVENTIL HFA;VENTOLIN HFA) 108 (90 Base) MCG/ACT inhaler, Inhale 2 puffs into the lungs every 6 (six) hours as needed for wheezing or shortness of breath., Disp: 1 Inhaler, Rfl: 0 .  aspirin EC 81 MG tablet, Take 1 tablet (81 mg total) by mouth daily., Disp: 90 tablet, Rfl: 0 .  atorvastatin (LIPITOR) 10 MG tablet, Take 1 tablet (10 mg total) by mouth daily., Disp: 90 tablet, Rfl: 3 .  Biotin 5000 MCG CAPS, Take 5,000 mcg by mouth., Disp: , Rfl:  .  DULoxetine (CYMBALTA) 30 MG capsule, Take 30 mg by mouth daily., Disp: , Rfl:  .  escitalopram (LEXAPRO) 20 MG tablet, Take 1 tablet (20 mg total) by mouth daily., Disp: 90 tablet, Rfl: 1 .  ferrous sulfate (GNP IRON) 325 (65 FE) MG tablet, Take 1 tablet (325 mg total) by mouth daily with breakfast., Disp: 90 tablet, Rfl: 3 .  gabapentin (NEURONTIN) 300 MG capsule, TAKE 2 CAPSULES BY MOUTH 3  TIMES DAILY (Patient taking differently: Take 600 mg by mouth 2 (two) times daily. ), Disp: 540 capsule, Rfl: 3 .  HYDROcodone-acetaminophen (NORCO/VICODIN) 5-325 MG tablet, , Disp: , Rfl:  .  hydrOXYzine (ATARAX/VISTARIL) 25 MG tablet, TAKE 1/2 TO 1 TABLET BY  MOUTH EVERY 8 HOURS AS  NEEDED FOR ITCHING, Disp: 90 tablet, Rfl: 3 .  Insulin Glargine, 1 Unit Dial, (TOUJEO SOLOSTAR) 300 UNIT/ML SOPN, Inject 22 Units into the skin daily. Dx.E11.9, Disp: 9 mL, Rfl: 3 .  insulin lispro (HUMALOG KWIKPEN) 100 UNIT/ML KwikPen, Inject 7 units every morning and 7 units every evening., Disp: 15 mL, Rfl: 3 .  Insulin Pen Needle 32G X 4 MM MISC, Use for insulin injection twice a day 250.00, Disp: 100 each, Rfl: 2 .  metFORMIN (GLUCOPHAGE) 500 MG tablet, TAKE 2 TABLETS BY MOUTH TWO TIMES DAILY, Disp: 360 tablet, Rfl: 3 .  Multiple Vitamin (MULTIVITAMIN) tablet, Take 1 tablet by mouth daily., Disp: , Rfl:  .   triamterene-hydrochlorothiazide (MAXZIDE-25) 37.5-25 MG tablet, Take 1 tablet by mouth daily., Disp: 90 tablet, Rfl: 3 .  lisinopril (PRINIVIL,ZESTRIL) 5 MG tablet, Take 1 tablet (5 mg total) by mouth 2 (two) times daily. (Patient not taking: Reported on 04/14/2019), Disp: 180 tablet, Rfl: 1  Allergies  Allergen Reactions  . Penicillins Swelling and Other (See Comments)    tongue swelled per pt Has patient had  a PCN reaction causing immediate rash, facial/tongue/throat swelling, SOB or lightheadedness with hypotension: yes Has patient had a PCN reaction causing severe rash involving mucus membranes or skin necrosis: no Has patient had a PCN reaction that required hospitalization no  Has patient had a PCN reaction occurring within the last 10 years: unsure If all of the above answers are "NO", then may proceed with Cephalosporin use.       ROS: See pertinent positives and negatives per HPI.   EXAM:  VITALS per patient if applicable: Ht 5\' 1"  (1.549 m)   Wt 182 lb (82.6 kg)   BMI 34.39 kg/m   Wt Readings from Last 3 Encounters:  12/02/19 182 lb (82.6 kg)  11/16/19 182 lb (82.6 kg)  10/16/18 174 lb 3.2 oz (79 kg)     GENERAL: alert, oriented, and in no acute distress  LUNGS: no obvious SOB, gasping or wheezing, no conversational dyspnea  PSYCH/NEURO: pleasant and cooperative, speech and thought processing grossly intact   ASSESSMENT AND PLAN:  1. Insomnia, unspecified type Increase: - DULoxetine (CYMBALTA) 60 MG capsule; Take 1 capsule (60 mg total) by mouth daily.  Dispense: 90 capsule; Refill: 1 - increase from 30mg  to 60mg  - pt has tried melatonin without improvement - could consider trazodone, rozarem, remeron - pt will f/u in 2 wks if no/minimal improvement   2. Depression with anxiety Increase: - DULoxetine (CYMBALTA) 60 MG capsule; Take 1 capsule (60 mg total) by mouth daily.  Dispense: 90 capsule; Refill: 1 - increase from 30mg  to 60mg  daily - stop lexapro -  can decrease to 10mg  x 4-5 days then stop    I discussed the assessment and treatment plan with the patient. The patient was provided an opportunity to ask questions and all were answered. The patient agreed with the plan and demonstrated an understanding of the instructions.   The patient was advised to call back or seek an in-person evaluation if the symptoms worsen or if the condition fails to improve as anticipated.  I spent a total of 21 min on the telephone appt with this patient.  Letta Median, DO

## 2019-12-07 ENCOUNTER — Telehealth: Payer: MEDICARE

## 2019-12-12 ENCOUNTER — Other Ambulatory Visit: Payer: Self-pay | Admitting: Family Medicine

## 2019-12-12 DIAGNOSIS — Z794 Long term (current) use of insulin: Secondary | ICD-10-CM

## 2019-12-12 DIAGNOSIS — E118 Type 2 diabetes mellitus with unspecified complications: Secondary | ICD-10-CM

## 2019-12-12 MED ORDER — FREESTYLE LANCETS MISC: 12 refills | Status: DC

## 2019-12-12 MED ORDER — FREESTYLE TEST VI STRP: ORAL_STRIP | 12 refills | Status: DC

## 2019-12-14 ENCOUNTER — Other Ambulatory Visit: Payer: Self-pay | Admitting: Family Medicine

## 2020-01-06 ENCOUNTER — Ambulatory Visit: Payer: MEDICARE

## 2020-01-06 ENCOUNTER — Other Ambulatory Visit: Payer: Self-pay | Admitting: Family Medicine

## 2020-01-06 DIAGNOSIS — Z794 Long term (current) use of insulin: Secondary | ICD-10-CM

## 2020-01-06 DIAGNOSIS — F418 Other specified anxiety disorders: Secondary | ICD-10-CM

## 2020-01-06 NOTE — Chronic Care Management (AMB) (Signed)
 Chronic Care Management Pharmacy  Name: Beth Maldonado  MRN: [id number] DOB: 06/30/1946  Chief Complaint/ HPI  Beth Maldonado,  73 y.o. , female presents for their Follow-Up CCM visit with the clinical pharmacist In office.  PCP : Cirigliano, Mary K, DO  Their chronic conditions include: type 2 Diabetes, hypertension, hyperlipidemia, depression, chronic back pain, neuropathy.  Office Visits: 12/02/19: Video visit with Dr. Cirigliano for Insomnia/depression. PHQ-9 score 11. Patient getting less than 4-6 hours sleep nightly. Cymbalta increased to 60 mg daily with instructions to stop escitalopram 04/14/19: TE with Dr. Cirigliano for DM follow-up. A1c improved from 11.9% to 7.8%. Patient working on diet exercise. No medication changes made.   Consult Visits: No recent consults noted.  Medications: Outpatient Encounter Medications as of 01/06/2020  Medication Sig  . albuterol (PROVENTIL HFA;VENTOLIN HFA) 108 (90 Base) MCG/ACT inhaler Inhale 2 puffs into the lungs every 6 (six) hours as needed for wheezing or shortness of breath.  . aspirin EC 81 MG tablet Take 1 tablet (81 mg total) by mouth daily.  . atorvastatin (LIPITOR) 10 MG tablet Take 1 tablet (10 mg total) by mouth daily.  . Biotin 5000 MCG CAPS Take 5,000 mcg by mouth.  . DULoxetine (CYMBALTA) 60 MG capsule Take 1 capsule (60 mg total) by mouth daily.  . escitalopram (LEXAPRO) 20 MG tablet TAKE 1 TABLET BY MOUTH  DAILY  . ferrous sulfate (GNP IRON) 325 (65 FE) MG tablet Take 1 tablet (325 mg total) by mouth daily with breakfast.  . gabapentin (NEURONTIN) 300 MG capsule TAKE 2 CAPSULES BY MOUTH 3  TIMES DAILY (Patient taking differently: Take 600 mg by mouth 2 (two) times daily. )  . glucose blood (FREESTYLE TEST STRIPS) test strip Use as instructed  . HYDROcodone-acetaminophen (NORCO/VICODIN) 5-325 MG tablet   . hydrOXYzine (ATARAX/VISTARIL) 25 MG tablet TAKE 1/2 TO 1 TABLET BY  MOUTH EVERY 8 HOURS AS  NEEDED FOR ITCHING  .  Insulin Glargine, 1 Unit Dial, (TOUJEO SOLOSTAR) 300 UNIT/ML SOPN Inject 22 Units into the skin daily. Dx.E11.9  . insulin lispro (HUMALOG KWIKPEN) 100 UNIT/ML KwikPen Inject 7 units every morning and 7 units every evening.  . Insulin Pen Needle 32G X 4 MM MISC Use for insulin injection twice a day 250.00  . Lancets (FREESTYLE) lancets Use as instructed  . metFORMIN (GLUCOPHAGE) 500 MG tablet TAKE 2 TABLETS BY MOUTH TWO TIMES DAILY  . Multiple Vitamin (MULTIVITAMIN) tablet Take 1 tablet by mouth daily.  . triamterene-hydrochlorothiazide (MAXZIDE-25) 37.5-25 MG tablet Take 1 tablet by mouth daily.   No facility-administered encounter medications on file as of 01/06/2020.   Current Diagnosis/Assessment:  Goals Addressed            This Visit's Progress   . Chronic Care Management       CARE PLAN ENTRY  Current Barriers:  . Chronic Disease Management support, education, and care coordination needs related to Hypertension, Hyperlipidemia, Diabetes, Depression, and Chronic Pain   Hypertension . Pharmacist Clinical Goal(s): o Over the next 90 days, patient will work with PharmD and providers to achieve BP goal <140/90 . Current regimen:  o triamterene-HCTZ 37.5-25 mg daily . Interventions: o Increase activity level slowly  aiming for 150 minutes of moderate intensity (enough to get your heart rate up slightly) activity every week  . Patient self care activities - Over the next 90 days, patient will: o Check blood pressure weekly, document, and provide at future appointments o Ensure daily salt intake <   2300 mg/day  Hyperlipidemia . Pharmacist Clinical Goal(s): o Over the next 90 days, patient will work with PharmD and providers to maintain LDL goal < 100 . Current regimen:  o Atorvastatin 10 mg  Diabetes . Pharmacist Clinical Goal(s): o Over the next 90 days, patient will work with PharmD and providers to maintain A1c goal  <7.5% . Current regimen:  o Metformin 1000 mg twice  daily o Toujeo 24 units daily o Humalog 8 units twice daily Interventions: o Pharmacist to begin working on Patient Assistance Application for Toujeo o Patient will contact [facility] to see about getting testing supplies delivered . Patient self care activities - Over the next 90 days, patient will: o Check blood sugar twice daily (before breakfast and before dinner), document, and provide at future appointments o Contact provider with any episodes of hypoglycemia o Obtain a copy of your proof of income. You can use tax returns, social security statement.  Medication management . Pharmacist Clinical Goal(s): o Over the next 90 days, patient will work with PharmD and providers to maintain optimal medication adherence . Current pharmacy: [facility] . Interventions o Comprehensive medication review performed. o Continue current medication management strategy o For the next 4 days, take Escitalopram 10 mg (1/2 tablet) daily, then stop altogether . Patient self care activities - Over the next 90 days, patient will: o Take medications as prescribed o Report any questions or concerns to PharmD and/or provider(s)       Diabetes   Recent Relevant Labs: Lab Results  Component Value Date/Time   HGBA1C 7.1 (H) 11/16/2019 02:03 PM   HGBA1C 7.8 (H) 04/01/2019 08:25 AM   HGBA1C 10 09/04/2018 01:25 PM   HGBA1C 6.6 (H) 04/03/2016 10:09 AM   EGFR 44 (L) 10/30/2016 08:29 AM   MICROALBUR <0.7 04/08/2019 11:08 AM   MICROALBUR 1.5 11/11/2018 08:58 AM     Checking BG: Never.  Recent FBG Readings: n/a Recent pre-meal BG readings: n/a Recent 2hr PP BG readings:  n/a Recent HS BG readings: n/a  Patient has failed these meds in past: Humalog 75/25 Patient is currently controlled on the following medications:   Metformin 1000 mg twice daily  Toujeo 24 units daily (currently out of Toujeo  For 4-5 days) - 0.29 u/kg  Humalog 8 units twice daily- 0.19 u/kg  Aspirin 81 mg daily   A1c goal  <7.5%  Last diabetic Foot exam:  Lab Results  Component Value Date/Time   HMDIABEYEEXA No Retinopathy 08/02/2019 12:00 AM    Last diabetic Eye exam: No results found for: HMDIABFOOTEX   We discussed: diet and exercise extensively. Patient's son does the majority of the cooking for her. When she does eat out, she will typically order a salad. Patient typically eats two main meals daily (breakfast and dinner).   Patient denies hypoglycemia. Previously symptoms of lows include: lightheaded, dizziness, passed out.  Patient is currently not checking her blood sugars. A new order was sent into [facility] but she has not received it yet.   Reports insulin is expensive and there are times where she is forced to ration her insulin or delay orders of insulin due to cost.  Patient would likely benefit from starting Ozempic (with PAP) and decreasing Humalog due to lower risk of hypogylcemia and cardiovascular protection compared to Humalog. However, prefer to wait until blood sugar readings available to assess prior to making changes.   Plan  Continue current medications  Patient to contact [facility] too see about delivery of testing supplies.    Will start PAP for Toujeo.   Hypertension   BP today is: n/a  Office blood pressures are  BP Readings from Last 3 Encounters:  11/16/19 126/60  10/16/18 (!) 172/82  09/04/18 (!) 142/76   CMP Latest Ref Rng & Units 11/16/2019 04/01/2019 10/16/2018  Glucose 70 - 99 mg/dL 83 105(H) 253(H)  BUN 6 - 23 mg/dL _0 Creatinine 0.40 - 1.20 mg/dL 1.31(H) 1.47(H) 1.47(H)  Sodium 135 - 145 mEq/L 138 139 139  Potassium 3.5 - 5.1 mEq/L 4.0 3.7 4.5  Chloride 96 - 112 mEq/L 104 103 103  CO2 19 - 32 mEq/L _1 Calcium 8.4 - 10.5 mg/dL 8.9 9.4 9.3  Total Protein 6.0 - 8.3 g/dL - - -  Total Bilirubin 0.2 - 1.2 mg/dL - - -  Alkaline Phos 39 - 117 U/L - - -  AST 0 - 37 U/L - 21 -  ALT 0 - 35 U/L - 16 -  CrCl 37 mL/min based on Cr of 1.31 and Adj wt of  62kg.  Patient has failed these meds in the past: amlodipine, losartan, lisinopril Patient is currently controlled on the following medications: triamterene-HCTZ 37.5-25 mg daily  BP goal <140/90  Patient checks BP at home never  Patient home BP readings are ranging: n/a  We discussed Patient drinks 7-10 cups of coffee (black) day. Likes the taste. She reports she often starts drinking at [facility] and drinks frequently throughout the day. She has been drinking more alkaline water but does not like the taste. We discussed the impact of caffeine on blood pressure, kidney function, and fatigue.   Plan  Continue current medications.    Hyperlipidemia   Lipid Panel     Component Value Date/Time   CHOL 132 04/01/2019 0825   TRIG 80.0 04/01/2019 0825   HDL 52.40 04/01/2019 0825   CHOLHDL 3 04/01/2019 0825   VLDL 16.0 04/01/2019 0825   LDLCALC 64 04/01/2019 0825   LDLDIRECT 109.0 09/20/2015 0750     The 10-year ASCVD risk score Mikey Bussing DC Jr., et al., 2013) is: 20.8%   Values used to calculate the score:     Age: 15 years     Sex: Female     Is Non-Hispanic African American: Yes     Diabetic: Yes     Tobacco smoker: No     Systolic Blood Pressure: 093 mmHg     Is BP treated: Yes     HDL Cholesterol: 52.4 mg/dL     Total Cholesterol: 132 mg/dL   Patient has failed these meds in past: n/a Patient is currently controlled on the following medications: atorvastatin 10 mg daily LDL goal < 100   We discussed:  Patient tolerating atorvastatin well.  Plan  Continue current medications  Depression/Anxiety   PHQ9 SCORE ONLY 12/02/2019 04/14/2019 09/16/2017  PHQ-9 Total Score 11 0 0   Patient has failed these meds in past: paroxetine, venlafaxine Patient is currently controlled on the following medications:   Escitalopram 20 mg daily  Duloxetine 60 mg   We discussed: Patient states mood has improved, and feels like she is getting more sleep, closer to 4 hours nightly. She still has  trouble falling asleep. She is continuing to take escitalopram  Back pain not hurting as much, feels like her mood has improved. Getting more sleep. 4 hours consistently. Counseled patient on proper sleep hygiene.   Plan  Per PCP note on 12/02/19, decrease Escitalopram to 10 mg daily for  4 days, then stop Escitalopram.   Chronic Pain/Neuropathy   Patient has failed these meds in past: cyclobenzaprine, tizanidine, methocarbamol Patient is currently managed on the following medications:   Gabapentin 600 mg TID  Duloxetine 60 mg daily  We discussed:  Patient receives shots every 3 months for pain. Last shot on 10/15/19. Since increasing Duloxetine, patient feels back pain has lessened significantly, although she still has pain on days where she exerts herself more. She denies symptoms of CNS depression or oversedation.   Plan  Given patients kidney function, maximum recommended gabapentin is 900 mg daily. Patient slightly over but appears to be tolerating well with no SEs.   Recommend Continuing current medications and continuing to monitor for signs of SEs.  Misc/OTC   Biotin 5000 mcg daily-hair, unsure if she sees any benefit.  Ferrous sulfate 325 mg daily Multivitamin daily Proventil HFA 167mg/act 2 puff q6hr PRN Hydroxyzine 12.5-25 mg q8hr PRN itching- Uses daily for itching.   Plan  Continue current medications  Vaccines   Reviewed and discussed patient's vaccination history.    Immunization History  Administered Date(s) Administered  . Influenza Split 05/21/2011, 06/18/2012  . Influenza Whole 05/12/2013  . Influenza, High Dose Seasonal PF 04/23/2016, 05/15/2017, 03/31/2018  . Influenza,inj,Quad PF,6+ Mos 03/21/2014  . Influenza-Unspecified 04/29/2015  . PFIZER SARS-COV-2 Vaccination 10/01/2019, 10/27/2019  . Pneumococcal Conjugate-13 03/21/2014  . Pneumococcal Polysaccharide-23 05/21/2011    Plan  Recommend patient receive Shingrix vaccine  Medication Management    Pt uses [facility] pharmacy for most medications. Dispensing report showed >5 day gap on fill of:  Atorvastatin (LF 09/01/19 for 90DS; 12/14/19 90DS)  Triamterene-HCTZ (LF 09/01/19 for 90DS; 12/14/19 90DS)  Metformin (10/14/19 for 90DS)  We discussed:  Importance of daily adherence to chronic medications  Follow up:  1 month  [phone]

## 2020-01-06 NOTE — Patient Instructions (Addendum)
Visit Information  Goals Addressed            This Visit's Progress   . Chronic Care Management       CARE PLAN ENTRY  Current Barriers:  . Chronic Disease Management support, education, and care coordination needs related to Hypertension, Hyperlipidemia, Diabetes, Depression, and Chronic Pain   Hypertension . Pharmacist Clinical Goal(s): o Over the next 90 days, patient will work with PharmD and providers to achieve BP goal <140/90 . Current regimen:  o triamterene-HCTZ 37.5-25 mg daily . Interventions: o Increase activity level slowly  aiming for 150 minutes of moderate intensity (enough to get your heart rate up slightly) activity every week  . Patient self care activities - Over the next 90 days, patient will: o Check blood pressure weekly, document, and provide at future appointments o Ensure daily salt intake < 2300 mg/day  Hyperlipidemia . Pharmacist Clinical Goal(s): o Over the next 90 days, patient will work with PharmD and providers to maintain LDL goal < 100 . Current regimen:  o Atorvastatin 10 mg  Diabetes . Pharmacist Clinical Goal(s): o Over the next 90 days, patient will work with PharmD and providers to maintain A1c goal  <7.5% . Current regimen:  o Metformin 1000 mg twice daily o Toujeo 24 units daily o Humalog 8 units twice daily Interventions: o Pharmacist to begin working on [employer] administrator for [employer] o Patient will contact [facility] to see about getting testing supplies delivered . Patient self care activities - Over the next 90 days, patient will: o Check blood sugar twice daily (before breakfast and before dinner), document, and provide at future appointments o Contact provider with any episodes of hypoglycemia o Obtain a copy of your proof of income. You can use tax returns, social security statement.  Medication management . Pharmacist Clinical Goal(s): o Over the next 90 days, patient will work with PharmD and providers to  maintain optimal medication adherence . Current pharmacy: [facility] . Interventions o Comprehensive medication review performed. o Continue current medication management strategy o For the next 4 days, take Escitalopram 10 mg (1/2 tablet) daily, then stop altogether . Patient self care activities - Over the next 90 days, patient will: o Take medications as prescribed o Report any questions or concerns to PharmD and/or provider(s)       The patient verbalized understanding of instructions provided today and agreed to receive a mailed copy of patient instruction and/or educational materials.  Telephone follow up appointment with pharmacy team member scheduled for:  Doristine Section Clinical Pharmacist Velora Heckler at [facility]  [phone]

## 2020-01-07 ENCOUNTER — Telehealth: Payer: Self-pay

## 2020-01-07 DIAGNOSIS — E083299 Diabetes mellitus due to underlying condition with mild nonproliferative diabetic retinopathy without macular edema, unspecified eye: Secondary | ICD-10-CM

## 2020-01-07 DIAGNOSIS — Z794 Long term (current) use of insulin: Secondary | ICD-10-CM

## 2020-01-07 NOTE — Telephone Encounter (Signed)
Patient unable to get Freestyle test strips through [facility] and was worried about the cost of those supplies for her. She requests we send in a new glucose testing kit (monitor, test strips, and lancets) so she may resume checking her blood sugars. Can we ask the pharmacy to dispense whichever kit will be cheapest for her insurance? Please send this into [facility] on [address].  Thanks, Doristine Section Clinical Pharmacist Velora Heckler at [facility]  [phone]

## 2020-01-12 MED ORDER — BLOOD GLUCOSE MONITOR KIT: PACK | 0 refills | Status: DC

## 2020-01-12 NOTE — Addendum Note (Signed)
Addended by: Ronnald Nian on: 01/12/2020 09:58 AM   Modules accepted: Orders

## 2020-01-12 NOTE — Telephone Encounter (Signed)
Rx done and will fax to [facility]

## 2020-01-12 NOTE — Telephone Encounter (Signed)
Rx faxed today 

## 2020-01-29 ENCOUNTER — Telehealth: Payer: Self-pay | Admitting: Family Medicine

## 2020-02-10 ENCOUNTER — Telehealth: Payer: MEDICARE

## 2020-02-14 ENCOUNTER — Other Ambulatory Visit: Payer: Self-pay | Admitting: Family Medicine

## 2020-02-14 NOTE — Telephone Encounter (Signed)
Last OV 12/02/19 Last fill for both meds 03/19/19  #90/3

## 2020-02-27 NOTE — Telephone Encounter (Signed)
Received call from [facility]. Jason from EMS called to report patient was found dead in her home at 11 am. No indication of foul play. Patient had been dead about 12 hours. They will send death certificate to be signed.

## 2020-02-27 DEATH — deceased

## 2020-03-09 ENCOUNTER — Other Ambulatory Visit: Payer: Self-pay | Admitting: Family Medicine

## 2020-03-09 DIAGNOSIS — E118 Type 2 diabetes mellitus with unspecified complications: Secondary | ICD-10-CM

## 2020-04-08 ENCOUNTER — Other Ambulatory Visit: Payer: Self-pay | Admitting: Family Medicine

## 2020-04-24 ENCOUNTER — Other Ambulatory Visit: Payer: Self-pay | Admitting: Family Medicine

## 2020-05-22 ENCOUNTER — Telehealth: Payer: Self-pay | Admitting: Family Medicine

## 2020-05-22 NOTE — Telephone Encounter (Signed)
Patient number not in service 

## 2020-07-17 ENCOUNTER — Telehealth: Payer: Self-pay | Admitting: Family Medicine

## 2020-07-17 NOTE — Telephone Encounter (Signed)
Attempted to schedule AWV. Unable to LVM.  Will try at later time.   Called patient to schedule Annual Wellness Visit.  Please schedule with Nurse Health Advisor Martha Stanley, RN at [facility]  

## 2021-06-01 ENCOUNTER — Encounter: Payer: Self-pay | Admitting: Internal Medicine
